# Patient Record
Sex: Female | Born: 1965 | Race: White | Hispanic: No | Marital: Single | Smoking: Never smoker
Health system: Southern US, Community
[De-identification: ages and names within clinical notes are randomized; demographics above are authoritative.]

## PROBLEM LIST (undated history)

## (undated) DIAGNOSIS — D72819 Decreased white blood cell count, unspecified: Secondary | ICD-10-CM

## (undated) DIAGNOSIS — D696 Thrombocytopenia, unspecified: Secondary | ICD-10-CM

## (undated) DIAGNOSIS — F988 Other specified behavioral and emotional disorders with onset usually occurring in childhood and adolescence: Secondary | ICD-10-CM

## (undated) DIAGNOSIS — F79 Unspecified intellectual disabilities: Secondary | ICD-10-CM

## (undated) DIAGNOSIS — E871 Hypo-osmolality and hyponatremia: Secondary | ICD-10-CM

## (undated) DIAGNOSIS — R569 Unspecified convulsions: Secondary | ICD-10-CM

## (undated) DIAGNOSIS — E669 Obesity, unspecified: Secondary | ICD-10-CM

## (undated) DIAGNOSIS — G40909 Epilepsy, unspecified, not intractable, without status epilepticus: Secondary | ICD-10-CM

## (undated) DIAGNOSIS — B351 Tinea unguium: Secondary | ICD-10-CM

## (undated) HISTORY — DX: Unspecified intellectual disabilities: F79

## (undated) HISTORY — DX: Thrombocytopenia, unspecified: D69.6

## (undated) HISTORY — DX: Tinea unguium: B35.1

## (undated) HISTORY — DX: Obesity, unspecified: E66.9

## (undated) HISTORY — DX: Decreased white blood cell count, unspecified: D72.819

## (undated) HISTORY — PX: VAGINAL HYSTERECTOMY: SHX2639

## (undated) HISTORY — PX: ABDOMINAL HYSTERECTOMY: SHX81

## (undated) HISTORY — DX: Epilepsy, unspecified, not intractable, without status epilepticus: G40.909

## (undated) HISTORY — DX: Hypo-osmolality and hyponatremia: E87.1

## (undated) HISTORY — DX: Other specified behavioral and emotional disorders with onset usually occurring in childhood and adolescence: F98.8

---

## 2003-04-07 ENCOUNTER — Inpatient Hospital Stay (HOSPITAL_COMMUNITY): Admission: EM | Admit: 2003-04-07 | Discharge: 2003-04-11 | Payer: Self-pay | Admitting: Internal Medicine

## 2006-06-13 ENCOUNTER — Ambulatory Visit: Payer: Self-pay | Admitting: *Deleted

## 2007-05-09 ENCOUNTER — Ambulatory Visit: Payer: Self-pay | Admitting: *Deleted

## 2008-05-30 ENCOUNTER — Ambulatory Visit: Payer: Self-pay | Admitting: *Deleted

## 2009-05-01 ENCOUNTER — Ambulatory Visit: Payer: Self-pay | Admitting: Dentistry

## 2009-05-08 ENCOUNTER — Ambulatory Visit: Payer: Self-pay | Admitting: Dentistry

## 2009-06-01 ENCOUNTER — Ambulatory Visit: Payer: Self-pay | Admitting: Family Medicine

## 2010-06-29 ENCOUNTER — Ambulatory Visit: Payer: Self-pay | Admitting: Family Medicine

## 2010-10-21 ENCOUNTER — Ambulatory Visit: Payer: Self-pay | Admitting: Family Medicine

## 2011-03-24 ENCOUNTER — Ambulatory Visit: Payer: Self-pay | Admitting: Family Medicine

## 2011-07-05 ENCOUNTER — Ambulatory Visit: Payer: Self-pay | Admitting: Family Medicine

## 2011-11-13 ENCOUNTER — Emergency Department: Payer: Self-pay | Admitting: Emergency Medicine

## 2011-11-13 LAB — CBC
HGB: 14.1 g/dL (ref 12.0–16.0)
MCH: 31.9 pg (ref 26.0–34.0)
MCHC: 34 g/dL (ref 32.0–36.0)
MCV: 94 fL (ref 80–100)
Platelet: 86 10*3/uL — ABNORMAL LOW (ref 150–440)
RBC: 4.42 10*6/uL (ref 3.80–5.20)

## 2011-11-13 LAB — COMPREHENSIVE METABOLIC PANEL
Albumin: 3.5 g/dL (ref 3.4–5.0)
Alkaline Phosphatase: 91 U/L (ref 50–136)
BUN: 17 mg/dL (ref 7–18)
Bilirubin,Total: 0.7 mg/dL (ref 0.2–1.0)
Chloride: 105 mmol/L (ref 98–107)
Co2: 26 mmol/L (ref 21–32)
EGFR (African American): >60
EGFR (Non-African Amer.): >60
Glucose: 96 mg/dL (ref 65–99)
Osmolality: 286 (ref 275–301)
SGOT(AST): 18 U/L (ref 15–37)
SGPT (ALT): 26 U/L
Total Protein: 6.4 g/dL (ref 6.4–8.2)

## 2011-11-14 LAB — URINALYSIS, COMPLETE
Bacteria: NONE SEEN
Bilirubin,UR: NEGATIVE
Ketone: NEGATIVE
Leukocyte Esterase: NEGATIVE
Ph: 5 (ref 4.5–8.0)
Protein: NEGATIVE
RBC,UR: NONE SEEN /HPF (ref 0–5)
WBC UR: 4 /HPF (ref 0–5)

## 2012-01-12 ENCOUNTER — Ambulatory Visit: Payer: Self-pay | Admitting: Dentistry

## 2012-07-06 ENCOUNTER — Ambulatory Visit: Payer: Self-pay | Admitting: Family Medicine

## 2013-07-08 ENCOUNTER — Ambulatory Visit: Payer: Self-pay | Admitting: Family Medicine

## 2013-07-15 ENCOUNTER — Encounter: Payer: Self-pay | Admitting: Nurse Practitioner

## 2013-07-22 ENCOUNTER — Ambulatory Visit (INDEPENDENT_AMBULATORY_CARE_PROVIDER_SITE_OTHER): Payer: Medicare Other | Admitting: Nurse Practitioner

## 2013-07-22 ENCOUNTER — Encounter: Payer: Self-pay | Admitting: Nurse Practitioner

## 2013-07-22 VITALS — BP 94/67 | HR 84 | Ht 60.0 in | Wt 140.0 lb

## 2013-07-22 DIAGNOSIS — G40319 Generalized idiopathic epilepsy and epileptic syndromes, intractable, without status epilepticus: Secondary | ICD-10-CM | POA: Insufficient documentation

## 2013-07-22 DIAGNOSIS — Z5181 Encounter for therapeutic drug level monitoring: Secondary | ICD-10-CM | POA: Insufficient documentation

## 2013-07-22 DIAGNOSIS — F79 Unspecified intellectual disabilities: Secondary | ICD-10-CM | POA: Insufficient documentation

## 2013-07-22 DIAGNOSIS — Z79899 Other long term (current) drug therapy: Secondary | ICD-10-CM

## 2013-07-22 NOTE — Progress Notes (Signed)
[facility]  PATIENT: Roberta Hampton DOB: 1966-05-06   REASON FOR VISIT: Followup for seizure disorder   HISTORY OF PRESENT ILLNESS: Roberta Hampton, 47 year old  right-handed white female with a history of mental retardation and seizures returns for followup. The patient has done quite well with the seizures, without any recurrence since last seen. The patient is on low-dose Depakene, and she is tolerating this quite well. She has not had recent labs according to the caregiver that is with her from the group home.  REVIEW OF SYSTEMS: Full 14 system review of systems performed and notable only for:  Constitutional: N/A  Cardiovascular: N/A  Ear/Nose/Throat: N/A  Skin: N/A  Eyes: N/A  Respiratory: N/A  Gastroitestinal: N/A  Hematology/Lymphatic: N/A  Endocrine: N/A Musculoskeletal:N/A  Allergy/Immunology: N/A  Neurological: N/A Psychiatric: N/A   ALLERGIES: No Known Allergies  HOME MEDICATIONS: Outpatient Prescriptions Prior to Visit  Medication Sig Dispense Refill  . ARIPiprazole (ABILIFY) 30 MG tablet Take 30 mg by mouth daily.      . Calcium Carb-Cholecalciferol (CALCIUM 600 + D PO) Take 600 mg by mouth 2 (two) times daily.      Marland Kitchen docusate (COLACE) 50 MG/5ML liquid Take 50 mg by mouth 2 (two) times daily. One teaspoon      . fluvoxaMINE (LUVOX) 50 MG tablet Take 50 mg by mouth at bedtime. 2 tablets      . ibuprofen (ADVIL,MOTRIN) 100 MG/5ML suspension Take 200 mg by mouth every 6 (six) hours as needed. 4 teaspoons as needed for joint pain      . loratadine (CLARITIN) 5 MG/5ML syrup Take by mouth daily. 2 teaspoons      . Multiple Vitamin (MULTIVITAMIN) tablet Take 1 tablet by mouth daily.      . mupirocin ointment (BACTROBAN) 2 % Apply 1 application topically. Apply 3 times for 10 days for infection      . polyethylene glycol (MIRALAX / GLYCOLAX) packet Take 17 g by mouth daily.      . QUEtiapine (SEROQUEL) 300 MG tablet Take 300 mg by mouth at bedtime. 2  tablets      . valproate (DEPAKENE) 250 MG/5ML syrup Take 250 mg by mouth 2 (two) times daily. 1 teaspoon       No facility-administered medications prior to visit.    PAST MEDICAL HISTORY: Past Medical History  Diagnosis Date  . Seizure disorder   . Hyponatremia     associated with polydipsia  . Obesity   . Mental retardation   . Attention deficit disorder     PAST SURGICAL HISTORY: Past Surgical History  Procedure Laterality Date  . Vaginal hysterectomy      FAMILY HISTORY: Family History  Problem Relation Age of Onset  . Asthma Brother   . Seizures      paternal cousin    SOCIAL HISTORY: History   Social History  . Marital Status: Single    Spouse Name: N/A    Number of Children: 0  . Years of Education: N/A   Occupational History  . Not on file.   Social History Main Topics  . Smoking status: Never Smoker   . Smokeless tobacco: Never Used  . Alcohol Use: No  . Drug Use: No  . Sexual Activity: Not on file   Other Topics Concern  . Not on file   Social History Narrative   Patient is single and lives in a group home.   Patient does not have any children.  PHYSICAL EXAM  Filed Vitals:   07/22/13 1039  BP: 94/67  Pulse: 84  Height: 5' (1.524 m)  Weight: 140 lb (63.504 kg)   Body mass index is 27.34 kg/(m^2).  Generalized: Well developed, in no acute distress   Neurological examination   Mentation: Alert  MR.  Speech is slightly dysarthric  Cranial nerve II-XII: Visual fields were full on confrontational test. Extraocular movements are full Facial sensation and strength were normal. hearing was intact to finger rubbing bilaterally. Uvula tongue midline. head turning and shoulder shrug and were normal and symmetric. Motor: normal bulk and tone, full strength in the BUE, BLE, Coordination: finger-nose-finger, heel-to-shin bilaterally, the patient does have some apraxia with the lower extremities  Reflexes: Brachioradialis 2/2, biceps 2/2,  triceps 2/2, patellar 2/2, Achilles 2/2, plantar responses were flexor bilaterally. Gait and Station: Rising up from seated position without assistance, wide based stance,  moderate stride, good arm swing, smooth turning, un able to perform tiptoe, and heel walking without difficulty. Tandem gait ataxic  DIAGNOSTIC DATA (LABS, IMAGING, TESTING) None to review    ASSESSMENT AND PLAN  47 y.o. year old female  has a past medical history of Seizure disorder;   Mental retardation; and Attention deficit disorder. here to followup for seizure disorder. She is currently on Depakene with no seizures in several years  Paperwork for group home completed CBC, CMP today Depakene refills for one year per there form Followup yearly  and when necessary Roberta Hampton, [facility], [facility], APRN  [facility] [address], [address] [place], [place] [address] [phone]

## 2013-07-22 NOTE — Progress Notes (Signed)
I have read the note, and I agree with the clinical assessment and plan.  WILLIS,CHARLES KEITH   

## 2013-07-22 NOTE — Patient Instructions (Signed)
Per group home sheet 

## 2013-07-23 LAB — CBC WITH DIFFERENTIAL/PLATELET
Basophils Absolute: 0 10*3/uL (ref 0.0–0.2)
Basos: 1 %
Eosinophils Absolute: 0.1 10*3/uL (ref 0.0–0.4)
HCT: 44.1 % (ref 34.0–46.6)
Hemoglobin: 15.4 g/dL (ref 11.1–15.9)
Lymphocytes Absolute: 2.2 10*3/uL (ref 0.7–3.1)
Lymphs: 37 %
MCH: 31.6 pg (ref 26.6–33.0)
MCHC: 34.9 g/dL (ref 31.5–35.7)
MCV: 90 fL (ref 79–97)
Monocytes Absolute: 0.5 10*3/uL (ref 0.1–0.9)
Monocytes: 9 %
RDW: 12.9 % (ref 12.3–15.4)
WBC: 5.9 10*3/uL (ref 3.4–10.8)

## 2013-07-23 LAB — COMPREHENSIVE METABOLIC PANEL
ALT: 8 IU/L (ref 0–32)
AST: 11 IU/L (ref 0–40)
BUN/Creatinine Ratio: 28 — ABNORMAL HIGH (ref 9–23)
CO2: 35 mmol/L — ABNORMAL HIGH (ref 18–29)
Calcium: 10.3 mg/dL — ABNORMAL HIGH (ref 8.7–10.2)
Chloride: 100 mmol/L (ref 96–108)
Creatinine, Ser: 0.69 mg/dL (ref 0.57–1.00)
Glucose: 71 mg/dL (ref 65–99)
Potassium: 4.2 mmol/L (ref 3.5–5.2)
Sodium: 142 mmol/L (ref 134–144)
Total Bilirubin: 0.6 mg/dL (ref 0.0–1.2)
Total Protein: 6.1 g/dL (ref 6.0–8.5)

## 2013-07-23 NOTE — Progress Notes (Signed)
Quick Note:  Spoke to [place] at group home and gave normal labs, per [place]. ______

## 2014-01-15 ENCOUNTER — Emergency Department: Payer: Self-pay | Admitting: Emergency Medicine

## 2014-03-05 ENCOUNTER — Emergency Department: Payer: Self-pay | Admitting: Emergency Medicine

## 2014-07-09 ENCOUNTER — Ambulatory Visit: Payer: Self-pay

## 2014-07-21 ENCOUNTER — Ambulatory Visit (INDEPENDENT_AMBULATORY_CARE_PROVIDER_SITE_OTHER): Payer: Medicare Other | Admitting: Neurology

## 2014-07-21 ENCOUNTER — Encounter: Payer: Self-pay | Admitting: Neurology

## 2014-07-21 VITALS — BP 110/80 | HR 90 | Wt 152.0 lb

## 2014-07-21 DIAGNOSIS — G40311 Generalized idiopathic epilepsy and epileptic syndromes, intractable, with status epilepticus: Secondary | ICD-10-CM

## 2014-07-21 DIAGNOSIS — F79 Unspecified intellectual disabilities: Secondary | ICD-10-CM

## 2014-07-21 DIAGNOSIS — G40319 Generalized idiopathic epilepsy and epileptic syndromes, intractable, without status epilepticus: Secondary | ICD-10-CM

## 2014-07-21 DIAGNOSIS — Z5181 Encounter for therapeutic drug level monitoring: Secondary | ICD-10-CM

## 2014-07-21 MED ORDER — VALPROATE SODIUM 250 MG/5ML PO SYRP: 250.0000 mg | ORAL_SOLUTION | Freq: Two times a day (BID) | ORAL | Status: DC

## 2014-07-21 NOTE — Progress Notes (Signed)
Reason for visit: seizures  Roberta Hampton is an 48 y.o. female  History of present illness:  Roberta Hampton is a 48 year old right-handed white female with history of mental retardation and epilepsy. The patient is on Depakene, and she is tolerating medication well. The patient has not had any noted seizures since last seen one year ago. The patient does have some mild gait instability, and she may fall on occasion. The patient otherwise is eating and sleeping fairly well. She has not had any other significant medical issues that have come up since last seen. She returns for routine reevaluation.  Past Medical History  Diagnosis Date  . Seizure disorder   . Hyponatremia     associated with polydipsia  . Obesity   . Mental retardation   . Attention deficit disorder     Past Surgical History  Procedure Laterality Date  . Vaginal hysterectomy      Family History  Problem Relation Age of Onset  . Asthma Brother   . Seizures      paternal cousin    Social history:  reports that she has never smoked. She has never used smokeless tobacco. She reports that she does not drink alcohol or use illicit drugs.   No Known Allergies  Medications:  Current Outpatient Prescriptions on File Prior to Visit  Medication Sig Dispense Refill  . acetaminophen (TYLENOL) 160 MG/5ML elixir Take 15 mg/kg by mouth every 4 (four) hours as needed for fever (4 teaspoons every 6 hours as needed).    . Calcium Carb-Cholecalciferol (CALCIUM 600 + D PO) Take 600 mg by mouth 2 (two) times daily.    Marland Kitchen docusate (COLACE) 50 MG/5ML liquid Take 50 mg by mouth 2 (two) times daily. One teaspoon    . ibuprofen (ADVIL,MOTRIN) 100 MG/5ML suspension Take 200 mg by mouth every 6 (six) hours as needed. 4 teaspoons as needed for joint pain    . lisdexamfetamine (VYVANSE) 60 MG capsule Take 60 mg by mouth every morning.    . loratadine (CLARITIN) 5 MG/5ML syrup Take by mouth daily. 2 teaspoons    . Multiple Vitamin  (MULTIVITAMIN) tablet Take 1 tablet by mouth daily.    . polyethylene glycol (MIRALAX / GLYCOLAX) packet Take 17 g by mouth daily.    . QUEtiapine (SEROQUEL) 300 MG tablet Take 300 mg by mouth at bedtime. 2 tablets     No current facility-administered medications on file prior to visit.    ROS:  Out of a complete 14 system review of symptoms, the patient complains only of the following symptoms, and all other reviewed systems are negative.  Behavior problems, agitation History of seizures Runny nose, difficulty swallowing  Blood pressure 110/80, pulse 90, weight 152 lb (68.947 kg).  Physical Exam  General: The patient is alert and cooperative at the time of the examination.  Skin: No significant peripheral edema is noted.   Neurologic Exam  Mental status: The patient is oriented x 3.  Cranial nerves: Facial symmetry is present. Speech is normal, no aphasia or dysarthria is noted. Extraocular movements are full. Visual fields are full to threat.  Motor: The patient has good strength in all 4 extremities.  Sensory examination: Sensory testing is difficult, the patient does not respond appropriately.  Coordination: The patient has no obvious dysmetria with the extremities, she will not follow commands well for finger-nose-finger and heel-to-shin testing.  Gait and station: The patient has a slightly wide-based gait.  Reflexes: Deep tendon reflexes are  symmetric.   Assessment/Plan:  1. History seizures  2. Mental retardation  The patient is doing fairly well with her seizures. She will continue the Depakene. We will check blood work today, and she will follow-up in one year. A prescription was sent in for the Depakene.  Jill Alexanders MD 07/21/2014 10:09 AM  [facility] [address] [place], [place] [address]  Phone [phone] Fax [phone]

## 2014-07-21 NOTE — Addendum Note (Signed)
Addended by: Lolita Cram T on: 07/21/2014 10:47 AM   Modules accepted: Medications

## 2014-07-22 ENCOUNTER — Telehealth: Payer: Self-pay | Admitting: Neurology

## 2014-07-22 LAB — COMPREHENSIVE METABOLIC PANEL
ALBUMIN: 4.4 g/dL (ref 3.5–5.5)
ALK PHOS: 79 IU/L (ref 39–117)
ALT: 8 IU/L (ref 0–32)
AST: 11 IU/L (ref 0–40)
Albumin/Globulin Ratio: 2.8 — ABNORMAL HIGH (ref 1.1–2.5)
BILIRUBIN TOTAL: 0.7 mg/dL (ref 0.0–1.2)
BUN / CREAT RATIO: 27 — AB (ref 9–23)
BUN: 21 mg/dL (ref 6–24)
CHLORIDE: 98 mmol/L (ref 97–108)
CO2: 28 mmol/L (ref 18–29)
Calcium: 10.1 mg/dL (ref 8.7–10.2)
Creatinine, Ser: 0.77 mg/dL (ref 0.57–1.00)
GFR calc Af Amer: 106 mL/min/{1.73_m2} (ref >59)
GFR calc non Af Amer: 92 mL/min/{1.73_m2} (ref >59)
Globulin, Total: 1.6 g/dL (ref 1.5–4.5)
Glucose: 64 mg/dL — ABNORMAL LOW (ref 65–99)
Potassium: 4.2 mmol/L (ref 3.5–5.2)
SODIUM: 142 mmol/L (ref 134–144)
Total Protein: 6 g/dL (ref 6.0–8.5)

## 2014-07-22 LAB — CBC WITH DIFFERENTIAL
BASOS ABS: 0 10*3/uL (ref 0.0–0.2)
Basos: 0 %
Eos: 1 %
Eosinophils Absolute: 0.1 10*3/uL (ref 0.0–0.4)
HCT: 48.3 % — ABNORMAL HIGH (ref 34.0–46.6)
Hemoglobin: 16.3 g/dL — ABNORMAL HIGH (ref 11.1–15.9)
IMMATURE GRANULOCYTES: 0 %
Immature Grans (Abs): 0 10*3/uL (ref 0.0–0.1)
Lymphocytes Absolute: 1.7 10*3/uL (ref 0.7–3.1)
Lymphs: 29 %
MCH: 31.7 pg (ref 26.6–33.0)
MCHC: 33.7 g/dL (ref 31.5–35.7)
MCV: 94 fL (ref 79–97)
MONOCYTES: 7 %
MONOS ABS: 0.4 10*3/uL (ref 0.1–0.9)
Neutrophils Absolute: 3.5 10*3/uL (ref 1.4–7.0)
Neutrophils Relative %: 63 %
PLATELETS: 122 10*3/uL — AB (ref 150–379)
RBC: 5.14 x10E6/uL (ref 3.77–5.28)
RDW: 13.6 % (ref 12.3–15.4)
WBC: 5.8 10*3/uL (ref 3.4–10.8)

## 2014-07-22 LAB — VALPROIC ACID LEVEL: VALPROIC ACID LVL: 109 ug/mL — AB (ref 50–100)

## 2014-07-22 NOTE — Telephone Encounter (Signed)
I called concerning the blood work. The blood sugar was minimally low, platelets were slightly low, hemoglobin is slightly high. The patient has a good Depakote level, overall, the blood work shows no severe abnormalities.

## 2014-10-24 ENCOUNTER — Emergency Department: Payer: Self-pay | Admitting: Emergency Medicine

## 2014-10-27 ENCOUNTER — Telehealth: Payer: Self-pay | Admitting: *Deleted

## 2014-10-27 DIAGNOSIS — Z5181 Encounter for therapeutic drug level monitoring: Secondary | ICD-10-CM

## 2014-10-27 NOTE — Telephone Encounter (Signed)
Events noted, I will see the patient tomorrow.

## 2014-10-27 NOTE — Telephone Encounter (Signed)
Alcorya with the patients group home is calling stating that the patient been having seizures. The patient has not had any seizures in a long time but it just started back. The would like to know should the patient come in for an appt of adjust meds. Please call [phone]..... Willis out today

## 2014-10-27 NOTE — Telephone Encounter (Signed)
Ms. Roberta Hampton is calling back stating that we need to fax a authoraztion form to [facility] to get labs results.  Their fax number  Is [phone].

## 2014-10-27 NOTE — Telephone Encounter (Signed)
Faxed form to [facility]. Waiting on results. Informed Ms. Jeneen Rinks per NP-Carolyn patient will see Dr. Jannifer Franklin on 10/28/14. Ms. Jeneen Rinks agreed.

## 2014-10-27 NOTE — Telephone Encounter (Signed)
Please call group home and ask that we check level of Depakene first thing in the morning before morning dose. I am assuming they have already ruled out UTI or other type of infection with PCP. Orders in the system.

## 2014-10-27 NOTE — Telephone Encounter (Signed)
Roberta Hampton said she took patient to ED @ [facility] on 10/24/14 her Depakene levels were good. They advised patient to f/u with PCP or Neurologist. PCP advised f/u with neurologist. Julius Bowels with have [place] to fax lab results. Please advise.

## 2014-10-28 ENCOUNTER — Ambulatory Visit (INDEPENDENT_AMBULATORY_CARE_PROVIDER_SITE_OTHER): Payer: Medicare Other | Admitting: Neurology

## 2014-10-28 ENCOUNTER — Telehealth: Payer: Self-pay | Admitting: Neurology

## 2014-10-28 ENCOUNTER — Encounter: Payer: Self-pay | Admitting: Neurology

## 2014-10-28 VITALS — BP 93/71 | HR 92 | Ht 60.0 in | Wt 159.4 lb

## 2014-10-28 DIAGNOSIS — F79 Unspecified intellectual disabilities: Secondary | ICD-10-CM | POA: Diagnosis not present

## 2014-10-28 DIAGNOSIS — G40311 Generalized idiopathic epilepsy and epileptic syndromes, intractable, with status epilepticus: Secondary | ICD-10-CM | POA: Diagnosis not present

## 2014-10-28 DIAGNOSIS — G40319 Generalized idiopathic epilepsy and epileptic syndromes, intractable, without status epilepticus: Secondary | ICD-10-CM

## 2014-10-28 MED ORDER — VALPROATE SODIUM 250 MG/5ML PO SYRP: 375.0000 mg | ORAL_SOLUTION | Freq: Two times a day (BID) | ORAL | Status: DC

## 2014-10-28 NOTE — Telephone Encounter (Signed)
Roberta Hampton with [employer] @ [phone], questioning if dosage has been increased for Rx valproate (DEPAKENE) 250 MG/5ML syrup.  If so need DC with previous order.  Please call and advise.

## 2014-10-28 NOTE — Patient Instructions (Signed)

## 2014-10-28 NOTE — Progress Notes (Signed)
Reason for visit: Seizures  DAI APEL is an 49 y.o. female  History of present illness:  Ms. Verlene Mayer is a 49 year old right-handed white female with a history of mental retardation and seizures. She has done well for a number of years without recurring seizures. The patient unfortunately has had several seizures that occurred on February 19 and on February 20. The patient has staring episodes associated with her seizures associated with some slumping. She had a recent Depakote level that was done at 11:30 AM following her morning dose of medication with a level of 65. Normally she runs a level of around 110. The patient is back to her baseline, she comes to the office today for an evaluation.  Past Medical History  Diagnosis Date  . Seizure disorder   . Hyponatremia     associated with polydipsia  . Obesity   . Mental retardation   . Attention deficit disorder     Past Surgical History  Procedure Laterality Date  . Vaginal hysterectomy      Family History  Problem Relation Age of Onset  . Asthma Brother   . Seizures      paternal cousin    Social history:  reports that she has never smoked. She has never used smokeless tobacco. She reports that she does not drink alcohol or use illicit drugs.   No Known Allergies  Medications:  Current Outpatient Prescriptions on File Prior to Visit  Medication Sig Dispense Refill  . acetaminophen (TYLENOL) 160 MG/5ML elixir Take 15 mg/kg by mouth every 4 (four) hours as needed for fever (4 teaspoons every 6 hours as needed).    Marland Kitchen antipyrine-benzocaine (AURALGAN) otic solution Place 3-4 drops into the left ear every 2 (two) hours as needed for ear pain.    . Calcium Carb-Cholecalciferol (CALCIUM 600 + D PO) Take 600 mg by mouth 2 (two) times daily.    Marland Kitchen docusate (COLACE) 50 MG/5ML liquid Take 50 mg by mouth 2 (two) times daily. One teaspoon    . fluvoxaMINE (LUVOX) 25 MG tablet Take 25 mg by mouth at bedtime.    Marland Kitchen lisdexamfetamine  (VYVANSE) 60 MG capsule Take 60 mg by mouth every morning.    . loratadine (CLARITIN) 5 MG/5ML syrup Take by mouth daily. 2 teaspoons    . Multiple Vitamin (MULTIVITAMIN) tablet Take 1 tablet by mouth daily.    . polyethylene glycol (MIRALAX / GLYCOLAX) packet Take 17 g by mouth daily.    . QUEtiapine (SEROQUEL) 400 MG tablet Take 400 mg by mouth 2 (two) times daily.    . risperiDONE (RISPERDAL) 2 MG tablet Take 2 mg by mouth 2 (two) times daily.    . vitamin E 200 UNIT capsule Take 200 Units by mouth daily.     No current facility-administered medications on file prior to visit.    ROS:  Out of a complete 14 system review of symptoms, the patient complains only of the following symptoms, and all other reviewed systems are negative.  Seizures Agitation, behavior problems, hyperactivity  Blood pressure 93/71, pulse 92, height 5' (1.524 m), weight 159 lb 6.4 oz (72.303 kg).  Physical Exam  General: The patient is alert and cooperative at the time of the examination. The patient is minimally obese.  Skin: No significant peripheral edema is noted.   Neurologic Exam  Mental status: The patient is alert and cooperative.  Cranial nerves: Facial symmetry is present. Speech is normal, no aphasia or dysarthria is noted. Extraocular  movements are full. Visual fields are full.  Motor: The patient has good strength in all 4 extremities.  Sensory examination:  Soft touch sensation is symmetric on the face, arms, and legs.  Coordination: The patient has good finger-nose-finger and heel-to-shin bilaterally.  Gait and station: The patient has a normal gait.   Reflexes: Deep tendon reflexes are symmetric.   Assessment/Plan:  1. History of seizures, recent recurrence  2. Mental retardation  The patient is on Depakene taking 250 mg twice daily. This dose was reduced at some point in the last year or so. The patient normally runs Depakote levels of around 110, her level recently was 52.  The patient will go up to a 375 milligrams twice daily dose. She will follow-up in about 4 months, we will recheck levels then, she will need a level sooner if the seizures continue.  Jill Alexanders MD 10/28/2014 8:45 PM  [facility] [address] [place], [place] [address]  Phone [phone] Fax [phone]

## 2014-10-28 NOTE — Telephone Encounter (Signed)
OV note says: The patient is on Depakene taking 250 mg twice daily. This dose was reduced at some point in the last year or so. The patient normally runs Depakote levels of around 110, her level recently was 84. The patient will go up to a 375 milligrams twice daily dose I called back.  Was transferred to [facility].  I verified the dose has been changed, and they will d/c Rx for previous dose.

## 2014-12-28 NOTE — Op Note (Signed)
PATIENT NAME:  Roberta Hampton, Roberta Hampton MR#:  [id number] DATE OF BIRTH:  [birth year]  DATE OF PROCEDURE:  01/12/2012  PREOPERATIVE DIAGNOSIS:  Severe chronic gingivitis and periodontitis.  POSTOPERATIVE DIAGNOSIS: Severe chronic gingivitis and periodontitis.    PROCEDURE:  Removal of all remaining teeth, alveoloplasty.  SURGEON:  Haydee Monica. Tessie Ordaz, DDS  DESCRIPTION OF PROCEDURE:  Teeth #4, 5, 6, 7, and 8 were extracted using a buccal and lingual flap. Also, performed an alveoloplasty #4-8 and placed four gut sutures. All bleeding stopped. Teeth #9, 10, 11, 12, 14, and 15 extracted. Raised the buccal and lingual flap. Performed an alveoloplasty teeth numbers 9 through 15. Three gut sutures placed. The patient has severe chronic gingivitis and periodontitis. No sutures were placed in the #14-15 area. No soft tissue was remaining after debridement and the area was left open. All bleeding stopped. Blood loss approximately 250 mL. Teeth numbers 18 and 19 were extracted. Buccal and lingual flap raised. Alveoloplasty performed. Two sutures placed. All bleeding stopped. Teeth numbers 29, 30 and 31 were extracted. Buccal and lingual flap were raised. Alveoloplasty performed. Three gut sutures placed. All bleeding had stopped. We used 1.8 mL of Vivicaine 0.5% 1:200,000 epinephrine on the lower left side and the lower right side for postoperative anesthesia. The patient tolerated the procedure well. We placed the patient on a liquid diet x2 days and then soft foods as tolerated after that. Advised the patient and family to use Advil liquids 600 mg q.6 hours p.r.n. pain x5 days. Also administered three grams of ampicillin via IV drip postoperative. Again, the patient tolerated the procedure well. Next visit was to be arranged in approximately two weeks.  ____________________________ Haydee Monica. Sheppard Coil, DDS afa:ap D: 01/13/2012 09:34:00 ET T: 01/13/2012 11:42:41 ET JOB#: [id number]  cc: Haydee Monica. Sheppard Coil, DDS,  <Dictator> Molly Maduro DDS ELECTRONICALLY SIGNED 01/19/2012 10:50

## 2015-02-26 ENCOUNTER — Ambulatory Visit (INDEPENDENT_AMBULATORY_CARE_PROVIDER_SITE_OTHER): Payer: Medicare Other | Admitting: Nurse Practitioner

## 2015-02-26 ENCOUNTER — Encounter: Payer: Self-pay | Admitting: Nurse Practitioner

## 2015-02-26 VITALS — BP 105/72 | HR 85 | Wt 156.2 lb

## 2015-02-26 DIAGNOSIS — F79 Unspecified intellectual disabilities: Secondary | ICD-10-CM

## 2015-02-26 DIAGNOSIS — G40311 Generalized idiopathic epilepsy and epileptic syndromes, intractable, with status epilepticus: Secondary | ICD-10-CM

## 2015-02-26 DIAGNOSIS — Z5181 Encounter for therapeutic drug level monitoring: Secondary | ICD-10-CM | POA: Diagnosis not present

## 2015-02-26 DIAGNOSIS — G40319 Generalized idiopathic epilepsy and epileptic syndromes, intractable, without status epilepticus: Secondary | ICD-10-CM

## 2015-02-26 NOTE — Progress Notes (Signed)
[facility]  PATIENT: Roberta Hampton DOB: 1965/10/08   REASON FOR VISIT: Follow-up for seizure disorder, MR HISTORY FROM: Caregiver from group home    HISTORY OF PRESENT ILLNESS:Roberta Hampton is a 49 year old right-handed white female with a history of mental retardation and seizures. She was last seen in the office 10/28/2014 by Dr. Jannifer [place] She has done well for a number of years without recurring seizures. The patient unfortunately has had several seizures that occurred on February 19 and on October 25, 2014. The patient has staring episodes associated with her seizures associated with some slumping. She had a recent Depakote level that was done at 11:30 AM following her morning dose of medication with a level of 65. Normally she runs a level of around 110. The patient is back to her baseline, her Depakene dose was increased to 375 twice a day at her last visit. She returns today for labs. She has not had further seizure activity.    REVIEW OF SYSTEMS: Full 14 system review of systems performed and notable only for those listed, all others are neg:  Constitutional: neg  Cardiovascular: neg Ear/Nose/Throat: neg  Skin: neg Eyes: neg Respiratory: neg Gastroitestinal: neg  Hematology/Lymphatic: neg  Endocrine: Excessive eating, excessive thirst Musculoskeletal:neg Allergy/Immunology: neg Neurological: neg Psychiatric: Behavior problems Sleep : neg   ALLERGIES: No Known Allergies  HOME MEDICATIONS: Outpatient Prescriptions Prior to Visit  Medication Sig Dispense Refill  . acetaminophen (TYLENOL) 160 MG/5ML elixir Take 15 mg/kg by mouth every 4 (four) hours as needed for fever (4 teaspoons every 6 hours as needed).    Marland Kitchen antipyrine-benzocaine (AURALGAN) otic solution Place 3-4 drops into the left ear every 2 (two) hours as needed for ear pain.    . Calcium Carb-Cholecalciferol (CALCIUM 600 + D PO) Take 600 mg by mouth 2 (two) times daily.    Marland Kitchen docusate  (COLACE) 50 MG/5ML liquid Take 50 mg by mouth 2 (two) times daily. One teaspoon    . fluvoxaMINE (LUVOX) 25 MG tablet Take 25 mg by mouth at bedtime.    Marland Kitchen lisdexamfetamine (VYVANSE) 60 MG capsule Take 60 mg by mouth every morning.    . loratadine (CLARITIN) 5 MG/5ML syrup Take by mouth daily. 2 teaspoons    . Multiple Vitamin (MULTIVITAMIN) tablet Take 1 tablet by mouth daily.    . polyethylene glycol (MIRALAX / GLYCOLAX) packet Take 17 g by mouth daily.    . QUEtiapine (SEROQUEL) 400 MG tablet Take 400 mg by mouth 2 (two) times daily.    . risperiDONE (RISPERDAL) 2 MG tablet Take 2 mg by mouth 2 (two) times daily.    Marland Kitchen valproate (DEPAKENE) 250 MG/5ML syrup Take 7.5 mLs (375 mg total) by mouth 2 (two) times daily. 1 teaspoon 450 mL 5  . vitamin E 200 UNIT capsule Take 200 Units by mouth daily.     No facility-administered medications prior to visit.    PAST MEDICAL HISTORY: Past Medical History  Diagnosis Date  . Seizure disorder   . Hyponatremia     associated with polydipsia  . Obesity   . Mental retardation   . Attention deficit disorder     PAST SURGICAL HISTORY: Past Surgical History  Procedure Laterality Date  . Vaginal hysterectomy      FAMILY HISTORY: Family History  Problem Relation Age of Onset  . Asthma Brother   . Seizures      paternal cousin    SOCIAL HISTORY: History   Social History  .  Marital Status: Single    Spouse Name: N/A  . Number of Children: 0  . Years of Education: N/A   Occupational History  . Not on file.   Social History Main Topics  . Smoking status: Never Smoker   . Smokeless tobacco: Never Used  . Alcohol Use: No  . Drug Use: No  . Sexual Activity: Not on file   Other Topics Concern  . Not on file   Social History Narrative   Patient is single and lives in a group home.   Patient does not have any children.   Patient is right handed.   Patient drinks caffeine daily.     PHYSICAL EXAM  Filed Vitals:   02/26/15 1030   BP: 105/72  Pulse: 85  Weight: 156 lb 3.2 oz (70.852 kg)   Body mass index is 30.51 kg/(m^2). Generalized: Well developed, minimally obese female in no acute distress   Neurological examination   Mentation: Alert MR. Speech is slightly dysarthric  Cranial nerve II-XII: Visual fields were full on confrontational test. Extraocular movements are full Facial sensation and strength were normal. hearing was intact to finger rubbing bilaterally. Uvula tongue midline. head turning and shoulder shrug and were normal and symmetric. Motor: normal bulk and tone, full strength in the BUE, BLE, Sensory withdraws to pain Coordination: finger-nose-finger, heel-to-shin bilaterally, the patient does have some apraxia with the lower extremities  Reflexes: Brachioradialis 2/2, biceps 2/2, triceps 2/2, patellar 2/2, Achilles 2/2, plantar responses were flexor bilaterally. Gait and Station: Rising up from seated position with assistance, wide based stance, moderate stride, DIAGNOSTIC DATA (LABS, IMAGING, TESTING) - I reviewed patient records, labs, notes, testing and imaging myself where available.  Lab Results  Component Value Date   WBC 5.8 07/21/2014   HGB 16.3* 07/21/2014   HCT 48.3* 07/21/2014   MCV 94 07/21/2014   PLT 122* 07/21/2014      Component Value Date/Time   NA 142 07/21/2014 1011   NA 143 11/13/2011 2004   K 4.2 07/21/2014 1011   K 3.8 11/13/2011 2004   CL 98 07/21/2014 1011   CL 105 11/13/2011 2004   CO2 28 07/21/2014 1011   CO2 26 11/13/2011 2004   GLUCOSE 64* 07/21/2014 1011   GLUCOSE 96 11/13/2011 2004   BUN 21 07/21/2014 1011   BUN 17 11/13/2011 2004   CREATININE 0.77 07/21/2014 1011   CREATININE 0.58* 11/13/2011 2004   CALCIUM 10.1 07/21/2014 1011   CALCIUM 9.0 11/13/2011 2004   PROT 6.0 07/21/2014 1011   PROT 6.4 11/13/2011 2004   ALBUMIN 3.5 11/13/2011 2004   AST 11 07/21/2014 1011   AST 18 11/13/2011 2004   ALT 8 07/21/2014 1011   ALT 26 11/13/2011 2004    ALKPHOS 79 07/21/2014 1011   ALKPHOS 91 11/13/2011 2004   BILITOT 0.7 07/21/2014 1011   GFRNONAA 92 07/21/2014 1011   GFRAA 106 07/21/2014 1011    ASSESSMENT AND PLAN  49 y.o. year old female  has a past medical history of Seizure disorder; Hyponatremia; Obesity; Mental retardation; and Attention deficit disorder. here to follow-up for her seizure disorder. Her Depakene was increased at her last visit and she needs labs today  Continue Depakene 375 mg twice daily for now Obtain VPA level today Follow-up 6 months to 1 year Call for any seizure activity Dennie Bible, [facility], [facility], APRN  [facility] [address] [place], [place] [address] [phone]

## 2015-02-26 NOTE — Patient Instructions (Signed)
Per group home sheet 

## 2015-02-27 ENCOUNTER — Telehealth: Payer: Self-pay | Admitting: *Deleted

## 2015-02-27 ENCOUNTER — Other Ambulatory Visit: Payer: Self-pay | Admitting: Nurse Practitioner

## 2015-02-27 DIAGNOSIS — G40319 Generalized idiopathic epilepsy and epileptic syndromes, intractable, without status epilepticus: Secondary | ICD-10-CM

## 2015-02-27 LAB — VALPROIC ACID LEVEL: Valproic Acid Lvl: 72 ug/mL (ref 50–100)

## 2015-02-27 MED ORDER — VALPROATE SODIUM 250 MG/5ML PO SYRP: 500.0000 mg | ORAL_SOLUTION | Freq: Two times a day (BID) | ORAL | Status: DC

## 2015-02-27 NOTE — Telephone Encounter (Signed)
I faxed to [facility] carr's attention at [facility] that valproic acid level 72, per carolyn martin. NP to increase depakene to 500mg  po bid.  Rx has been renewed and to repeat VPA level in one month.

## 2015-02-27 NOTE — Progress Notes (Signed)
I have read the note, and I agree with the clinical assessment and plan.  WILLIS,CHARLES KEITH   

## 2015-04-01 ENCOUNTER — Other Ambulatory Visit (INDEPENDENT_AMBULATORY_CARE_PROVIDER_SITE_OTHER): Payer: Self-pay

## 2015-04-01 DIAGNOSIS — Z5181 Encounter for therapeutic drug level monitoring: Secondary | ICD-10-CM

## 2015-04-01 DIAGNOSIS — Z0289 Encounter for other administrative examinations: Secondary | ICD-10-CM

## 2015-04-02 ENCOUNTER — Telehealth: Payer: Self-pay | Admitting: *Deleted

## 2015-04-02 LAB — VALPROIC ACID LEVEL: VALPROIC ACID LVL: 146 ug/mL — AB (ref 50–100)

## 2015-04-02 NOTE — Telephone Encounter (Signed)
No seizure activity. Will continue same dose. Fax order to group home

## 2015-04-02 NOTE — Telephone Encounter (Signed)
I called and spoke to operator and got fax # [phone].  Pts coordinator is [place].  I LMVM for her to call back relating to any seizure activity and depending on this will then make decision on her medication dosing.  I told her in VM that are phones go off 1700 today and we are not available tomorrow.

## 2015-04-02 NOTE — Telephone Encounter (Signed)
LMVM for [place], to call back.

## 2015-04-02 NOTE — Telephone Encounter (Signed)
Done

## 2015-04-02 NOTE — Telephone Encounter (Signed)
I called and LMVM for [place] to call me back about pt (if she has had any seizure activity).  Confirm fax # .

## 2015-04-03 ENCOUNTER — Telehealth: Payer: Self-pay | Admitting: Nurse Practitioner

## 2015-04-03 NOTE — Telephone Encounter (Signed)
Roberta Hampton  services, called back about pts  lab work. Please call and advise [phone]

## 2015-04-06 NOTE — Telephone Encounter (Signed)
I called back and asked for [place] re: lab results.

## 2015-04-07 NOTE — Telephone Encounter (Signed)
I faxed order previously.   I have not heard back from my previous message.

## 2015-05-08 ENCOUNTER — Inpatient Hospital Stay: Payer: Medicare Other

## 2015-05-08 ENCOUNTER — Emergency Department: Payer: Medicare Other

## 2015-05-08 ENCOUNTER — Encounter
Admission: EM | Disposition: A | Discharge status: Skilled Nursing Facility | Payer: Self-pay | Source: Home / Self Care | Attending: Surgery

## 2015-05-08 ENCOUNTER — Inpatient Hospital Stay: Payer: Medicare Other | Admitting: Registered Nurse

## 2015-05-08 ENCOUNTER — Inpatient Hospital Stay
Admission: EM | Admit: 2015-05-08 | Discharge: 2015-05-11 | DRG: 494 | Disposition: A | Discharge status: Skilled Nursing Facility | Payer: Medicare Other | Attending: Surgery | Admitting: Surgery

## 2015-05-08 ENCOUNTER — Encounter: Payer: Self-pay | Admitting: Emergency Medicine

## 2015-05-08 DIAGNOSIS — F319 Bipolar disorder, unspecified: Secondary | ICD-10-CM | POA: Diagnosis present

## 2015-05-08 DIAGNOSIS — Y92199 Unspecified place in other specified residential institution as the place of occurrence of the external cause: Secondary | ICD-10-CM

## 2015-05-08 DIAGNOSIS — X58XXXA Exposure to other specified factors, initial encounter: Secondary | ICD-10-CM | POA: Diagnosis present

## 2015-05-08 DIAGNOSIS — G40409 Other generalized epilepsy and epileptic syndromes, not intractable, without status epilepticus: Secondary | ICD-10-CM | POA: Diagnosis present

## 2015-05-08 DIAGNOSIS — Z6832 Body mass index (BMI) 32.0-32.9, adult: Secondary | ICD-10-CM | POA: Diagnosis not present

## 2015-05-08 DIAGNOSIS — S82842A Displaced bimalleolar fracture of left lower leg, initial encounter for closed fracture: Secondary | ICD-10-CM | POA: Diagnosis present

## 2015-05-08 DIAGNOSIS — F79 Unspecified intellectual disabilities: Secondary | ICD-10-CM | POA: Diagnosis present

## 2015-05-08 DIAGNOSIS — E669 Obesity, unspecified: Secondary | ICD-10-CM | POA: Diagnosis present

## 2015-05-08 DIAGNOSIS — S82899A Other fracture of unspecified lower leg, initial encounter for closed fracture: Secondary | ICD-10-CM | POA: Diagnosis present

## 2015-05-08 DIAGNOSIS — F988 Other specified behavioral and emotional disorders with onset usually occurring in childhood and adolescence: Secondary | ICD-10-CM | POA: Diagnosis present

## 2015-05-08 DIAGNOSIS — T148XXA Other injury of unspecified body region, initial encounter: Secondary | ICD-10-CM

## 2015-05-08 HISTORY — PX: ORIF ANKLE FRACTURE: SHX5408

## 2015-05-08 LAB — BASIC METABOLIC PANEL
Anion gap: 7 (ref 5–15)
BUN: 9 mg/dL (ref 6–20)
CHLORIDE: 106 mmol/L (ref 101–111)
CO2: 30 mmol/L (ref 22–32)
CREATININE: 0.76 mg/dL (ref 0.44–1.00)
Calcium: 9.6 mg/dL (ref 8.9–10.3)
Glucose, Bld: 100 mg/dL — ABNORMAL HIGH (ref 65–99)
Potassium: 4.6 mmol/L (ref 3.5–5.1)
SODIUM: 143 mmol/L (ref 135–145)

## 2015-05-08 LAB — PROTIME-INR
INR: 1.04
Prothrombin Time: 13.8 seconds (ref 11.4–15.0)

## 2015-05-08 LAB — APTT: APTT: 29 s (ref 24–36)

## 2015-05-08 LAB — CBC
HEMATOCRIT: 43.6 % (ref 35.0–47.0)
Hemoglobin: 14.8 g/dL (ref 12.0–16.0)
MCH: 32.5 pg (ref 26.0–34.0)
MCHC: 33.9 g/dL (ref 32.0–36.0)
MCV: 96.1 fL (ref 80.0–100.0)
PLATELETS: 88 10*3/uL — AB (ref 150–440)
RBC: 4.54 MIL/uL (ref 3.80–5.20)
RDW: 13.2 % (ref 11.5–14.5)
WBC: 3.4 10*3/uL — AB (ref 3.6–11.0)

## 2015-05-08 LAB — CK: CK TOTAL: 61 U/L (ref 38–234)

## 2015-05-08 SURGERY — OPEN REDUCTION INTERNAL FIXATION (ORIF) ANKLE FRACTURE
Anesthesia: General | Laterality: Left

## 2015-05-08 MED ORDER — ONDANSETRON HCL 4 MG/2ML IJ SOLN: 4.0000 mg | Freq: Four times a day (QID) | INTRAMUSCULAR | Status: DC | PRN

## 2015-05-08 MED ORDER — METOCLOPRAMIDE HCL 5 MG/ML IJ SOLN
5.0000 mg | Freq: Three times a day (TID) | INTRAMUSCULAR | Status: DC | PRN
Start: 2015-05-08 — End: 2015-05-11

## 2015-05-08 MED ORDER — ACETAMINOPHEN 325 MG PO TABS: 650.0000 mg | ORAL_TABLET | Freq: Four times a day (QID) | ORAL | Status: DC | PRN

## 2015-05-08 MED ORDER — ONDANSETRON HCL 4 MG/2ML IJ SOLN
INTRAMUSCULAR | Status: DC | PRN
  Administered 2015-05-08: 4 mg via INTRAVENOUS

## 2015-05-08 MED ORDER — KCL IN DEXTROSE-NACL 20-5-0.9 MEQ/L-%-% IV SOLN
INTRAVENOUS | Status: DC
  Administered 2015-05-08 – 2015-05-10 (×3): via INTRAVENOUS
  Filled 2015-05-08 (×7): qty 1000

## 2015-05-08 MED ORDER — FLEET ENEMA 7-19 GM/118ML RE ENEM: 1.0000 | ENEMA | Freq: Once | RECTAL | Status: DC | PRN

## 2015-05-08 MED ORDER — PHENYLEPHRINE HCL 10 MG/ML IJ SOLN
INTRAMUSCULAR | Status: DC | PRN
  Administered 2015-05-08 (×2): 100 ug via INTRAVENOUS

## 2015-05-08 MED ORDER — CEFAZOLIN SODIUM-DEXTROSE 2-3 GM-% IV SOLR
2.0000 g | Freq: Once | INTRAVENOUS | Status: DC
  Filled 2015-05-08: qty 50

## 2015-05-08 MED ORDER — ENOXAPARIN SODIUM 40 MG/0.4ML ~~LOC~~ SOLN
40.0000 mg | SUBCUTANEOUS | Status: DC
  Administered 2015-05-09 – 2015-05-11 (×3): 40 mg via SUBCUTANEOUS
  Filled 2015-05-08 (×3): qty 0.4

## 2015-05-08 MED ORDER — VITAMIN E 45 MG (100 UNIT) PO CAPS
200.0000 [IU] | ORAL_CAPSULE | Freq: Every day | ORAL | Status: DC
  Filled 2015-05-08: qty 2

## 2015-05-08 MED ORDER — DOCUSATE SODIUM 100 MG PO CAPS
100.0000 mg | ORAL_CAPSULE | Freq: Two times a day (BID) | ORAL | Status: DC
  Administered 2015-05-08 – 2015-05-10 (×4): 100 mg via ORAL
  Filled 2015-05-08 (×5): qty 1

## 2015-05-08 MED ORDER — MORPHINE SULFATE (PF) 2 MG/ML IV SOLN
2.0000 mg | Freq: Once | INTRAVENOUS | Status: AC
  Administered 2015-05-08: 2 mg via INTRAVENOUS
  Filled 2015-05-08: qty 1

## 2015-05-08 MED ORDER — KETOCONAZOLE 2 % EX CREA
1.0000 "application " | TOPICAL_CREAM | Freq: Two times a day (BID) | CUTANEOUS | Status: DC
  Administered 2015-05-09 – 2015-05-11 (×4): 1 via TOPICAL
  Filled 2015-05-08 (×4): qty 15

## 2015-05-08 MED ORDER — HYDROMORPHONE HCL 1 MG/ML IJ SOLN: 0.5000 mg | INTRAMUSCULAR | Status: DC | PRN

## 2015-05-08 MED ORDER — DEXAMETHASONE SODIUM PHOSPHATE 4 MG/ML IJ SOLN
INTRAMUSCULAR | Status: DC | PRN
  Administered 2015-05-08: 10 mg via INTRAVENOUS

## 2015-05-08 MED ORDER — CALCIUM CARBONATE-VITAMIN D 500-200 MG-UNIT PO TABS
1.0000 | ORAL_TABLET | Freq: Two times a day (BID) | ORAL | Status: DC
  Administered 2015-05-08 – 2015-05-11 (×5): 1 via ORAL
  Filled 2015-05-08 (×9): qty 1

## 2015-05-08 MED ORDER — DOCUSATE SODIUM 50 MG/5ML PO LIQD: 50.0000 mg | Freq: Two times a day (BID) | ORAL | Status: DC

## 2015-05-08 MED ORDER — LACTATED RINGERS IV SOLN
INTRAVENOUS | Status: DC | PRN
  Administered 2015-05-08: 16:00:00 via INTRAVENOUS

## 2015-05-08 MED ORDER — DIPHENHYDRAMINE HCL 12.5 MG/5ML PO ELIX
12.5000 mg | ORAL_SOLUTION | ORAL | Status: DC | PRN
Start: 2015-05-08 — End: 2015-05-11

## 2015-05-08 MED ORDER — ONDANSETRON HCL 4 MG/2ML IJ SOLN
4.0000 mg | Freq: Once | INTRAMUSCULAR | Status: AC
  Administered 2015-05-08: 4 mg via INTRAVENOUS
  Filled 2015-05-08: qty 2

## 2015-05-08 MED ORDER — POLYETHYLENE GLYCOL 3350 17 G PO PACK
17.0000 g | PACK | Freq: Every day | ORAL | Status: DC
  Administered 2015-05-09 – 2015-05-10 (×2): 17 g via ORAL
  Filled 2015-05-08 (×2): qty 1

## 2015-05-08 MED ORDER — RISPERIDONE 1 MG PO TABS
2.0000 mg | ORAL_TABLET | Freq: Two times a day (BID) | ORAL | Status: DC
  Administered 2015-05-08 – 2015-05-11 (×6): 2 mg via ORAL
  Filled 2015-05-08 (×6): qty 2

## 2015-05-08 MED ORDER — PROPOFOL 10 MG/ML IV BOLUS
INTRAVENOUS | Status: DC | PRN
  Administered 2015-05-08: 60 mg via INTRAVENOUS

## 2015-05-08 MED ORDER — ACETAMINOPHEN 650 MG RE SUPP: 650.0000 mg | Freq: Four times a day (QID) | RECTAL | Status: DC | PRN

## 2015-05-08 MED ORDER — MIDAZOLAM HCL 2 MG/2ML IJ SOLN
INTRAMUSCULAR | Status: DC | PRN
  Administered 2015-05-08: 2 mg via INTRAVENOUS

## 2015-05-08 MED ORDER — OXYCODONE HCL 5 MG PO TABS
5.0000 mg | ORAL_TABLET | ORAL | Status: DC | PRN
  Administered 2015-05-09 – 2015-05-10 (×2): 5 mg via ORAL
  Filled 2015-05-08 (×3): qty 1

## 2015-05-08 MED ORDER — ACETAMINOPHEN 325 MG PO TABS
650.0000 mg | ORAL_TABLET | Freq: Four times a day (QID) | ORAL | Status: DC | PRN
Start: 2015-05-08 — End: 2015-05-11

## 2015-05-08 MED ORDER — FLUVOXAMINE MALEATE 50 MG PO TABS
25.0000 mg | ORAL_TABLET | Freq: Every day | ORAL | Status: DC
  Administered 2015-05-08 – 2015-05-10 (×3): 25 mg via ORAL
  Filled 2015-05-08 (×4): qty 1

## 2015-05-08 MED ORDER — BISACODYL 10 MG RE SUPP
10.0000 mg | Freq: Every day | RECTAL | Status: DC | PRN
  Administered 2015-05-11: 10 mg via RECTAL
  Filled 2015-05-08: qty 1

## 2015-05-08 MED ORDER — LIDOCAINE HCL (CARDIAC) 20 MG/ML IV SOLN
INTRAVENOUS | Status: DC | PRN
  Administered 2015-05-08: 100 mg via INTRAVENOUS

## 2015-05-08 MED ORDER — SODIUM CHLORIDE 0.9 % IV BOLUS (SEPSIS)
500.0000 mL | Freq: Once | INTRAVENOUS | Status: AC
  Administered 2015-05-08: 500 mL via INTRAVENOUS

## 2015-05-08 MED ORDER — NEOMYCIN-POLYMYXIN-PRAMOXINE 1 % EX CREA
TOPICAL_CREAM | Freq: Two times a day (BID) | CUTANEOUS | Status: DC
  Filled 2015-05-08: qty 28

## 2015-05-08 MED ORDER — ADULT MULTIVITAMIN W/MINERALS CH
1.0000 | ORAL_TABLET | Freq: Every day | ORAL | Status: DC
  Administered 2015-05-09 – 2015-05-11 (×2): 1 via ORAL
  Filled 2015-05-08 (×3): qty 1

## 2015-05-08 MED ORDER — KETAMINE HCL 50 MG/ML IJ SOLN
INTRAMUSCULAR | Status: DC | PRN
  Administered 2015-05-08: 75 mg via INTRAMUSCULAR

## 2015-05-08 MED ORDER — KETOROLAC TROMETHAMINE 30 MG/ML IJ SOLN
INTRAMUSCULAR | Status: DC | PRN
  Administered 2015-05-08: 30 mg via INTRAVENOUS

## 2015-05-08 MED ORDER — FENTANYL CITRATE (PF) 100 MCG/2ML IJ SOLN: 25.0000 ug | INTRAMUSCULAR | Status: DC | PRN

## 2015-05-08 MED ORDER — CEFAZOLIN SODIUM-DEXTROSE 2-3 GM-% IV SOLR
2.0000 g | Freq: Four times a day (QID) | INTRAVENOUS | Status: AC
  Administered 2015-05-08 – 2015-05-09 (×3): 2 g via INTRAVENOUS
  Filled 2015-05-08 (×3): qty 50

## 2015-05-08 MED ORDER — ACETAMINOPHEN 160 MG/5ML PO ELIX: 60.0000 mg | ORAL_SOLUTION | ORAL | Status: DC | PRN

## 2015-05-08 MED ORDER — METOCLOPRAMIDE HCL 5 MG PO TABS: 5.0000 mg | ORAL_TABLET | Freq: Three times a day (TID) | ORAL | Status: DC | PRN

## 2015-05-08 MED ORDER — VITAMIN E 180 MG (400 UNIT) PO CAPS
400.0000 [IU] | ORAL_CAPSULE | Freq: Every day | ORAL | Status: DC
  Administered 2015-05-09 – 2015-05-11 (×2): 400 [IU] via ORAL
  Filled 2015-05-08 (×2): qty 1

## 2015-05-08 MED ORDER — ONDANSETRON HCL 4 MG/2ML IJ SOLN: 4.0000 mg | Freq: Once | INTRAMUSCULAR | Status: DC | PRN

## 2015-05-08 MED ORDER — ONDANSETRON HCL 4 MG PO TABS
4.0000 mg | ORAL_TABLET | Freq: Four times a day (QID) | ORAL | Status: DC | PRN
Start: 2015-05-08 — End: 2015-05-11

## 2015-05-08 MED ORDER — FENTANYL CITRATE (PF) 100 MCG/2ML IJ SOLN
INTRAMUSCULAR | Status: DC | PRN
  Administered 2015-05-08: 25 ug via INTRAVENOUS
  Administered 2015-05-08: 50 ug via INTRAVENOUS
  Administered 2015-05-08: 25 ug via INTRAVENOUS
  Administered 2015-05-08: 100 ug via INTRAVENOUS

## 2015-05-08 MED ORDER — MAGNESIUM HYDROXIDE 400 MG/5ML PO SUSP
30.0000 mL | Freq: Every day | ORAL | Status: DC | PRN
Start: 2015-05-08 — End: 2015-05-11
  Administered 2015-05-10: 30 mL via ORAL
  Filled 2015-05-08: qty 30

## 2015-05-08 MED ORDER — LORATADINE 5 MG/5ML PO SYRP
5.0000 mg | ORAL_SOLUTION | Freq: Every day | ORAL | Status: DC
  Administered 2015-05-09 – 2015-05-11 (×3): 5 mg via ORAL
  Filled 2015-05-08 (×3): qty 5

## 2015-05-08 MED ORDER — PANTOPRAZOLE SODIUM 40 MG PO TBEC
40.0000 mg | DELAYED_RELEASE_TABLET | Freq: Two times a day (BID) | ORAL | Status: DC
  Administered 2015-05-08 – 2015-05-11 (×6): 40 mg via ORAL
  Filled 2015-05-08 (×6): qty 1

## 2015-05-08 MED ORDER — BACITRACIN-NEOMYCIN-POLYMYXIN 400-5-5000 EX OINT
TOPICAL_OINTMENT | Freq: Two times a day (BID) | CUTANEOUS | Status: DC
  Administered 2015-05-08 – 2015-05-11 (×5): 1 via TOPICAL
  Filled 2015-05-08 (×8): qty 1

## 2015-05-08 MED ORDER — QUETIAPINE FUMARATE 300 MG PO TABS
800.0000 mg | ORAL_TABLET | Freq: Every day | ORAL | Status: DC
  Administered 2015-05-08 – 2015-05-10 (×3): 800 mg via ORAL
  Filled 2015-05-08 (×3): qty 2

## 2015-05-08 MED ORDER — VALPROATE SODIUM 250 MG/5ML PO SYRP
500.0000 mg | ORAL_SOLUTION | Freq: Two times a day (BID) | ORAL | Status: DC
  Administered 2015-05-08 – 2015-05-11 (×6): 500 mg via ORAL
  Filled 2015-05-08 (×7): qty 10

## 2015-05-08 MED ORDER — LISDEXAMFETAMINE DIMESYLATE 30 MG PO CAPS
60.0000 mg | ORAL_CAPSULE | ORAL | Status: DC
  Administered 2015-05-09 – 2015-05-11 (×3): 60 mg via ORAL
  Filled 2015-05-08 (×4): qty 2

## 2015-05-08 MED ORDER — KCL IN DEXTROSE-NACL 20-5-0.9 MEQ/L-%-% IV SOLN
INTRAVENOUS | Status: DC
  Filled 2015-05-08 (×2): qty 1000

## 2015-05-08 SURGICAL SUPPLY — 61 items
BANDAGE ELASTIC 4 CLIP ST LF (GAUZE/BANDAGES/DRESSINGS) ×6 IMPLANT
BANDAGE ELASTIC 6 CLIP ST LF (GAUZE/BANDAGES/DRESSINGS) ×3 IMPLANT
BIT DRILL 2.5X2.75 QC CALB (BIT) ×6 IMPLANT
BIT DRILL 2.9 CANN QC NONSTRL (BIT) ×3 IMPLANT
BIT DRILL 3.5X5.5 QC CALB (BIT) ×3 IMPLANT
BIT DRILL CALIBRATED 2.7 (BIT) ×2 IMPLANT
BIT DRILL CALIBRATED 2.7MM (BIT) ×1
BLADE SURG SZ10 CARB STEEL (BLADE) ×6 IMPLANT
BNDG COHESIVE 4X5 TAN STRL (GAUZE/BANDAGES/DRESSINGS) ×3 IMPLANT
BNDG ESMARK 6X12 TAN STRL LF (GAUZE/BANDAGES/DRESSINGS) ×3 IMPLANT
BNDG PLASTER FAST 4X5 WHT LF (CAST SUPPLIES) ×12 IMPLANT
BNDG PLSTR 5X4 FST ST WHT LF (CAST SUPPLIES) ×4
CANISTER SUCT 1200ML W/VALVE (MISCELLANEOUS) ×3 IMPLANT
CHLORAPREP W/TINT 26ML (MISCELLANEOUS) ×6 IMPLANT
DRAPE C-ARM XRAY 36X54 (DRAPES) ×3 IMPLANT
DRAPE C-ARMOR (DRAPES) ×3 IMPLANT
DRAPE INCISE IOBAN 66X45 STRL (DRAPES) ×3 IMPLANT
DRAPE U-SHAPE 47X51 STRL (DRAPES) ×3 IMPLANT
ELECT CAUTERY BLADE 6.4 (BLADE) ×3 IMPLANT
GAUZE PETRO XEROFOAM 1X8 (MISCELLANEOUS) ×3 IMPLANT
GAUZE SPONGE 4X4 12PLY STRL (GAUZE/BANDAGES/DRESSINGS) ×3 IMPLANT
GLOVE BIO SURGEON STRL SZ8 (GLOVE) ×6 IMPLANT
GLOVE INDICATOR 8.0 STRL GRN (GLOVE) ×3 IMPLANT
GOWN STRL REUS W/ TWL LRG LVL3 (GOWN DISPOSABLE) ×1 IMPLANT
GOWN STRL REUS W/ TWL XL LVL3 (GOWN DISPOSABLE) ×1 IMPLANT
GOWN STRL REUS W/TWL LRG LVL3 (GOWN DISPOSABLE) ×3
GOWN STRL REUS W/TWL XL LVL3 (GOWN DISPOSABLE) ×3
HEMOVAC 400ML (MISCELLANEOUS) ×3
K-WIRE ACE 1.6X6 (WIRE) ×9
KIT DRAIN HEMOVAC JP 7FR 400ML (MISCELLANEOUS) ×1 IMPLANT
KWIRE ACE 1.6X6 (WIRE) ×3 IMPLANT
LABEL OR SOLS (LABEL) ×3 IMPLANT
NS IRRIG 1000ML POUR BTL (IV SOLUTION) ×3 IMPLANT
PACK EXTREMITY ARMC (MISCELLANEOUS) ×3 IMPLANT
PAD ABD DERMACEA PRESS 5X9 (GAUZE/BANDAGES/DRESSINGS) ×6 IMPLANT
PAD CAST CTTN 4X4 STRL (SOFTGOODS) ×2 IMPLANT
PAD GROUND ADULT SPLIT (MISCELLANEOUS) ×3 IMPLANT
PAD PREP 24X41 OB/GYN DISP (PERSONAL CARE ITEMS) ×3 IMPLANT
PADDING CAST COTTON 4X4 STRL (SOFTGOODS) ×6
PLATE LOCK 6H 139 LT DIST FIB (Plate) ×3 IMPLANT
SCREW ACE CAN 4.0 40M (Screw) ×6 IMPLANT
SCREW CORTICAL 3.5MM 18MM (Screw) ×3 IMPLANT
SCREW CORTICAL LOW PROF 3.5X20 (Screw) ×3 IMPLANT
SCREW LOCK CORT STAR 3.5X18 (Screw) ×3 IMPLANT
SCREW LOCK CORT STAR 3.5X20 (Screw) ×3 IMPLANT
SCREW LOCK CORT STAR 3.5X22 (Screw) ×3 IMPLANT
SCREW LOW PROFILE 12MMX3.5MM (Screw) ×6 IMPLANT
SCREW LP 3.5 (Screw) ×3 IMPLANT
SCREW LP 3.5X44 (Screw) ×3 IMPLANT
SCREW NON LOCKING LP 3.5 14MM (Screw) ×6 IMPLANT
SCREW NON LOCKING LP 3.5 16MM (Screw) ×3 IMPLANT
SPONGE LAP 18X18 5 PK (GAUZE/BANDAGES/DRESSINGS) ×3 IMPLANT
STAPLER SKIN PROX 35W (STAPLE) ×3 IMPLANT
STOCKINETTE M/LG 89821 (MISCELLANEOUS) ×3 IMPLANT
STOCKINETTE STRL 6IN 960660 (GAUZE/BANDAGES/DRESSINGS) ×3 IMPLANT
STRAP SAFETY BODY (MISCELLANEOUS) ×3 IMPLANT
SUT PROLENE 4 0 PS 2 18 (SUTURE) ×3 IMPLANT
SUT VIC AB 0 CT1 36 (SUTURE) ×3 IMPLANT
SUT VIC AB 2-0 SH 27 (SUTURE) ×6
SUT VIC AB 2-0 SH 27XBRD (SUTURE) ×2 IMPLANT
SYRINGE 10CC LL (SYRINGE) ×3 IMPLANT

## 2015-05-08 NOTE — Anesthesia Postprocedure Evaluation (Signed)
  Anesthesia Post-op Note  Patient: Roberta Hampton  Procedure(s) Performed: Procedure(s): OPEN REDUCTION INTERNAL FIXATION (ORIF) ANKLE FRACTURE (Left)  Anesthesia type:General  Patient location: PACU  Post pain: Pain level controlled  Post assessment: Post-op Vital signs reviewed, Patient's Cardiovascular Status Stable, Respiratory Function Stable, Patent Airway and No signs of Nausea or vomiting  Post vital signs: Reviewed and stable  Last Vitals:  Filed Vitals:   05/08/15 1915  BP: 122/93  Pulse:   Temp: 36.2 C  Resp: 15    Level of consciousness: awake, alert  and patient cooperative  Complications: No apparent anesthesia complications

## 2015-05-08 NOTE — ED Notes (Signed)
Family and caregiver at bedside. 

## 2015-05-08 NOTE — Anesthesia Procedure Notes (Signed)
Procedure Name: LMA Insertion Date/Time: 05/08/2015 4:30 PM Performed by: Nelda Marseille Pre-anesthesia Checklist: Patient identified, Patient being monitored, Timeout performed, Emergency Drugs available and Suction available Patient Re-evaluated:Patient Re-evaluated prior to inductionOxygen Delivery Method: Circle system utilized Preoxygenation: Pre-oxygenation with 100% oxygen Intubation Type: IV induction Ventilation: Mask ventilation without difficulty LMA: LMA inserted LMA Size: 3.5 Tube type: Oral Number of attempts: 1 Placement Confirmation: positive ETCO2 and breath sounds checked- equal and bilateral Tube secured with: Tape Dental Injury: Teeth and Oropharynx as per pre-operative assessment

## 2015-05-08 NOTE — Anesthesia Preprocedure Evaluation (Signed)
Anesthesia Evaluation  Patient identified by MRN, date of birth, ID band Patient awake    Reviewed: Allergy & Precautions, NPO status , Patient's Chart, lab work & pertinent test results, reviewed documented beta blocker date and time   Airway Mallampati: III  TM Distance: >3 FB     Dental  (+) Chipped   Pulmonary          Cardiovascular     Neuro/Psych Seizures -,  PSYCHIATRIC DISORDERS    GI/Hepatic   Endo/Other    Renal/GU      Musculoskeletal   Abdominal   Peds  Hematology   Anesthesia Other Findings Decreased mentation. Obesity.  Reproductive/Obstetrics                             Anesthesia Physical Anesthesia Plan  ASA: III  Anesthesia Plan: General   Post-op Pain Management:    Induction: Intravenous  Airway Management Planned: Oral ETT and LMA  Additional Equipment:   Intra-op Plan:   Post-operative Plan:   Informed Consent: I have reviewed the patients History and Physical, chart, labs and discussed the procedure including the risks, benefits and alternatives for the proposed anesthesia with the patient or authorized representative who has indicated his/her understanding and acceptance.     Plan Discussed with: CRNA  Anesthesia Plan Comments:         Anesthesia Quick Evaluation

## 2015-05-08 NOTE — ED Provider Notes (Signed)
[name] Emergency Department Provider Note  ____________________________________________  Time seen: Approximately 7:48 AM  I have reviewed the triage vital signs and the nursing notes.   HISTORY  Chief Complaint Fall  Caveat-history of present illness and review of systems limited secondary to the patient's mental retardation, all information obtained from EMS on arrival as well as the patient's caregiver who is at bedside.  HPI Roberta Hampton is a 49 y.o. female with history of mental retardation, bipolar disorder, seizure disorder, intermittent explosive disorder, ADHD who presents for evaluation of left ankle pain. The patient's caregiver reports that she was checked on last night at 2 AM after going to the bathroom. This morning at approximately 7 AM, her caregiver went into check on her and found her lying on the floor asleep. She was awakened and then complained of left ankle pain. She is presumed to have fallen out of bed during the weehours of the morning. She has been at her baseline terms of mental status. She has not been ill recently. No fevers, chills, vomiting, diarrhea, cough or other URI symptoms.   Past Medical History  Diagnosis Date  . Seizure disorder   . Hyponatremia     associated with polydipsia  . Obesity   . Mental retardation   . Attention deficit disorder     Patient Active Problem List   Diagnosis Date Noted  . 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 Encounter for therapeutic drug monitoring 07/22/2013  . Mental retardation 07/22/2013    Past Surgical History  Procedure Laterality Date  . Vaginal hysterectomy      Current Outpatient Rx  Name  Route  Sig  Dispense  Refill  . Calcium Carb-Cholecalciferol (CALCIUM 600 + D PO)   Oral   Take 600 mg by mouth 2 (two) times daily.         Marland Kitchen docusate (COLACE) 50 MG/5ML liquid   Oral   Take 50 mg by mouth 2 (two) times daily. One  teaspoon         . fluvoxaMINE (LUVOX) 25 MG tablet   Oral   Take 25 mg by mouth at bedtime.         Marland Kitchen ketoconazole (NIZORAL) 2 % cream   Topical   Apply 1 application topically 2 (two) times daily.         Marland Kitchen lisdexamfetamine (VYVANSE) 60 MG capsule   Oral   Take 60 mg by mouth every morning.         . loratadine (CLARITIN) 5 MG/5ML syrup   Oral   Take by mouth daily. 2 teaspoons         . meloxicam (MOBIC) 15 MG tablet   Oral   Take 15 mg by mouth daily.         . Multiple Vitamin (MULTIVITAMIN) tablet   Oral   Take 1 tablet by mouth daily.         . QUEtiapine (SEROQUEL) 400 MG tablet   Oral   Take 800 mg by mouth at bedtime.          . risperiDONE (RISPERDAL) 2 MG tablet   Oral   Take 2 mg by mouth 2 (two) times daily.         Marland Kitchen valproate (DEPAKENE) 250 MG/5ML syrup   Oral   Take 10 mLs (500 mg total) by mouth 2 (two) times daily. 1 teaspoon   600 mL   5   . vitamin  E 200 UNIT capsule   Oral   Take 200 Units by mouth daily.         Marland Kitchen acetaminophen (TYLENOL) 160 MG/5ML elixir   Oral   Take 15 mg/kg by mouth every 4 (four) hours as needed for fever (4 teaspoons every 6 hours as needed).         . polyethylene glycol (MIRALAX / GLYCOLAX) packet   Oral   Take 17 g by mouth daily.           Allergies Review of patient's allergies indicates no known allergies.  Family History  Problem Relation Age of Onset  . Asthma Brother   . Seizures      paternal cousin    Social History Social History  Substance Use Topics  . Smoking status: Never Smoker   . Smokeless tobacco: Never Used  . Alcohol Use: No    Review of Systems  Constitutional: No fever/chills Respiratory: Denies shortness of breath. Gastrointestinal: No vomiting.  No diarrhea.  Caveat-history of present illness and review of systems limited secondary to the patient's mental retardation, all information obtained from EMS on arrival as well as the patient's  caregiver who is at bedside. ____________________________________________   PHYSICAL EXAM:  VITAL SIGNS: ED Triage Vitals  Enc Vitals Group     BP 05/08/15 0746 117/69 mmHg     Pulse Rate 05/08/15 0746 92     Resp --      Temp 05/08/15 0746 97.6 F (36.4 C)     Temp Source 05/08/15 0746 Oral     SpO2 05/08/15 0746 97 %     Weight 05/08/15 0746 154 lb 5.2 oz (70.001 kg)     Height 05/08/15 0746 4\' 10"  (1.473 m)     Head Cir --      Peak Flow --      Pain Score --      Pain Loc --      Pain Edu? --      Excl. in [place]? --     Constitutional: Alert, developmentally delayed, answers simple questions appropriately. Appears to be in pain with movement of the left ankle. Eyes: Conjunctivae are normal. PERRL. EOMI. Head: Atraumatic. Nose: No congestion/rhinnorhea. Mouth/Throat: Mucous membranes are moist.  Oropharynx non-erythematous. Neck: No stridor. No midline c-spine tenderness. Cardiovascular: Normal rate, regular rhythm. Grossly normal heart sounds.  Good peripheral circulation. Respiratory: Normal respiratory effort.  No retractions. Lungs CTAB. Gastrointestinal: Soft and nontender. No distention. No abdominal bruits. No CVA tenderness. Genitourinary: deferred Musculoskeletal: swelling and tenderness with ecchymosis of the left ankle, dopplered left DP pulse and wiggles the toes. Mild tenderness in the midline of the lumbar spine. Neurologic:  Moves all extremity spontaneously and equally but does not cooperate with formal neurological testing Skin:  Skin is warm, dry and intact. No rash noted.   ____________________________________________   LABS (all labs ordered are listed, but only abnormal results are displayed)  Labs Reviewed  BASIC METABOLIC PANEL - Abnormal; Notable for the following:    Glucose, Bld 100 (*)    All other components within normal limits  CBC - Abnormal; Notable for the following:    WBC 3.4 (*)    Platelets 88 (*)    All other components within  normal limits  CK  PROTIME-INR  APTT   ____________________________________________  EKG  ED ECG REPORT I, [name], the attending physician, personally viewed and interpreted this ECG.   Date: 05/08/2015  EKG Time: 07:55  Rate: 89  Rhythm: normal sinus rhythm  Axis: Normal axis  Intervals:none  ST&T Change: No acute ST segment elevation. Nonspecific T-wave abnormality.  ____________________________________________  RADIOLOGY  Lumbar xray FINDINGS: Frontal, lateral, and spot lumbosacral lateral images obtained. There are 5 non-rib-bearing lumbar type vertebral bodies. There is no fracture or spondylolisthesis. Disc spaces appear intact. No erosive change.  IMPRESSION: No fracture or spondylolisthesis. No appreciable arthropathy.   Pelvis xray IMPRESSION: No acute fracture or dislocation identified about the pelvis. If there is lateralizing hip pain, recommend dedicated hip series.  Left knee xray  FINDINGS: Frontal and lateral views obtained. There is no demonstrable fracture or dislocation. No appreciable joint effusion. Joint spaces appear intact. No erosive change.  IMPRESSION: No demonstrable fracture or joint effusion. No appreciable arthropathic change.  Left ankle xray  FINDINGS: The talus is laterally and anteriorly subluxed. There is a medial malleolar fracture with 1 cm lateral displacement. The patient has a mildly comminuted, segmental fracture of the distal fibula. The fracture fragment measures approximately 6 cm craniocaudal. There is foreshortening of approximately 1.5 cm and approximately 1 shaft with medial displacement of the distal fragment. Soft tissue swelling is present about the ankle.  IMPRESSION: Distal fibular and medial malleolar fractures as described.  Posterior and lateral subluxation of the tibiotalar joint. ____________________________________________   PROCEDURES  Procedure(s) performed: None  Critical  Care performed: No  ____________________________________________   INITIAL IMPRESSION / ASSESSMENT AND PLAN / ED COURSE  Pertinent labs & imaging results that were available during my care of the patient were reviewed by me and considered in my medical decision making (see chart for details).  Roberta Hampton is a 49 y.o. female with history of mental retardation, bipolar disorder, seizure disorder, intermittent explosive disorder, ADHD who presents for evaluation of left ankle pain after  Presumed mechanical fall from bed. On exam, she is intellectual disabled but at her baseline in terms of mental status according to her caregivers and her mother at bedside. Vital signs stable, she is afebrile. X-rays of the left ankle confirm a bimalleolar fracture. She remains neurovascularly intact. Discussed the case with Dr. Roland Rack of ortho at 10:15 AM and he will admit. Will apply splint. ____________________________________________   FINAL CLINICAL IMPRESSION(S) / ED DIAGNOSES  Final diagnoses:  Bimalleolar ankle fracture, left, closed, initial encounter      [name], MD 05/08/15 1018

## 2015-05-08 NOTE — ED Notes (Signed)
Pt fell from bed this morning and is suffering knee and ankle pain. Left ankle swollen with discoloration and some deformity.

## 2015-05-08 NOTE — H&P (Signed)
Subjective:  Chief complaint:  Left ankle pain.  The patient is a 49 y.o. female resident of a group home who sustained an injury to the left ankle sometime early this morning. There were no witnesses to the injury. Apparently, she went to the bathroom around 220 in the morning and got back into bed. There were no apparent problems during this trip to the bathroom, according to the group home staff accompanying the patient in the emergency room. She complained of significant left ankle pain this morning and was brought to the emergency room where x-rays demonstrated a displaced bimalleolar fracture dislocation of her left ankle. The patient denies any associated injury or loss of consciousness associated with the injury, and denies any light-headedness, loss of consciousness, chest pain, or shortness of breath which might have contributed to the injury. However, she is a poor historian with underlying mental retardation and bipolar disorder. According to the patient's mother, there is no history of prior injury to this left ankle.  Patient Active Problem List   Diagnosis Date Noted  . 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 Encounter for therapeutic drug monitoring 07/22/2013  . Mental retardation 07/22/2013   Past Medical History  Diagnosis Date  . Seizure disorder   . Hyponatremia     associated with polydipsia  . Obesity   . Mental retardation   . Attention deficit disorder     Past Surgical History  Procedure Laterality Date  . Vaginal hysterectomy       (Not in a hospital admission) No Known Allergies  Social History  Substance Use Topics  . Smoking status: Never Smoker   . Smokeless tobacco: Never Used  . Alcohol Use: No    Family History  Problem Relation Age of Onset  . Asthma Brother   . Seizures      paternal cousin     Review of Systems: As noted above. The patient denies any chest pain, shortness of breath, nausea, vomiting,  diarrhea, constipation, belly pain, blood in his/her stool, or burning with urination.  Objective: Temp:  [97.6 F (36.4 C)] 97.6 F (36.4 C) (09/02 0746) Pulse Rate:  [84-92] 84 (09/02 1117) Resp:  [16] 16 (09/02 1117) BP: (106-117)/(69-79) 106/79 mmHg (09/02 1117) SpO2:  [97 %] 97 % (09/02 1117) Weight:  [70.001 kg (154 lb 5.2 oz)] 70.001 kg (154 lb 5.2 oz) (09/02 0746)  Physical Exam: General:  Alert, no acute distress Psychiatric:  Patient is not competent for consent, but her mother is at the bedside  Cardiovascular:  RRR  Respiratory:  Clear to auscultation. No wheezing. Non-labored breathing GI:  Abdomen is soft and non-tender Skin:  No lesions in the area of chief complaint Neurologic:  Sensation intact distally Lymphatic:  No axillary or cervical lymphadenopathy  Orthopedic Exam:  Orthopedic examination is limited to the left ankle and lower leg. Skin inspection demonstrates moderate swelling around the ankle without skin violations such as lacerations, abrasions, or rashes. There is significant pain with any attempted active or passive motion of the ankle. She is able to dorsiflex and plantarflex her toes minimally, although with pain. She has good capillary refill to all digits of her left foot.  Imaging Review: Recent x-rays of the left ankle are available for review. These films demonstrate a closed displaced bimalleolar fracture dislocation of the left ankle with a comminuted fracture of the distal fibula and a short transverse fracture of the medial malleolus. The talus is subluxed laterally.  Assessment: Closed  displaced bimalleolar fracture dislocation left ankle.  Plan: The treatment options, including medical management and open reduction and internal fixation of the left ankle fracture, have been discussed in detail with the patient and her mother. The risks (including bleeding, infection, nerve and/or blood vessel injury, persistent or recurrent pain, stiffness,  malunion and/or nonunion, loosening or failure of the components, development of arthritis, need for further surgery, blood clots, strokes, heart attacks or arrhythmias, pneumonia, etc.) and benefits of the procedure were discussed. The patient's mother, who is the patient's healthcare proxy, states her understanding and agrees to proceed. A consent will be obtained by the nursing staff.

## 2015-05-08 NOTE — Op Note (Signed)
05/08/2015  7:06 PM  Patient:   Roberta Hampton  Pre-Op Diagnosis:   Bimalleolar fracture dislocation left ankle.  Post-Op Diagnosis:   Same  Procedure:   Open reduction and internal fixation of bimalleolar left ankle fracture dislocation.  Surgeon:   Pascal Lux, MD  Assistant:   None  Anesthesia:   Spinal  Findings:   As above.  Complications:   None  EBL:   20 cc  Fluids:   600 cc crystalloid  UOP:   None  TT:   124 min at 300 mmHg  Drains:   None  Closure:   Staples  Implants:   Biomet ALPS 8-hole distal fibular locking plate and screws  Brief Clinical Note:   The patient is a 49 year old mentally handicapped group home resident who apparently fell out of bed and injured her left ankle early this morning. She was brought to the emergency room where x-rays demonstrated the above-noted injury. She presents at this time for definitive management of her injury.  Procedure:   The patient was brought into the operating room. After adequate spinal anesthesia was obtained, she was lain in the supine position. The appropriate surgical site was verified with a timeout before the left foot and lower extremity were prepped with DuraPrep solution, then draped sterilely. Preoperative antibiotics were administered. The limb was exsanguinated with an Esmarch and the thigh tourniquet inflated to 300 mmHg. Laterally, a 9-10 cm incision was made over the lateral aspect of the distal fibula. The incision was carried down through the subcutaneous tissues to expose the fracture site. The fracture hematoma was debrided before the fracture fragments were reduced. Distally, the fracture was temporarily secured using a bone clamp. A lag screw was placed in an anterior to posterior direction perpendicular to the fracture. Proximally, a K wire was used to stabilize the more proximal fracture fragment. The smaller butterfly fragments posteriorly were left alone as they reduced nicely with  ligamentotaxis. An 8-hole precontoured distal fibular locking plate was applied over the lateral aspect of the distal fibula. After verifying its position fluoroscopically, it was secured using a 3.5 nonlocking cortical screw proximal to the fracture. Again the plates position was adjusted slightly based on AP and lateral projections before it was secured using additional bicortical screws proximally and multiple locking screws distally. The adequacy of fracture reduction and hardware position was verified fluoroscopically in AP and lateral projections and found to be excellent. Two syndesmotic screws were placed through several of the more distal holes of the plate and fixed through three cortices each. Again the adequacy of syndesmotic screw position was verified fluoroscopically in AP and lateral projections and found to be excellent.  Attention was directed to the medial side. An approximately 3-4 cm curvilinear incision was made over the anterior and distal portions of the medial malleolus. This incision also was carried down through the subcutaneous tissues to expose the fracture site. Care was taken to identify and protect the saphenous nerve and vein. The fracture hematoma again was removed before the fracture was reduced and temporarily secured using a bone clamp. Two guidewires were placed obliquely across the fracture from distal to proximal into the distal tibial metaphysis. After verifying their positions fluoroscopically, each guidewire was overdrilled with a cannulated drill before two 40 mm partially threaded cannulated cancellus screws were advanced and tightened securely to compress the fracture. Again the adequacy of fracture reduction, hardware position, and mortise restoration was verified in AP, lateral, and oblique projections and found  to be excellent.  Each wound was copiously irrigated with sterile saline solution. Laterally, the subcutaneous tissues were closed in two layers using 2-0  Vicryl interrupted sutures before the skin was closed using staples. Medially, the subcutaneous tissues were closed using 2-0 Vicryl interrupted sutures before the skin was closed using staples. A total of 30 cc of half percent plain Sensorcaine was injected in and around all of the wounds to help with postoperative analgesia. Sterile bulky dressings were applied to the wounds before the patient was placed into a posterior splint with a sugar tong supplement, maintaining the ankle in as near to neutral dorsiflexion as possible. Preoperatively, she appeared to have a slight flexion contracture of her ankle when examined under anesthesia with the ankle appropriately reduced. The patient was then awakened and returned to the recovery room in satisfactory condition after tolerating the procedure well.

## 2015-05-08 NOTE — Transfer of Care (Signed)
Immediate Anesthesia Transfer of Care Note  Patient: Roberta Hampton  Procedure(s) Performed: Procedure(s): OPEN REDUCTION INTERNAL FIXATION (ORIF) ANKLE FRACTURE (Left)  Patient Location: PACU  Anesthesia Type:General  Level of Consciousness: sedated  Airway & Oxygen Therapy: Patient Spontanous Breathing and Patient connected to face mask oxygen  Post-op Assessment: Report given to RN and Post -op Vital signs reviewed and stable  Post vital signs: Reviewed and stable  Last Vitals:  Filed Vitals:   05/08/15 1915  BP: 122/93  Pulse:   Temp: 36.2 C  Resp: 15    Complications: No apparent anesthesia complications

## 2015-05-08 NOTE — ED Notes (Signed)
MD at bedside. 

## 2015-05-08 NOTE — ED Notes (Signed)
Patient transported to X-ray 

## 2015-05-09 LAB — CBC WITH DIFFERENTIAL/PLATELET
BASOS ABS: 0 10*3/uL (ref 0–0.1)
Eosinophils Absolute: 0 10*3/uL (ref 0–0.7)
Eosinophils Relative: 0 %
HEMATOCRIT: 36.9 % (ref 35.0–47.0)
HEMOGLOBIN: 12.8 g/dL (ref 12.0–16.0)
Lymphs Abs: 0.6 10*3/uL — ABNORMAL LOW (ref 1.0–3.6)
MCH: 33.4 pg (ref 26.0–34.0)
MCHC: 34.6 g/dL (ref 32.0–36.0)
MCV: 96.7 fL (ref 80.0–100.0)
Monocytes Absolute: 0.8 10*3/uL (ref 0.2–0.9)
NEUTROS ABS: 1.9 10*3/uL (ref 1.4–6.5)
Platelets: 90 10*3/uL — ABNORMAL LOW (ref 150–440)
RBC: 3.82 MIL/uL (ref 3.80–5.20)
RDW: 12.7 % (ref 11.5–14.5)
WBC: 3.4 10*3/uL — ABNORMAL LOW (ref 3.6–11.0)

## 2015-05-09 LAB — BASIC METABOLIC PANEL
ANION GAP: 4 — AB (ref 5–15)
BUN: 11 mg/dL (ref 6–20)
CALCIUM: 8.4 mg/dL — AB (ref 8.9–10.3)
CHLORIDE: 109 mmol/L (ref 101–111)
CO2: 28 mmol/L (ref 22–32)
Creatinine, Ser: 0.75 mg/dL (ref 0.44–1.00)
GFR calc non Af Amer: >60 mL/min (ref >60)
Glucose, Bld: 156 mg/dL — ABNORMAL HIGH (ref 65–99)
POTASSIUM: 4.1 mmol/L (ref 3.5–5.1)
Sodium: 141 mmol/L (ref 135–145)

## 2015-05-09 MED ORDER — SODIUM CHLORIDE 0.9 % IV BOLUS (SEPSIS)
500.0000 mL | Freq: Once | INTRAVENOUS | Status: AC
  Administered 2015-05-09: 500 mL via INTRAVENOUS

## 2015-05-09 NOTE — Clinical Social Work Note (Signed)
Clinical Social Work Assessment  Patient Details  Name: Roberta Hampton MRN: [id number] Date of Birth: 06/28/1966  Date of referral:  05/09/15               Reason for consult:  Facility Placement                Permission sought to share information with:  Chartered certified accountant granted to share information::  Yes, Verbal Permission Granted  Name::      [place]::   [place]   Relationship::     Contact Information:     Housing/Transportation Living arrangements for the past 2 months:  [place] of Information:  Parent, Other (Comment Required) ([place] ) Patient Interpreter Needed:  None Criminal Activity/Legal Involvement Pertinent to Current Situation/Hospitalization:  No - Comment as needed Significant Relationships:  Parents Lives with:  Facility Resident Do you feel safe going back to the place where you live?  Yes Need for family participation in patient care:  Yes (Comment)  Care giving concerns: Patient is a resident at [facility] group home in [place], [place].    Social Worker assessment / plan: Holiday representative (CSW) received SNF consult. Patient has a MR diagnosis and is from [facility] in [place], [place]. Patient had a ankle fracture, which Dr. Roland Rack repaired. PT is recommending SNF. CSW met with patient and her mother Roberta Hampton (home- [phone]/ [phone]) and [place] [phone] were at bedside. Patient was asleep in bed and did not participate in assessment. Mother reported that she is patient's guardian. CSW explained that PT is recommending SNF and that patient's Medicare will pay for days 1-20 at 100% if she meets a 3 night qualifying stay. Mother prefers [facility]. CSW also explained that with patient's MR diagonisis patient's PASARR will take a couple of days to come back. SNF list was provided to mother.     FL2 complete and no chart. CSW asked MD to sign 30 day note on chart. MD agreed to sign FL2 and 30 day note on chart Sunday Morning 05/10/15.    Employment status:  Disabled (Comment on whether or not currently receiving Disability) Insurance information:  Medicare PT Recommendations:  [place] / Referral to community resources:  [place]  Patient/Family's Response to care: Patient's mother is agreeable for patient to go to rehab.   Patient/Family's Understanding of and Emotional Response to Diagnosis, Current Treatment, and Prognosis: Mother was pleasant and thanked CSW for visit.   Emotional Assessment Appearance:  Appears stated age Attitude/Demeanor/Rapport:  Unable to Assess Affect (typically observed):  Unable to Assess Orientation:  Fluctuating Orientation (Suspected and/or reported Sundowners) Alcohol / Substance use:  Not Applicable Psych involvement (Current and /or in the community):  No (Comment)  Discharge Needs  Concerns to be addressed:  Discharge Planning Concerns Readmission within the last 30 days:  No Current discharge risk:  Dependent with Mobility Barriers to Discharge:  Continued Medical Work up   Loralyn Freshwater, LCSW 05/09/2015, 3:04 PM

## 2015-05-09 NOTE — Progress Notes (Signed)
  Subjective: 1 Day Post-Op Procedure(s) (LRB): OPEN REDUCTION INTERNAL FIXATION (ORIF) ANKLE FRACTURE (Left) Patient reports pain as moderate.  Her mother states that she slept well, as the patient is not communicating and is presently sleeping. Patient seen in rounds with Dr. Roland Rack. Patient is well, and has had no acute complaints or problems Plan is to go Back to her group home after hospital stay. Negative for chest pain and shortness of breath Fever: no Gastrointestinal: Negative for nausea and vomiting  Objective: Vital signs in last 24 hours: Temp:  [97.1 F (36.2 C)-98.3 F (36.8 C)] 98 F (36.7 C) (09/03 0516) Pulse Rate:  [69-109] 84 (09/03 0516) Resp:  [15-20] 18 (09/03 0516) BP: (99-124)/(62-93) 106/62 mmHg (09/03 0516) SpO2:  [96 %-97 %] 97 % (09/03 0516) FiO2 (%):  [28 %] 28 % (09/02 2017) Weight:  [70.001 kg (154 lb 5.2 oz)] 70.001 kg (154 lb 5.2 oz) (09/02 0746)  Intake/Output from previous day:  Intake/Output Summary (Last 24 hours) at 05/09/15 0702 Last data filed at 05/09/15 0041  Gross per 24 hour  Intake    650 ml  Output     21 ml  Net    629 ml    Intake/Output this shift:    Labs:  Recent Labs  05/08/15 0814 05/09/15 0333  HGB 14.8 12.8    Recent Labs  05/08/15 0814 05/09/15 0333  WBC 3.4* 3.4*  RBC 4.54 3.82  HCT 43.6 36.9  PLT 88* 90*    Recent Labs  05/08/15 0814 05/09/15 0333  NA 143 141  K 4.6 4.1  CL 106 109  CO2 30 28  BUN 9 11  CREATININE 0.76 0.75  GLUCOSE 100* 156*  CALCIUM 9.6 8.4*    Recent Labs  05/08/15 1031 05/08/15 1118  INR SPECIMEN CLOTTED 1.04     EXAM General - Patient is Resting presently. Extremity - Neurovascular intact Dressing/Incision - clean, dry, with the splint and dressing intact Motor Function - no foot motion tested.  Past Medical History  Diagnosis Date  . Seizure disorder   . Hyponatremia     associated with polydipsia  . Obesity   . Mental retardation   . Attention  deficit disorder     Assessment/Plan: 1 Day Post-Op Procedure(s) (LRB): OPEN REDUCTION INTERNAL FIXATION (ORIF) ANKLE FRACTURE (Left) Active Problems:   Ankle fracture  Estimated body mass index is 32.26 kg/(m^2) as calculated from the following:   Height as of this encounter: 4\' 10"  (1.473 m).   Weight as of this encounter: 70.001 kg (154 lb 5.2 oz). Advance diet Up with therapy  DVT Prophylaxis - Lovenox Weight-Bearing as tolerated to left leg  Reche Dixon, PA-C Orthopaedic Surgery 05/09/2015, 7:02 AM

## 2015-05-09 NOTE — Progress Notes (Signed)
Physical Therapy Treatment Patient Details Name: Roberta Hampton MRN: [id number] DOB: [birth year] Today's Date: 05/09/2015    History of Present Illness Pt is here from group home with ankle fx, ORIF.      PT Comments    It is likely that pt will have a hard time with progression of functional mobility.  She struggles to follow basic instructions for in bed exercises and is unable to answer most questions appropriately.  She is able to show occasional effort with LE exercises, but mostly is P/AAROM and needs a lot of constant cuing.   Follow Up Recommendations  SNF     Equipment Recommendations  Rolling walker with 5" wheels    Recommendations for Other Services       Precautions / Restrictions Precautions Precautions: Fall Restrictions LLE Weight Bearing: Non weight bearing    Mobility  Bed Mobility               General bed mobility comments: again deferred, pt indicates that she wants to try standing tomorrow  Transfers                    Ambulation/Gait                 Stairs            Wheelchair Mobility    Modified Rankin (Stroke Patients Only)       Balance                                    Cognition Arousal/Alertness:  (more awake than this AM, but still sleepy)   Overall Cognitive Status: History of cognitive impairments - at baseline                      Exercises General Exercises - Lower Extremity Ankle Circles/Pumps:  (toe wiggles, limited b/l) Quad Sets: AROM;10 reps Heel Slides: AROM;10 reps (no resisted leg extension on L ) Hip ABduction/ADduction: AAROM;AROM;10 reps Straight Leg Raises: AAROM;PROM;5 reps    General Comments        Pertinent Vitals/Pain Pain Assessment:  (unable to rate, reports random pain in b/l LEs)    Home Living                      Prior Function            PT Goals (current goals can now be found in the care plan section)       Frequency  BID    PT Plan Current plan remains appropriate    Co-evaluation             End of Session   Activity Tolerance: Patient limited by lethargy Patient left: with family/visitor present;with bed alarm set     Time: 9233-0076 PT Time Calculation (min) (ACUTE ONLY): 13 min  Charges:  $Therapeutic Exercise: 8-22 mins                    G Codes:     Wayne Both, PT, DPT [phone]  Kreg Shropshire 05/09/2015, 5:59 PM

## 2015-05-09 NOTE — Progress Notes (Signed)
BP low this am.  Poggi contacted and bolus given per order.  Rechecked BP and still low.  Family/caregiver in room states patient's BP runs low all the time and that is her normal pressure at home.  Pt NOT symptomatic.

## 2015-05-09 NOTE — Evaluation (Signed)
Physical Therapy Evaluation Patient Details Name: Roberta Hampton MRN: [id number] DOB: [birth year] Today's Date: 05/09/2015   History of Present Illness  Pt is here from group home with ankle fx, ORIF.    Clinical Impression  Pt is very lethargic t/o session and does fall asleep multiple times.  Pt was unable to do too much secondary to mental status (pt with MR and has been at a group home for ~15 years).  She is [place] and has never used an AD so this could be difficult.     Follow Up Recommendations SNF    Equipment Recommendations  Rolling walker with 5" wheels    Recommendations for Other Services       Precautions / Restrictions Precautions Precautions: Fall Restrictions Weight Bearing Restrictions: Yes LLE Weight Bearing: Non weight bearing      Mobility  Bed Mobility               General bed mobility comments: deferred secondary to pt being very sleepy and unable to follow basic cuing while awake, will try this afternoon if she's more appropaitre  Transfers                    Ambulation/Gait                Stairs            Wheelchair Mobility    Modified Rankin (Stroke Patients Only)       Balance                                             Pertinent Vitals/Pain Pain Assessment:  (unable to rate, c/o more of knee than ankle pain)    Home Living Family/patient expects to be discharged to:: Skilled nursing facility                 Additional Comments: Pt more comforatble at group home she has been at for ~15 year but they likely could not care for her well enough given her current functional status    Prior Function Level of Independence: Independent (needs general assist seconary to MR)               Hand Dominance        Extremity/Trunk Assessment   Upper Extremity Assessment: Difficult to assess due to impaired cognition (appears to have some below shoulder height AROM)            Lower Extremity Assessment: Difficult to assess due to impaired cognition (Pt with minimal understanding of LE movements requested)         Communication   Communication:  (limited verbal communication, mother present and helpful)  Cognition Arousal/Alertness: Lethargic (pt falling alseep consistently t/o session) Behavior During Therapy: Flat affect Overall Cognitive Status: History of cognitive impairments - at baseline                      General Comments      Exercises        Assessment/Plan    PT Assessment Patient needs continued PT services  PT Diagnosis Difficulty walking;Generalized weakness   PT Problem List Decreased strength;Decreased range of motion;Decreased activity tolerance;Decreased balance;Decreased coordination;Decreased mobility  PT Treatment Interventions Gait training;DME instruction;Functional mobility training;Therapeutic activities;Therapeutic exercise;Balance training;Neuromuscular re-education   PT Goals (Current goals can be found in  the Care Plan section) Acute Rehab PT Goals Patient Stated Goal: mom wants her to get back to independent walking PT Goal Formulation: With family Time For Goal Achievement: 05/23/15 Potential to Achieve Goals: Fair    Frequency BID (per ability to participate)   Barriers to discharge        Co-evaluation               End of Session   Activity Tolerance: Patient limited by lethargy Patient left: with family/visitor present;with bed alarm set           Time: 0822-0842 PT Time Calculation (min) (ACUTE ONLY): 20 min   Charges:   PT Evaluation $Initial PT Evaluation Tier I: 1 Procedure     PT G Codes:       Wayne Both, PT, DPT [phone]  Kreg Shropshire 05/09/2015, 11:14 AM

## 2015-05-09 NOTE — Clinical Social Work Placement (Signed)
   CLINICAL SOCIAL WORK PLACEMENT  NOTE  Date:  05/09/2015  Patient Details  Name: Roberta Hampton MRN: [id number] Date of Birth: November 12, 1965  Clinical Social Work is seeking post-discharge placement for this patient at the [place] level of care (*CSW will initial, date and re-position this form in  chart as items are completed):  Yes   Patient/family provided with [facility] Work Department's list of facilities offering this level of care within the geographic area requested by the patient (or if unable, by the patient's family).  Yes   Patient/family informed of their freedom to choose among providers that offer the needed level of care, that participate in Medicare, Medicaid or managed care program needed by the patient, have an available bed and are willing to accept the patient.  Yes   Patient/family informed of [facility]'s ownership interest in [facility] and [facility], as well as of the fact that they are under no obligation to receive care at these facilities.  PASRR submitted to EDS on 05/09/15     PASRR number received on       Existing PASRR number confirmed on       FL2 transmitted to all facilities in geographic area requested by pt/family on 05/09/15     FL2 transmitted to all facilities within larger geographic area on       Patient informed that his/her managed care company has contracts with or will negotiate with certain facilities, including the following:            Patient/family informed of bed offers received.  Patient chooses bed at       Physician recommends and patient chooses bed at      Patient to be transferred to   on  .  Patient to be transferred to facility by       Patient family notified on   of transfer.  Name of family member notified:        PHYSICIAN Please sign FL2     Additional Comment:    _______________________________________________ Loralyn Freshwater, LCSW 05/09/2015, 3:03  PM

## 2015-05-10 LAB — BASIC METABOLIC PANEL
ANION GAP: 5 (ref 5–15)
BUN: 5 mg/dL — ABNORMAL LOW (ref 6–20)
CO2: 29 mmol/L (ref 22–32)
Calcium: 8.5 mg/dL — ABNORMAL LOW (ref 8.9–10.3)
Chloride: 109 mmol/L (ref 101–111)
Creatinine, Ser: 0.58 mg/dL (ref 0.44–1.00)
GFR calc Af Amer: >60 mL/min (ref >60)
GLUCOSE: 97 mg/dL (ref 65–99)
POTASSIUM: 4 mmol/L (ref 3.5–5.1)
Sodium: 143 mmol/L (ref 135–145)

## 2015-05-10 MED ORDER — ACETAMINOPHEN 325 MG PO TABS: 650.0000 mg | ORAL_TABLET | Freq: Four times a day (QID) | ORAL | Status: DC | PRN

## 2015-05-10 MED ORDER — ONDANSETRON HCL 4 MG PO TABS: 4.0000 mg | ORAL_TABLET | Freq: Four times a day (QID) | ORAL | Status: DC | PRN

## 2015-05-10 MED ORDER — VITAMIN E 180 MG (400 UNIT) PO CAPS: 400.0000 [IU] | ORAL_CAPSULE | Freq: Every day | ORAL | Status: DC

## 2015-05-10 MED ORDER — BISACODYL 10 MG RE SUPP: 10.0000 mg | Freq: Every day | RECTAL | Status: DC | PRN

## 2015-05-10 MED ORDER — ENOXAPARIN SODIUM 40 MG/0.4ML ~~LOC~~ SOLN: 40.0000 mg | SUBCUTANEOUS | Status: DC

## 2015-05-10 MED ORDER — DOCUSATE SODIUM 100 MG PO CAPS: 100.0000 mg | ORAL_CAPSULE | Freq: Two times a day (BID) | ORAL | Status: DC

## 2015-05-10 MED ORDER — OXYCODONE HCL 5 MG PO TABS: 5.0000 mg | ORAL_TABLET | ORAL | Status: DC | PRN

## 2015-05-10 NOTE — Progress Notes (Signed)
  Subjective: 2 Days Post-Op Procedure(s) (LRB): OPEN REDUCTION INTERNAL FIXATION (ORIF) ANKLE FRACTURE (Left) Patient reports pain as moderate.   Patient seen in rounds with Dr. Roland Rack. Patient is well, and has had no acute complaints or problems Plan is to go Rehab after hospital stay. According to the social worker, the plan is to have her go to rehabilitation on Tuesday. Negative for chest pain and shortness of breath Fever: no Gastrointestinal: Negative for nausea and vomiting. He has not had a bowel movement for several days.  Objective: Vital signs in last 24 hours: Temp:  [97.9 F (36.6 C)-100.2 F (37.9 C)] 100.1 F (37.8 C) (09/04 0547) Pulse Rate:  [70-109] 109 (09/04 0547) Resp:  [16-20] 18 (09/04 0547) BP: (88-119)/(53-70) 115/70 mmHg (09/04 0547) SpO2:  [92 %-99 %] 92 % (09/04 0547)  Intake/Output from previous day:  Intake/Output Summary (Last 24 hours) at 05/10/15 0630 Last data filed at 05/10/15 0615  Gross per 24 hour  Intake 3147.5 ml  Output      7 ml  Net 3140.5 ml    Intake/Output this shift: Total I/O In: 2187.5 [I.V.:2187.5] Out: 5 [Urine:5]  Labs:  Recent Labs  05/08/15 0814 05/09/15 0333  HGB 14.8 12.8    Recent Labs  05/08/15 0814 05/09/15 0333  WBC 3.4* 3.4*  RBC 4.54 3.82  HCT 43.6 36.9  PLT 88* 90*    Recent Labs  05/09/15 0333 05/10/15 0345  NA 141 143  K 4.1 4.0  CL 109 109  CO2 28 29  BUN 11 5*  CREATININE 0.75 0.58  GLUCOSE 156* 97  CALCIUM 8.4* 8.5*    Recent Labs  05/08/15 1031 05/08/15 1118  INR SPECIMEN CLOTTED 1.04     EXAM General - Patient is Alert and Very minimal conversation was noted. Extremity - Dorsiflexion/Plantar flexion intact Dressing/Incision - clean, dry, no drainage Motor Function - intact, moving foot and toes well on exam. The patient did physical therapy in the bed with no ambulation.  Past Medical History  Diagnosis Date  . Seizure disorder   . Hyponatremia     associated  with polydipsia  . Obesity   . Mental retardation   . Attention deficit disorder     Assessment/Plan: 2 Days Post-Op Procedure(s) (LRB): OPEN REDUCTION INTERNAL FIXATION (ORIF) ANKLE FRACTURE (Left) Active Problems:   Ankle fracture  Estimated body mass index is 32.26 kg/(m^2) as calculated from the following:   Height as of this encounter: 4\' 10"  (1.473 m).   Weight as of this encounter: 70.001 kg (154 lb 5.2 oz). Up with therapy  The plan is for the patient to discharge to rehabilitation on Tuesday.  DVT Prophylaxis - Lovenox Nonweightbearing on the left leg.  Reche Dixon, PA-C Orthopaedic Surgery 05/10/2015, 6:30 AM

## 2015-05-10 NOTE — Progress Notes (Signed)
Physical Therapy Treatment Patient Details Name: Roberta Hampton MRN: [id number] DOB: [birth year] Today's Date: 05/10/2015    History of Present Illness Pt is here from group home with ankle fx, ORIF.      PT Comments    Pt is able to attempt some mobility acts today but is very limited with what she is able to do and needs heavy +2 assist for just 3 standing attempts but minimal functional ability and inability to do any hopping/walking.  She is more awake this session but given her baseline mental status this will be a difficult rehab process.  Follow Up Recommendations  SNF     Equipment Recommendations  Rolling walker with 5" wheels    Recommendations for Other Services       Precautions / Restrictions Precautions Precautions: Fall Restrictions Weight Bearing Restrictions: Yes LLE Weight Bearing: Non weight bearing    Mobility  Bed Mobility Overal bed mobility: Needs Assistance Bed Mobility: Supine to Sit;Sit to Supine     Supine to sit: Max assist (pt intially has heavy leaning back, improved with cuing) Sit to supine: Max assist   General bed mobility comments: Pt hesitant and confused with getting to EOB  Transfers Overall transfer level: Needs assistance Equipment used: Rolling walker (2 wheeled) Transfers: Sit to/from Stand Sit to Stand: +2 physical assistance;Max assist         General transfer comment: 3 attempts at standing, pt very hesitant and needs much encouragement, set up and assist to attempt.  She has trouble getting hips forward and though she shows some effort she is not very effective using walker appropriately or following instructions.   Ambulation/Gait             General Gait Details: Tried to get pt to do 1 small hop with +2 assist on her best standing attempt but she simply was unable.   Stairs            Wheelchair Mobility    Modified Rankin (Stroke Patients Only)       Balance                                    Cognition Arousal/Alertness: Lethargic (less so initially and able to do some participation)   Overall Cognitive Status: History of cognitive impairments - at baseline                      Exercises General Exercises - Lower Extremity Ankle Circles/Pumps: 10 reps;AROM;Hampton (just tow "wiggles" on L) Quad Sets: 10 reps;Strengthening Short Arc Quad: Right;10 reps Heel Slides: AROM;10 reps (no resistance with L leg press) Hip ABduction/ADduction: AAROM;AROM;10 reps    General Comments        Pertinent Vitals/Pain Pain Assessment:  (unable to rate, calls out with nearly all activity)    Home Living                      Prior Function            PT Goals (current goals can now be found in the care plan section) Progress towards PT goals: Progressing toward goals    Frequency  BID    PT Plan Current plan remains appropriate    Co-evaluation             End of Session   Activity Tolerance: Patient limited by lethargy Patient  left: with family/visitor present;with bed alarm set     Time: 0865-7846 PT Time Calculation (min) (ACUTE ONLY): 30 min  Charges:  $Therapeutic Exercise: 8-22 mins $Therapeutic Activity: 8-22 mins                    G Codes:     Roberta Hampton, PT, DPT [phone]  Roberta Hampton 05/10/2015, 11:38 AM

## 2015-05-10 NOTE — Discharge Instructions (Signed)
INSTRUCTIONS AFTER Surgery  o Remove items at home which could result in a fall. This includes throw rugs or furniture in walking pathways o ICE to the affected joint every three hours while awake for 30 minutes at a time, for at least the first 3-5 days, and then as needed for pain and swelling.  Continue to use ice for pain and swelling. You may notice swelling that will progress down to the foot and ankle.  This is normal after surgery.  Elevate your leg when you are not up walking on it.   o Continue to use the breathing machine you got in the hospital (incentive spirometer) which will help keep your temperature down.  It is common for your temperature to cycle up and down following surgery, especially at night when you are not up moving around and exerting yourself.  The breathing machine keeps your lungs expanded and your temperature down.   DIET:  As you were doing prior to hospitalization, we recommend a well-balanced diet.  DRESSING / WOUND CARE / SHOWERING  The patient needs to keep her splint and dressing on at all times until changed and orthopedics. No showering until staples and sutures are removed.  ACTIVITY  o Increase activity slowly as tolerated, but follow the weight bearing instructions below.   o No driving for 6 weeks or until further direction given by your physician.  You cannot drive while taking narcotics.  o No lifting or carrying greater than 10 lbs. until further directed by your surgeon. o Avoid periods of inactivity such as sitting longer than an hour when not asleep. This helps prevent blood clots.  o You may return to work once you are authorized by your doctor.     WEIGHT BEARING  Nonweight bear on the left lower extremity until released by Dr. Roland Rack.   EXERCISES Ambulation with nonweightbearing on the left with physical therapy.  CONSTIPATION  Constipation is defined medically as fewer than three stools per week and severe constipation as less than  one stool per week.  Even if you have a regular bowel pattern at home, your normal regimen is likely to be disrupted due to multiple reasons following surgery.  Combination of anesthesia, postoperative narcotics, change in appetite and fluid intake all can affect your bowels.   YOU MUST use at least one of the following options; they are listed in order of increasing strength to get the job done.  They are all available over the counter, and you may need to use some, POSSIBLY even all of these options:    Drink plenty of fluids (prune juice may be helpful) and high fiber foods Colace 100 mg by mouth twice a day  Senokot for constipation as directed and as needed Dulcolax (bisacodyl), take with full glass of water  Miralax (polyethylene glycol) once or twice a day as needed.  If you have tried all these things and are unable to have a bowel movement in the first 3-4 days after surgery call either your surgeon or your primary doctor.    If you experience loose stools or diarrhea, hold the medications until you stool forms back up.  If your symptoms do not get better within 1 week or if they get worse, check with your doctor.  If you experience "the worst abdominal pain ever" or develop nausea or vomiting, please contact the office immediately for further recommendations for treatment.   ITCHING:  If you experience itching with your medications, try taking only  a single pain pill, or even half a pain pill at a time.  You can also use Benadryl over the counter for itching or also to help with sleep.   TED HOSE STOCKINGS:  Use stockings on both legs until for at least 2 weeks or as directed by physician office. They may be removed at night for sleeping.  MEDICATIONS:  See your medication summary on the After Visit Summary that nursing will review with you.  You may have some home medications which will be placed on hold until you complete the course of blood thinner medication.  It is important for  you to complete the blood thinner medication as prescribed.  PRECAUTIONS:  If you experience chest pain or shortness of breath - call 911 immediately for transfer to the hospital emergency department.   If you develop a fever greater that 101 F, purulent drainage from wound, increased redness or drainage from wound, foul odor from the wound/dressing, or calf pain - CONTACT YOUR SURGEON.                                                   FOLLOW-UP APPOINTMENTS:  If you do not already have a post-op appointment, please call the office for an appointment to be seen by your surgeon.  Guidelines for how soon to be seen are listed in your After Visit Summary, but are typically between 1-4 weeks after surgery.  OTHER INSTRUCTIONS:   The patient may need to use a wheelchair if unable to abide by the weightbearing restrictions.  MAKE SURE YOU:   Understand these instructions.   Get help right away if you are not doing well or get worse.    Thank you for letting us be a part of your medical care team.  It is a privilege we respect greatly.  We hope these instructions will help you stay on track for a fast and full recovery!

## 2015-05-11 DIAGNOSIS — S82843A Displaced bimalleolar fracture of unspecified lower leg, initial encounter for closed fracture: Secondary | ICD-10-CM | POA: Insufficient documentation

## 2015-05-11 LAB — BASIC METABOLIC PANEL
Anion gap: 5 (ref 5–15)
BUN: 10 mg/dL (ref 6–20)
CHLORIDE: 103 mmol/L (ref 101–111)
CO2: 30 mmol/L (ref 22–32)
CREATININE: 0.64 mg/dL (ref 0.44–1.00)
Calcium: 8.5 mg/dL — ABNORMAL LOW (ref 8.9–10.3)
GFR calc Af Amer: >60 mL/min (ref >60)
GLUCOSE: 99 mg/dL (ref 65–99)
Potassium: 3.8 mmol/L (ref 3.5–5.1)
SODIUM: 138 mmol/L (ref 135–145)

## 2015-05-11 NOTE — Care Management Important Message (Signed)
Important Message  Patient Details  Name: Roberta Hampton MRN: [id number] Date of Birth: Jan 04, 1966   Medicare Important Message Given:  Yes-second notification given    Marshell Garfinkel, RN 05/11/2015, 11:19 AM

## 2015-05-11 NOTE — Progress Notes (Signed)
  Subjective: 3 Days Post-Op Procedure(s) (LRB): OPEN REDUCTION INTERNAL FIXATION (ORIF) ANKLE FRACTURE (Left) Patient reports pain as mild.   Patient seen in rounds with Dr. Roland Rack. Patient is well, and has had no acute complaints or problems Plan is to go Skilled nursing facility after hospital stay. Negative for chest pain and shortness of breath Fever: no Gastrointestinal: Negative for nausea and vomiting  Objective: Vital signs in last 24 hours: Temp:  [98.2 F (36.8 C)-98.7 F (37.1 C)] 98.4 F (36.9 C) (09/05 0451) Pulse Rate:  [106-114] 106 (09/05 0451) Resp:  [16-24] 24 (09/05 0451) BP: (99-112)/(60-80) 100/68 mmHg (09/05 0451) SpO2:  [93 %-96 %] 93 % (09/05 0451)  Intake/Output from previous day:  Intake/Output Summary (Last 24 hours) at 05/11/15 0604 Last data filed at 05/11/15 0400  Gross per 24 hour  Intake 1831.25 ml  Output      1 ml  Net 1830.25 ml    Intake/Output this shift: Total I/O In: 1231.3 [I.V.:1231.3] Out: 0   Labs:  Recent Labs  05/08/15 0814 05/09/15 0333  HGB 14.8 12.8    Recent Labs  05/08/15 0814 05/09/15 0333  WBC 3.4* 3.4*  RBC 4.54 3.82  HCT 43.6 36.9  PLT 88* 90*    Recent Labs  05/09/15 0333 05/10/15 0345  NA 141 143  K 4.1 4.0  CL 109 109  CO2 28 29  BUN 11 5*  CREATININE 0.75 0.58  GLUCOSE 156* 97  CALCIUM 8.4* 8.5*    Recent Labs  05/08/15 1031 05/08/15 1118  INR SPECIMEN CLOTTED 1.04     EXAM General - Patient is Confused and Resting, but arousable. Limited conversation. Extremity - Sensation intact distally Compartment soft Dressing/Incision - clean, dry, no drainage Motor Function - intact, moving foot and toes well on exam. Limited ambulation with physical therapy. Difficulty with balance.  Past Medical History  Diagnosis Date  . Seizure disorder   . Hyponatremia     associated with polydipsia  . Obesity   . Mental retardation   . Attention deficit disorder     Assessment/Plan: 3  Days Post-Op Procedure(s) (LRB): OPEN REDUCTION INTERNAL FIXATION (ORIF) ANKLE FRACTURE (Left) Active Problems:   Ankle fracture  Estimated body mass index is 32.26 kg/(m^2) as calculated from the following:   Height as of this encounter: 4\' 10"  (1.473 m).   Weight as of this encounter: 70.001 kg (154 lb 5.2 oz). Up with therapy Plan for discharge tomorrow if the patient receives an offer for skilled nursing.  DVT Prophylaxis - Lovenox Non weight-Bearing as tolerated to left leg  Reche Dixon, PA-C Orthopaedic Surgery 05/11/2015, 6:04 AM

## 2015-05-11 NOTE — Progress Notes (Signed)
Physical Therapy Treatment Patient Details Name: Roberta Hampton MRN: [id number] DOB: [birth year] Today's Date: 05/11/2015    History of Present Illness Pt is here from group home with ankle fx, ORIF.      PT Comments    Pt again struggles with PT secondary lethargy and more generally with inability to understand some of what is requested of her. She shows occasional ability to participate with a few reps of exercises here and there with excessive cuing but falls asleep and/or isn't fully able to follow instructions.  She is unable to show real effort with standing today and needs total assist to get to partial standing.  Pt will likely have a hard time until she is able to bear weight and is not so lethargic.  Follow Up Recommendations  SNF     Equipment Recommendations  Rolling walker with 5" wheels    Recommendations for Other Services       Precautions / Restrictions Precautions Precautions: Fall Restrictions Weight Bearing Restrictions: Yes LLE Weight Bearing: Non weight bearing    Mobility  Bed Mobility Overal bed mobility: Needs Assistance Bed Mobility: Supine to Sit;Sit to Supine     Supine to sit: Max assist Sit to supine: Max assist   General bed mobility comments: Pt hesitant and confused with getting to EOB, pt unable to really assist  Transfers Overall transfer level: Needs assistance Equipment used: Rolling walker (2 wheeled) Transfers: Sit to/from Stand Sit to Stand: Max assist;Total assist         General transfer comment: 2 attempts at stadning, both with marginal success.  She needs very heavy assist and is unable to effectively get upright (she does appear to keep R LE off the ground) She nearly misses bed sitting back down being so short and needs very heavy assist to stay safe  Ambulation/Gait             General Gait Details:  (unable )   Stairs            Wheelchair Mobility    Modified Rankin (Stroke Patients Only)        Balance                                    Cognition Arousal/Alertness: Lethargic (pt less awake today than yesterday's session)   Overall Cognitive Status: History of cognitive impairments - at baseline                      Exercises General Exercises - Lower Extremity Ankle Circles/Pumps: 10 reps;Both;AAROM;AROM (L toe wiggles only) Quad Sets: 10 reps;Strengthening Short Arc Quad: Right;10 reps Heel Slides: AROM;10 reps Hip ABduction/ADduction: AAROM;AROM;10 reps    General Comments        Pertinent Vitals/Pain Pain Assessment:  (unable to rate, randomly reports pain with any activity)    Home Living                      Prior Function            PT Goals (current goals can now be found in the care plan section) Progress towards PT goals: Not progressing toward goals - comment (pt falling alseep again during some supine exercises )    Frequency  BID    PT Plan Current plan remains appropriate    Co-evaluation  End of Session Equipment Utilized During Treatment: Gait belt Activity Tolerance: Patient limited by lethargy;Treatment limited secondary to agitation Patient left: with family/visitor present;with bed alarm set     Time: 8638-1771 PT Time Calculation (min) (ACUTE ONLY): 23 min  Charges:  $Therapeutic Exercise: 8-22 mins $Therapeutic Activity: 8-22 mins                    G Codes:     Wayne Both, PT, DPT [phone]  Kreg Shropshire 05/11/2015, 10:26 AM

## 2015-05-11 NOTE — Progress Notes (Signed)
Patient discharged to [facility]. Report called to [facility] at facility. EMS called. Waiting on transportation.

## 2015-05-11 NOTE — Discharge Summary (Signed)
Physician Discharge Summary  Subjective: 3 Days Post-Op Procedure(s) (LRB): OPEN REDUCTION INTERNAL FIXATION (ORIF) ANKLE FRACTURE (Left) Patient reports pain as mild.   Patient seen in rounds with Dr. Roland Rack. Patient is well, and has had no acute complaints or problems Patient is ready to go to a skilled nursing facility, after she receives a bed offer.  Physician Discharge Summary  Patient ID: Roberta Hampton MRN: [id number] DOB/AGE: [birth year] 49 y.o.  Admit date: 05/08/2015 Discharge date: 05/11/2015  Admission Diagnoses:  Discharge Diagnoses:  Active Problems:   Ankle fracture   Discharged Condition: fair  Hospital Course: The patient sustained a left bimalleolar ankle fracture. The patient had surgery on 05/08/2015. The patient is mentally disabled, and does not communicate well. The patient worked with physical therapy with difficulty. The patient was unable to ambulate significantly around the room. She used a walker with nonweightbearing on the left. The patient was sent to rehabilitation on postop day 3.  Treatments: surgery:  Procedure: Open reduction and internal fixation of bimalleolar left ankle fracture dislocation.   Discharge Exam: Blood pressure 100/68, pulse 106, temperature 98.4 F (36.9 C), temperature source Oral, resp. rate 24, height 4\' 10"  (1.473 m), weight 70.001 kg (154 lb 5.2 oz), SpO2 93 %.   Disposition:      Medication List    STOP taking these medications        meloxicam 15 MG tablet  Commonly known as:  MOBIC      TAKE these medications        acetaminophen 325 MG tablet  Commonly known as:  TYLENOL  Take 2 tablets (650 mg total) by mouth every 6 (six) hours as needed for mild pain (or Fever >/= 101).     acetaminophen 160 MG/5ML elixir  Commonly known as:  TYLENOL  Take 15 mg/kg by mouth every 4 (four) hours as needed for fever (4 teaspoons every 6 hours as needed).     bisacodyl 10 MG suppository  Commonly known as:   DULCOLAX  Place 1 suppository (10 mg total) rectally daily as needed for moderate constipation.     CALCIUM 600 + D PO  Take 600 mg by mouth 2 (two) times daily.     docusate sodium 100 MG capsule  Commonly known as:  COLACE  Take 1 capsule (100 mg total) by mouth 2 (two) times daily.     docusate 50 MG/5ML liquid  Commonly known as:  COLACE  Take 50 mg by mouth 2 (two) times daily. One teaspoon     enoxaparin 40 MG/0.4ML injection  Commonly known as:  LOVENOX  Inject 0.4 mLs (40 mg total) into the skin daily.     fluvoxaMINE 25 MG tablet  Commonly known as:  LUVOX  Take 25 mg by mouth at bedtime.     ketoconazole 2 % cream  Commonly known as:  NIZORAL  Apply 1 application topically 2 (two) times daily.     lisdexamfetamine 60 MG capsule  Commonly known as:  VYVANSE  Take 60 mg by mouth every morning.     loratadine 5 MG/5ML syrup  Commonly known as:  CLARITIN  Take by mouth daily. 2 teaspoons     multivitamin tablet  Take 1 tablet by mouth daily.     ondansetron 4 MG tablet  Commonly known as:  ZOFRAN  Take 1 tablet (4 mg total) by mouth every 6 (six) hours as needed for nausea.     oxyCODONE 5 MG immediate release tablet  Commonly known as:  Oxy IR/ROXICODONE  Take 1-2 tablets (5-10 mg total) by mouth every 4 (four) hours as needed for breakthrough pain.     polyethylene glycol packet  Commonly known as:  MIRALAX / GLYCOLAX  Take 17 g by mouth daily.     QUEtiapine 400 MG tablet  Commonly known as:  SEROQUEL  Take 800 mg by mouth at bedtime.     risperiDONE 2 MG tablet  Commonly known as:  RISPERDAL  Take 2 mg by mouth 2 (two) times daily.     valproate 250 MG/5ML syrup  Commonly known as:  DEPAKENE  Take 10 mLs (500 mg total) by mouth 2 (two) times daily. 1 teaspoon     vitamin E 200 UNIT capsule  Take 200 Units by mouth daily.     vitamin E 400 UNIT capsule  Take 1 capsule (400 Units total) by mouth daily.           Follow-up Information     Follow up with Corky Mull, MD. Schedule an appointment as soon as possible for a visit in 1 week.   Specialty:  Surgery   Why:  For wound re-check   Contact information:   [place] [address] [phone]       Signed: Prescott Parma, Roberta Hampton 05/11/2015, 6:07 AM   Objective: Vital signs in last 24 hours: Temp:  [98.2 F (36.8 C)-98.7 F (37.1 C)] 98.4 F (36.9 C) (09/05 0451) Pulse Rate:  [106-114] 106 (09/05 0451) Resp:  [16-24] 24 (09/05 0451) BP: (99-112)/(60-80) 100/68 mmHg (09/05 0451) SpO2:  [93 %-96 %] 93 % (09/05 0451)  Intake/Output from previous day:  Intake/Output Summary (Last 24 hours) at 05/11/15 0607 Last data filed at 05/11/15 0400  Gross per 24 hour  Intake 1831.25 ml  Output      1 ml  Net 1830.25 ml    Intake/Output this shift: Total I/O In: 1231.3 [I.V.:1231.3] Out: 0   Labs:  Recent Labs  05/08/15 0814 05/09/15 0333  HGB 14.8 12.8    Recent Labs  05/08/15 0814 05/09/15 0333  WBC 3.4* 3.4*  RBC 4.54 3.82  HCT 43.6 36.9  PLT 88* 90*    Recent Labs  05/09/15 0333 05/10/15 0345  NA 141 143  K 4.1 4.0  CL 109 109  CO2 28 29  BUN 11 5*  CREATININE 0.75 0.58  GLUCOSE 156* 97  CALCIUM 8.4* 8.5*    Recent Labs  05/08/15 1031 05/08/15 1118  INR SPECIMEN CLOTTED 1.04    EXAM: General - Patient is Confused and Limited communication. Extremity - Sensation intact distally Incision - clean, dry, no drainage. Her splint is in place with no loosening. Motor Function -  the patient ambulates with a walker with nonweightbearing to the chair.  Assessment/Plan: 3 Days Post-Op Procedure(s) (LRB): OPEN REDUCTION INTERNAL FIXATION (ORIF) ANKLE FRACTURE (Left) Procedure(s) (LRB): OPEN REDUCTION INTERNAL FIXATION (ORIF) ANKLE FRACTURE (Left) Past Medical History  Diagnosis Date  . Seizure disorder   . Hyponatremia     associated with polydipsia  . Obesity   . Mental retardation   . Attention deficit disorder     Active Problems:   Ankle fracture  Estimated body mass index is 32.26 kg/(m^2) as calculated from the following:   Height as of this encounter: 4\' 10"  (1.473 m).   Weight as of this encounter: 70.001 kg (154 lb 5.2 oz). Discharge to SNF when she receives a bed offer. Diet - Regular  diet Follow up - in 1 weeks Activity - [facility] Disposition - Skilled nursing facility Condition Upon Discharge - Stable DVT Prophylaxis - Lovenox  Reche Dixon, PA-C Orthopaedic Surgery 05/11/2015, 6:07 AM

## 2015-05-12 ENCOUNTER — Encounter: Payer: Self-pay | Admitting: Surgery

## 2015-05-12 MED FILL — Valproate Sodium Syrup 250 MG/5ML (Base Equiv): ORAL | Qty: 5 | Status: AC

## 2015-06-11 ENCOUNTER — Other Ambulatory Visit: Payer: Self-pay | Admitting: Preventative Medicine

## 2015-06-11 DIAGNOSIS — Z1231 Encounter for screening mammogram for malignant neoplasm of breast: Secondary | ICD-10-CM

## 2015-06-26 DIAGNOSIS — G40909 Epilepsy, unspecified, not intractable, without status epilepticus: Secondary | ICD-10-CM | POA: Diagnosis present

## 2015-06-26 DIAGNOSIS — Z825 Family history of asthma and other chronic lower respiratory diseases: Secondary | ICD-10-CM

## 2015-06-26 DIAGNOSIS — Z79899 Other long term (current) drug therapy: Secondary | ICD-10-CM

## 2015-06-26 DIAGNOSIS — A419 Sepsis, unspecified organism: Principal | ICD-10-CM | POA: Diagnosis present

## 2015-06-26 DIAGNOSIS — L03115 Cellulitis of right lower limb: Secondary | ICD-10-CM | POA: Diagnosis not present

## 2015-06-26 DIAGNOSIS — Z9889 Other specified postprocedural states: Secondary | ICD-10-CM

## 2015-06-26 DIAGNOSIS — E872 Acidosis: Secondary | ICD-10-CM | POA: Diagnosis present

## 2015-06-26 DIAGNOSIS — F79 Unspecified intellectual disabilities: Secondary | ICD-10-CM | POA: Diagnosis present

## 2015-06-26 DIAGNOSIS — L89520 Pressure ulcer of left ankle, unstageable: Secondary | ICD-10-CM | POA: Diagnosis present

## 2015-06-26 DIAGNOSIS — Z90710 Acquired absence of both cervix and uterus: Secondary | ICD-10-CM

## 2015-06-26 DIAGNOSIS — E669 Obesity, unspecified: Secondary | ICD-10-CM | POA: Diagnosis present

## 2015-06-26 NOTE — ED Notes (Addendum)
Pt has swelling and rash on right foot and leg.  Area is tender to touch.  Pt has +2 edema in the right foot.  Foot/leg is warm to touch.  Foot/leg is reddened circumferentially.  Pt has a boot on left leg.  Pt's caregiver reports her left ankle was broken approximately 1 month ago.  Small abrasion on medial aspect of right shin.

## 2015-06-27 ENCOUNTER — Inpatient Hospital Stay: Payer: Medicare Other

## 2015-06-27 ENCOUNTER — Encounter: Payer: Self-pay | Admitting: Internal Medicine

## 2015-06-27 ENCOUNTER — Inpatient Hospital Stay
Admission: EM | Admit: 2015-06-27 | Discharge: 2015-06-30 | DRG: 872 | Disposition: A | Discharge status: Home / Self Care | Payer: Medicare Other | Attending: Internal Medicine | Admitting: Internal Medicine

## 2015-06-27 ENCOUNTER — Emergency Department: Payer: Medicare Other

## 2015-06-27 DIAGNOSIS — L8990 Pressure ulcer of unspecified site, unspecified stage: Secondary | ICD-10-CM | POA: Insufficient documentation

## 2015-06-27 DIAGNOSIS — Z79899 Other long term (current) drug therapy: Secondary | ICD-10-CM | POA: Diagnosis not present

## 2015-06-27 DIAGNOSIS — L03115 Cellulitis of right lower limb: Secondary | ICD-10-CM

## 2015-06-27 DIAGNOSIS — A419 Sepsis, unspecified organism: Secondary | ICD-10-CM | POA: Diagnosis present

## 2015-06-27 DIAGNOSIS — E872 Acidosis: Secondary | ICD-10-CM | POA: Diagnosis present

## 2015-06-27 DIAGNOSIS — E669 Obesity, unspecified: Secondary | ICD-10-CM | POA: Diagnosis present

## 2015-06-27 DIAGNOSIS — Z90710 Acquired absence of both cervix and uterus: Secondary | ICD-10-CM | POA: Diagnosis not present

## 2015-06-27 DIAGNOSIS — F79 Unspecified intellectual disabilities: Secondary | ICD-10-CM | POA: Diagnosis present

## 2015-06-27 DIAGNOSIS — G40909 Epilepsy, unspecified, not intractable, without status epilepticus: Secondary | ICD-10-CM | POA: Diagnosis present

## 2015-06-27 DIAGNOSIS — Z825 Family history of asthma and other chronic lower respiratory diseases: Secondary | ICD-10-CM | POA: Diagnosis not present

## 2015-06-27 DIAGNOSIS — R7989 Other specified abnormal findings of blood chemistry: Secondary | ICD-10-CM

## 2015-06-27 DIAGNOSIS — L89520 Pressure ulcer of left ankle, unstageable: Secondary | ICD-10-CM | POA: Diagnosis present

## 2015-06-27 DIAGNOSIS — Z9889 Other specified postprocedural states: Secondary | ICD-10-CM | POA: Diagnosis not present

## 2015-06-27 HISTORY — DX: Unspecified convulsions: R56.9

## 2015-06-27 LAB — CBC WITH DIFFERENTIAL/PLATELET
BASOS ABS: 0.1 10*3/uL (ref 0–0.1)
Eosinophils Absolute: 0 10*3/uL (ref 0–0.7)
HCT: 45.3 % (ref 35.0–47.0)
Hemoglobin: 15.5 g/dL (ref 12.0–16.0)
Lymphocytes Relative: 6 %
Lymphs Abs: 0.5 10*3/uL — ABNORMAL LOW (ref 1.0–3.6)
MCH: 32.6 pg (ref 26.0–34.0)
MCHC: 34.3 g/dL (ref 32.0–36.0)
MCV: 95.1 fL (ref 80.0–100.0)
MONO ABS: 1.9 10*3/uL — AB (ref 0.2–0.9)
Monocytes Relative: 21 %
Neutro Abs: 6.7 10*3/uL — ABNORMAL HIGH (ref 1.4–6.5)
Neutrophils Relative %: 72 %
Platelets: 96 10*3/uL — ABNORMAL LOW (ref 150–440)
RBC: 4.76 MIL/uL (ref 3.80–5.20)
RDW: 13.4 % (ref 11.5–14.5)
WBC: 9.3 10*3/uL (ref 3.6–11.0)

## 2015-06-27 LAB — LACTIC ACID, PLASMA
LACTIC ACID, VENOUS: 2.5 mmol/L — AB (ref 0.5–2.0)
LACTIC ACID, VENOUS: 2.8 mmol/L — AB (ref 0.5–2.0)
Lactic Acid, Venous: 1 mmol/L (ref 0.5–2.0)

## 2015-06-27 LAB — TROPONIN I: Troponin I: <0.03 ng/mL (ref <0.031)

## 2015-06-27 LAB — MRSA PCR SCREENING: MRSA BY PCR: NEGATIVE

## 2015-06-27 LAB — COMPREHENSIVE METABOLIC PANEL
ALK PHOS: 67 U/L (ref 38–126)
ALT: 11 U/L — AB (ref 14–54)
AST: 22 U/L (ref 15–41)
Albumin: 3.8 g/dL (ref 3.5–5.0)
Anion gap: 10 (ref 5–15)
BILIRUBIN TOTAL: 0.6 mg/dL (ref 0.3–1.2)
BUN: 19 mg/dL (ref 6–20)
CALCIUM: 9.6 mg/dL (ref 8.9–10.3)
CO2: 27 mmol/L (ref 22–32)
CREATININE: 0.62 mg/dL (ref 0.44–1.00)
Chloride: 99 mmol/L — ABNORMAL LOW (ref 101–111)
Glucose, Bld: 106 mg/dL — ABNORMAL HIGH (ref 65–99)
Potassium: 4 mmol/L (ref 3.5–5.1)
Sodium: 136 mmol/L (ref 135–145)
Total Protein: 6.5 g/dL (ref 6.5–8.1)

## 2015-06-27 LAB — APTT: aPTT: 123 seconds — ABNORMAL HIGH (ref 24–36)

## 2015-06-27 LAB — PROTIME-INR
INR: 1.12
Prothrombin Time: 14.6 seconds (ref 11.4–15.0)

## 2015-06-27 MED ORDER — VITAMIN E 45 MG (100 UNIT) PO CAPS
1000.0000 [IU] | ORAL_CAPSULE | Freq: Every day | ORAL | Status: DC
  Administered 2015-06-27 – 2015-06-30 (×4): 1000 [IU] via ORAL
  Filled 2015-06-27 (×4): qty 2

## 2015-06-27 MED ORDER — RISPERIDONE 0.5 MG PO TABS
2.0000 mg | ORAL_TABLET | Freq: Two times a day (BID) | ORAL | Status: DC
  Administered 2015-06-27 – 2015-06-30 (×7): 2 mg via ORAL
  Filled 2015-06-27 (×7): qty 4

## 2015-06-27 MED ORDER — SODIUM CHLORIDE 0.9 % IV BOLUS (SEPSIS)
500.0000 mL | INTRAVENOUS | Status: AC
  Administered 2015-06-27: 500 mL via INTRAVENOUS

## 2015-06-27 MED ORDER — ACETAMINOPHEN 650 MG RE SUPP: 650.0000 mg | Freq: Four times a day (QID) | RECTAL | Status: DC | PRN

## 2015-06-27 MED ORDER — QUETIAPINE FUMARATE 200 MG PO TABS
800.0000 mg | ORAL_TABLET | Freq: Every day | ORAL | Status: DC
  Administered 2015-06-27 – 2015-06-29 (×3): 800 mg via ORAL
  Filled 2015-06-27 (×4): qty 2

## 2015-06-27 MED ORDER — OXYCODONE HCL 5 MG PO TABS
5.0000 mg | ORAL_TABLET | ORAL | Status: DC | PRN
  Administered 2015-06-30: 13:00:00 5 mg via ORAL
  Filled 2015-06-27: qty 1

## 2015-06-27 MED ORDER — ADULT MULTIVITAMIN W/MINERALS CH
1.0000 | ORAL_TABLET | Freq: Every day | ORAL | Status: DC
  Administered 2015-06-27 – 2015-06-30 (×4): 1 via ORAL
  Filled 2015-06-27 (×4): qty 1

## 2015-06-27 MED ORDER — HEPARIN SODIUM (PORCINE) 5000 UNIT/ML IJ SOLN
5000.0000 [IU] | Freq: Three times a day (TID) | INTRAMUSCULAR | Status: DC
  Administered 2015-06-27 – 2015-06-29 (×7): 5000 [IU] via SUBCUTANEOUS
  Filled 2015-06-27 (×7): qty 1

## 2015-06-27 MED ORDER — FLUVOXAMINE MALEATE 50 MG PO TABS
25.0000 mg | ORAL_TABLET | Freq: Every day | ORAL | Status: DC
  Administered 2015-06-27 – 2015-06-29 (×3): 25 mg via ORAL
  Filled 2015-06-27 (×4): qty 1

## 2015-06-27 MED ORDER — SODIUM CHLORIDE 0.9 % IJ SOLN
3.0000 mL | INTRAMUSCULAR | Status: DC | PRN
  Administered 2015-06-28: 17:00:00 3 mL via INTRAVENOUS
  Filled 2015-06-27: qty 10

## 2015-06-27 MED ORDER — VANCOMYCIN HCL IN DEXTROSE 1-5 GM/200ML-% IV SOLN
1000.0000 mg | Freq: Once | INTRAVENOUS | Status: AC
  Administered 2015-06-27: 1000 mg via INTRAVENOUS
  Filled 2015-06-27 (×2): qty 200

## 2015-06-27 MED ORDER — ONDANSETRON HCL 4 MG/2ML IJ SOLN: 4.0000 mg | Freq: Four times a day (QID) | INTRAMUSCULAR | Status: DC | PRN

## 2015-06-27 MED ORDER — DOCUSATE SODIUM 50 MG/5ML PO LIQD: 50.0000 mg | Freq: Two times a day (BID) | ORAL | Status: DC

## 2015-06-27 MED ORDER — ONDANSETRON HCL 4 MG PO TABS: 4.0000 mg | ORAL_TABLET | Freq: Four times a day (QID) | ORAL | Status: DC | PRN

## 2015-06-27 MED ORDER — MORPHINE SULFATE (PF) 2 MG/ML IV SOLN
2.0000 mg | INTRAVENOUS | Status: DC | PRN
  Administered 2015-06-27 – 2015-06-29 (×2): 2 mg via INTRAVENOUS
  Filled 2015-06-27 (×2): qty 1

## 2015-06-27 MED ORDER — SODIUM CHLORIDE 0.9 % IV BOLUS (SEPSIS)
1000.0000 mL | Freq: Once | INTRAVENOUS | Status: AC
  Administered 2015-06-27: 1000 mL via INTRAVENOUS

## 2015-06-27 MED ORDER — FLUVOXAMINE MALEATE 50 MG PO TABS: 25.0000 mg | ORAL_TABLET | Freq: Every day | ORAL | Status: DC

## 2015-06-27 MED ORDER — POLYETHYLENE GLYCOL 3350 17 G PO PACK
17.0000 g | PACK | Freq: Every day | ORAL | Status: DC
  Administered 2015-06-27 – 2015-06-30 (×4): 17 g via ORAL
  Filled 2015-06-27 (×4): qty 1

## 2015-06-27 MED ORDER — LORATADINE 10 MG PO TABS
10.0000 mg | ORAL_TABLET | Freq: Every day | ORAL | Status: DC
Start: 2015-06-27 — End: 2015-06-30
  Administered 2015-06-27 – 2015-06-30 (×4): 10 mg via ORAL
  Filled 2015-06-27 (×4): qty 1

## 2015-06-27 MED ORDER — VITAMIN E 45 MG (100 UNIT) PO CAPS
1000.0000 [IU] | ORAL_CAPSULE | Freq: Every day | ORAL | Status: DC
Start: 2015-06-27 — End: 2015-06-27

## 2015-06-27 MED ORDER — DOCUSATE SODIUM 100 MG PO CAPS
100.0000 mg | ORAL_CAPSULE | Freq: Two times a day (BID) | ORAL | Status: DC
  Administered 2015-06-27 – 2015-06-30 (×7): 100 mg via ORAL
  Filled 2015-06-27 (×7): qty 1

## 2015-06-27 MED ORDER — SODIUM CHLORIDE 0.9 % IJ SOLN
3.0000 mL | Freq: Two times a day (BID) | INTRAMUSCULAR | Status: DC
  Administered 2015-06-27 – 2015-06-30 (×7): 3 mL via INTRAVENOUS

## 2015-06-27 MED ORDER — DOCUSATE SODIUM 50 MG PO CAPS
50.0000 mg | ORAL_CAPSULE | Freq: Two times a day (BID) | ORAL | Status: DC
  Filled 2015-06-27 (×2): qty 1

## 2015-06-27 MED ORDER — ACETAMINOPHEN 325 MG PO TABS
650.0000 mg | ORAL_TABLET | Freq: Four times a day (QID) | ORAL | Status: DC | PRN
  Administered 2015-06-28: 12:00:00 650 mg via ORAL
  Filled 2015-06-27: qty 2

## 2015-06-27 MED ORDER — ADULT MULTIVITAMIN W/MINERALS CH: 1.0000 | ORAL_TABLET | Freq: Every day | ORAL | Status: DC

## 2015-06-27 MED ORDER — VALPROATE SODIUM 250 MG/5ML PO SYRP
500.0000 mg | ORAL_SOLUTION | Freq: Two times a day (BID) | ORAL | Status: DC
Start: 2015-06-27 — End: 2015-06-30
  Administered 2015-06-27 – 2015-06-30 (×7): 500 mg via ORAL
  Filled 2015-06-27 (×8): qty 10

## 2015-06-27 MED ORDER — VANCOMYCIN HCL IN DEXTROSE 750-5 MG/150ML-% IV SOLN
750.0000 mg | Freq: Two times a day (BID) | INTRAVENOUS | Status: DC
  Administered 2015-06-27 – 2015-06-29 (×4): 750 mg via INTRAVENOUS
  Filled 2015-06-27 (×5): qty 150

## 2015-06-27 MED ORDER — PIPERACILLIN-TAZOBACTAM 3.375 G IVPB 30 MIN
3.3750 g | Freq: Once | INTRAVENOUS | Status: AC
  Administered 2015-06-27: 3.375 g via INTRAVENOUS
  Filled 2015-06-27: qty 50

## 2015-06-27 MED ORDER — SODIUM CHLORIDE 0.9 % IV SOLN
INTRAVENOUS | Status: DC
  Administered 2015-06-27 – 2015-06-28 (×4): via INTRAVENOUS

## 2015-06-27 MED ORDER — SODIUM CHLORIDE 0.9 % IV BOLUS (SEPSIS)
1000.0000 mL | INTRAVENOUS | Status: AC
  Administered 2015-06-27: 1000 mL via INTRAVENOUS

## 2015-06-27 NOTE — Plan of Care (Signed)
Problem: Discharge Progression Outcomes Goal: Other Discharge Outcomes/Goals Outcome: Progressing 1. Plan to return to [facility]- mother very supportive/interacting with pt. [facility] home staff member visits and very supportive. 2. Calls out when affected areas touches; otherwise lying quietly with eyes closed; slept at long intervals. Arouses slowly/limited verbal; infrequent wakeful intervals. Muscles relaxed with comfortable appearance. No prn's indicated at this time. 3.VSS. Labs reviewed. 4. Wound on left medial ankle cleansed with foam dressing applied. Right lower leg brace off currently due to cleansing related to urinary incontinence- Right lower leg has small like superficial abrasion encircled with reddness, increase warmth to touch, 2+ edema; noted increase warmth, tender area on inner right thigh. Vancomycin IV continued and IVF's. Meds given crushed in applesauce with much effort, coaching- pt holds food/meds in mouth/slow to swallow; picks out gelcaps and tries to throw them. Rewarded with Pepsi.  5. Pt difficult to arouse with brief periods of alertness; not interested in eating; drank small amounts pepsi/few bites of applesauce. Mother tried to interest pt in eating without success.

## 2015-06-27 NOTE — ED Notes (Signed)
Pt has redness, edema, and tenderness on right foot/leg.  Pt currently sleeping. Pt here with caregiver.

## 2015-06-27 NOTE — Progress Notes (Signed)
[place] [facility] [place] NAME: Tran Randle    MR#:  [id number]  DATE OF BIRTH:  Oct 27, 1965  SUBJECTIVE:  CHIEF COMPLAINT:   Chief Complaint  Patient presents with  . Extremity Pain    right foot and leg.  redness, warmth and edema.   pt has no complains, sleepy, she has some mental retardation.  REVIEW OF SYSTEMS:  Due to mental retardation and sleepy- not able to give ROS.   ROS  DRUG ALLERGIES:  No Known Allergies  VITALS:  Blood pressure 100/63, pulse 100, temperature 97.6 F (36.4 C), temperature source Oral, resp. rate 20, height 4\' 10"  (1.473 m), weight 73.256 kg (161 lb 8 oz), SpO2 96 %.  PHYSICAL EXAMINATION:  GENERAL:  49 y.o.-year-old patient lying in the bed with no acute distress. Sleepy. EYES: Pupils equal, round, reactive to light. No scleral icterus. Extraocular muscles intact.  HEENT: Head atraumatic, normocephalic. Oropharynx and nasopharynx clear.  NECK:  Supple, no jugular venous distention. No thyroid enlargement, no tenderness.  LUNGS: Normal breath sounds bilaterally, no wheezing, rales,rhonchi or crepitation. No use of accessory muscles of respiration.  CARDIOVASCULAR: S1, S2 normal. No murmurs, rubs, or gallops.  ABDOMEN: Soft, nontender, nondistended. Bowel sounds present. No organomegaly or mass.  EXTREMITIES: No pedal edema, cyanosis, or clubbing.  NEUROLOGIC: she was in ER last night, so could not sleep - currently sleeping, moans and opens eye on stimuli- goes back to sleep. So not able to check. PSYCHIATRIC: The patient is sleepy SKIN: redness on right leg and foot,  On left ankle- an ulcer present due to her boot for fracture of leg few months ago.  Physical Exam LABORATORY PANEL:   CBC  Recent Labs Lab 06/27/15 0147  WBC 9.3  HGB 15.5  HCT 45.3  PLT 96*   ------------------------------------------------------------------------------------------------------------------  Chemistries    Recent Labs Lab 06/27/15 0147  NA 136  K 4.0  CL 99*  CO2 27  GLUCOSE 106*  BUN 19  CREATININE 0.62  CALCIUM 9.6  AST 22  ALT 11*  ALKPHOS 67  BILITOT 0.6   ------------------------------------------------------------------------------------------------------------------  Cardiac Enzymes  Recent Labs Lab 06/26/15 0330  TROPONINI <0.03   ------------------------------------------------------------------------------------------------------------------  RADIOLOGY:  Dg Ankle 2 Views Right  06/27/2015  CLINICAL DATA:  Acute onset of erythema, edema and tenderness at the right lower leg. Initial encounter. EXAM: RIGHT ANKLE - 2 VIEW COMPARISON:  None. FINDINGS: There is no evidence of fracture or dislocation. No osseous erosions are seen. The ankle mortise is intact; the interosseous space is within normal limits. No talar tilt or subluxation is seen. The joint spaces are preserved. Diffuse soft tissue swelling is noted, most prominent laterally. IMPRESSION: No evidence of fracture or dislocation. No osseous erosions seen. Diffuse soft tissue swelling noted, most prominent laterally. Electronically Signed   By: Garald Balding M.D.   On: 06/27/2015 01:59   US Venous Img Lower Unilateral Right  06/27/2015  CLINICAL DATA:  Patient nonverbal this morning. Swelling and redness with tenderness over the right lower extremity of unknown duration. EXAM: Right LOWER EXTREMITY VENOUS DOPPLER ULTRASOUND TECHNIQUE: Gray-scale sonography with graded compression, as well as color Doppler and duplex ultrasound were performed to evaluate the lower extremity deep venous systems from the level of the common femoral vein and including the common femoral, femoral, profunda femoral, popliteal and calf veins including the posterior tibial, peroneal and gastrocnemius veins when visible. The superficial great saphenous vein was also interrogated. Spectral  Doppler was utilized to evaluate flow at rest and with  distal augmentation maneuvers in the common femoral, femoral and popliteal veins. COMPARISON:  None. FINDINGS: Exam somewhat difficult due to subcutaneous edema. Contralateral Common Femoral Vein: Respiratory phasicity is normal and symmetric with the symptomatic side. No evidence of thrombus. Normal compressibility. Common Femoral Vein: No evidence of thrombus. Normal compressibility, respiratory phasicity and response to augmentation. Saphenofemoral Junction: No evidence of thrombus. Normal compressibility and flow on color Doppler imaging. Profunda Femoral Vein: No evidence of thrombus. Normal compressibility and flow on color Doppler imaging. Femoral Vein: No evidence of thrombus. Normal compressibility, respiratory phasicity and response to augmentation. Popliteal Vein: No evidence of thrombus. Normal compressibility, respiratory phasicity and response to augmentation. Calf Veins: Posterior tibial veins within normal. Perineal veins visualized with color Doppler although not with compression due to moderate edema. Superficial Great Saphenous Vein: No evidence of thrombus. Normal compressibility and flow on color Doppler imaging. Venous Reflux:  None. Other Findings:  None. IMPRESSION: No evidence of deep venous thrombosis. Electronically Signed   By: Marin Olp M.D.   On: 06/27/2015 09:24    ASSESSMENT AND PLAN:   Principal Problem:   Sepsis affecting skin   1.Sepsis, meeting septic criteria by tachycardia, respiratory rate present on arrival. Source cellulitis right leg. Panculture. Broad-spectrum antibiotics including vancomycin and taper antibiotics when culture data returns. Continue IV fluid hydration to keep mean arterial pressure greater than 65. He may require pressor therapy if blood pressure worsens.   2. Seizure disorder: Continue valproate 3. Venous thromboembolism prophylactic: Heparin subcutaneous 4. Lactic acidosis- due to sepsis, on IV fluids, monitor.   All the records are  reviewed and case discussed with Care Management/Social Workerr. Management plans discussed with the patient, family and they are in agreement.  CODE STATUS: full  TOTAL TIME TAKING CARE OF THIS PATIENT: 35 minutes.   Discussed with her mother and caretaker in room.  POSSIBLE D/C IN 2 DAYS, DEPENDING ON CLINICAL CONDITION.   Vaughan Basta M.D on 06/27/2015   Between 7am to 6pm - Pager - [phone]  After 6pm go to [email or web address] - password [email or web address] [facility]  Office  [phone]  CC: Primary care physician; [facility]  Note: This dictation was prepared with Dragon dictation along with smaller phrase technology. Any transcriptional errors that result from this process are unintentional.

## 2015-06-27 NOTE — H&P (Signed)
[place] at [place] NAME: Roberta Hampton    MR#:  [id number]  DATE OF BIRTH:  20-Jul-1966   DATE OF ADMISSION:  06/27/2015  PRIMARY CARE PHYSICIAN: [facility]   REQUESTING/REFERRING PHYSICIAN: Karma Greaser  CHIEF COMPLAINT:   Chief Complaint  Patient presents with  . Extremity Pain    right foot and leg.  redness, warmth and edema.    HISTORY OF PRESENT ILLNESS:  Roberta Hampton  is a 49 y.o. female with a known history of mental retardation, seizure disorder who is presenting with a right leg pain Patient unable to provide history given mental status/medical condition which is at baseline history obtained from caregiver at bedside. She states approximately 1 week duration of worsening redness with associated warmth, pain. She is unable to further quantify/qualify pain however the patient has been yelling whenever her leg is touched.   PAST MEDICAL HISTORY:   Past Medical History  Diagnosis Date  . Seizure disorder ([place])   . Hyponatremia     associated with polydipsia  . Obesity   . Mental retardation   . Attention deficit disorder   . Seizures ([place])     PAST SURGICAL HISTORY:   Past Surgical History  Procedure Laterality Date  . Vaginal hysterectomy    . Orif ankle fracture Left 05/08/2015    Procedure: OPEN REDUCTION INTERNAL FIXATION (ORIF) ANKLE FRACTURE;  Surgeon: Corky Mull, MD;  Location: [facility] ORS;  Service: Orthopedics;  Laterality: Left;    SOCIAL HISTORY:   Social History  Substance Use Topics  . Smoking status: Never Smoker   . Smokeless tobacco: Never Used  . Alcohol Use: No    FAMILY HISTORY:   Family History  Problem Relation Age of Onset  . Asthma Brother   . Seizures      paternal cousin    DRUG ALLERGIES:  No Known Allergies  REVIEW OF SYSTEMS:  Unable to obtain given patient's mental status/medical condition   MEDICATIONS AT HOME:   Prior to Admission medications    Medication Sig Start Date End Date Taking? Authorizing Provider  acetaminophen (TYLENOL) 325 MG tablet Take 2 tablets (650 mg total) by mouth every 6 (six) hours as needed for mild pain (or Fever >/= 101). 05/10/15  Yes Reche Dixon, PA-C  bisacodyl (DULCOLAX) 10 MG suppository Place 1 suppository (10 mg total) rectally daily as needed for moderate constipation. 05/10/15  Yes Reche Dixon, PA-C  Calcium Carb-Cholecalciferol (CALCIUM 600 + D PO) Take 600 mg by mouth 2 (two) times daily.   Yes Historical Provider, MD  docusate (COLACE) 50 MG/5ML liquid Take 50 mg by mouth 2 (two) times daily. One teaspoon   Yes Historical Provider, MD  fluvoxaMINE (LUVOX) 25 MG tablet Take 25 mg by mouth at bedtime.   Yes Historical Provider, MD  loratadine (CLARITIN) 10 MG tablet Take 10 mg by mouth daily.   Yes Historical Provider, MD  Multiple Vitamin (MULTIVITAMIN) tablet Take 1 tablet by mouth daily.   Yes Historical Provider, MD  nitrofurantoin, macrocrystal-monohydrate, (MACROBID) 100 MG capsule Take 100 mg by mouth 2 (two) times daily. 06/25/15 07/02/15 Yes Historical Provider, MD  oxyCODONE (OXY IR/ROXICODONE) 5 MG immediate release tablet Take 1-2 tablets (5-10 mg total) by mouth every 4 (four) hours as needed for breakthrough pain. Patient taking differently: Take 5 mg by mouth every 4 (four) hours as needed for breakthrough pain.  05/10/15  Yes Reche Dixon, PA-C  polyethylene  glycol (MIRALAX / GLYCOLAX) packet Take 17 g by mouth daily.   Yes Historical Provider, MD  QUEtiapine (SEROQUEL) 400 MG tablet Take 800 mg by mouth at bedtime.    Yes Historical Provider, MD  risperiDONE (RISPERDAL) 2 MG tablet Take 2 mg by mouth 2 (two) times daily.   Yes Historical Provider, MD  valproate (DEPAKENE) 250 MG/5ML syrup Take 10 mLs (500 mg total) by mouth 2 (two) times daily. 1 teaspoon Patient taking differently: Take 500 mg by mouth 2 (two) times daily.  02/27/15  Yes Dennie Bible, NP  vitamin E 1000 UNIT capsule Take  1,000 Units by mouth daily.   Yes Historical Provider, MD  docusate sodium (COLACE) 100 MG capsule Take 1 capsule (100 mg total) by mouth 2 (two) times daily. Patient not taking: Reported on 06/27/2015 05/10/15   Reche Dixon, PA-C  enoxaparin (LOVENOX) 40 MG/0.4ML injection Inject 0.4 mLs (40 mg total) into the skin daily. Patient not taking: Reported on 06/27/2015 05/10/15   Reche Dixon, PA-C  ondansetron (ZOFRAN) 4 MG tablet Take 1 tablet (4 mg total) by mouth every 6 (six) hours as needed for nausea. Patient not taking: Reported on 06/27/2015 05/10/15   Reche Dixon, PA-C  vitamin E 400 UNIT capsule Take 1 capsule (400 Units total) by mouth daily. Patient not taking: Reported on 06/27/2015 05/10/15   Reche Dixon, PA-C      VITAL SIGNS:  Blood pressure 102/75, pulse 106, temperature 97.6 F (36.4 C), temperature source Oral, resp. rate 18, SpO2 97 %.  PHYSICAL EXAMINATION:  VITAL SIGNS: Filed Vitals:   06/27/15 0315  BP:   Pulse: 106  Temp:   Resp:    GENERAL:48 y.o.female currently in no acute distress. Chronically ill-appearing HEAD: Normocephalic, atraumatic.  EYES: Pupils equal, round, reactive to light. Extraocular muscles intact. No scleral icterus.  MOUTH: Moist mucosal membrane. Dentition poor. No abscess noted.  EAR, NOSE, THROAT: Clear without exudates. No external lesions.  NECK: Supple. No thyromegaly. No nodules. No JVD.  PULMONARY: Clear to ascultation, without wheeze rails or rhonci. No use of accessory muscles, Good respiratory effort. good air entry bilaterally CHEST: Nontender to palpation.  CARDIOVASCULAR: S1 and S2. Tachycardic. No murmurs, rubs, or gallops. No edema. Pedal pulses 2+ bilaterally.  GASTROINTESTINAL: Soft, nontender, nondistended. No masses. Positive bowel sounds. No hepatosplenomegaly.  MUSCULOSKELETAL: No swelling, clubbing, or edema. Range of motion limited in left lower extremity given immobilization boot NEUROLOGIC: Cranial nerves II through XII are  intact. No gross focal neurological deficits. Sensation intact. Reflexes intact.  SKIN: Her right lower extremity erythema extending from the dorsum of foot to mid shin which is hot to touch, No further ulceration, lesions, rashes, or cyanosis. Skin warm and dry. Turgor intact.  PSYCHIATRIC: Mood, affect blunted. The patient is awake, oriented to self. Insight, judgment poor.    LABORATORY PANEL:   CBC  Recent Labs Lab 06/27/15 0147  WBC 9.3  HGB 15.5  HCT 45.3  PLT 96*   ------------------------------------------------------------------------------------------------------------------  Chemistries   Recent Labs Lab 06/27/15 0147  NA 136  K 4.0  CL 99*  CO2 27  GLUCOSE 106*  BUN 19  CREATININE 0.62  CALCIUM 9.6  AST 22  ALT 11*  ALKPHOS 67  BILITOT 0.6   ------------------------------------------------------------------------------------------------------------------  Cardiac Enzymes  Recent Labs Lab 06/26/15 0330  TROPONINI <0.03   ------------------------------------------------------------------------------------------------------------------  RADIOLOGY:  Dg Ankle 2 Views Right  06/27/2015  CLINICAL DATA:  Acute onset of erythema, edema and tenderness at the  right lower leg. Initial encounter. EXAM: RIGHT ANKLE - 2 VIEW COMPARISON:  None. FINDINGS: There is no evidence of fracture or dislocation. No osseous erosions are seen. The ankle mortise is intact; the interosseous space is within normal limits. No talar tilt or subluxation is seen. The joint spaces are preserved. Diffuse soft tissue swelling is noted, most prominent laterally. IMPRESSION: No evidence of fracture or dislocation. No osseous erosions seen. Diffuse soft tissue swelling noted, most prominent laterally. Electronically Signed   By: Garald Balding M.D.   On: 06/27/2015 01:59    EKG:   Orders placed or performed during the hospital encounter of 05/08/15  . ED EKG  . ED EKG  . EKG 12-Lead  .  EKG 12-Lead  . EKG 12-Lead  . EKG 12-Lead  . EKG    IMPRESSION AND PLAN:   49 year old Caucasian female history of seizure disorder and mental retardation presenting with right leg pain  1.Sepsis, meeting septic criteria by tachycardia, respiratory rate present on arrival. Source cellulitis right leg. Panculture. Broad-spectrum antibiotics including vancomycin and taper antibiotics when culture data returns.  Continue IV fluid hydration to keep mean arterial pressure greater than 65. He may require pressor therapy if blood pressure worsens. We will repeat lactic acid given the initial is greater than 2.2.  2. Seizure disorder: Continue valproate 3. Venous thromboembolism prophylactic: Heparin subcutaneous    All the records are reviewed and case discussed with ED provider. Management plans discussed with the patient, family and they are in agreement.  CODE STATUS: Full  TOTAL TIME TAKING CARE OF THIS PATIENT: 35 minutes.    Hower,  Karenann Cai.D on 06/27/2015 at 4:00 AM  Between 7am to 6pm - Pager - [phone]  After 6pm: House Pager: - [phone]  [facility]  Office  [phone]  CC: Primary care physician; [facility]

## 2015-06-27 NOTE — ED Provider Notes (Signed)
[facility] Emergency Department Provider Note  ____________________________________________  Time seen: Approximately 1:35 AM  I have reviewed the triage vital signs and the nursing notes.   HISTORY  Chief Complaint Extremity Pain  History is limited by the patient's mental retardation.  History is provided by the patient's caregiver from her group home.  HPI Roberta Hampton is a 49 y.o. female with history of mental retardation and a fracture to her left foot status postprocedure and wearing a walking boot.  She presents with concerns about pain, redness, and swelling in her right lower extremity that had an acute onset today and that has gotten worse rapidly.  The caregiver states that she did not notice it this morning but by this evening the entire foot was swollen and red with redness extending up about midway up the lower leg on the right side.  The patient is complaining that it is painful and hot.  There is no report of fever but the patient had a pulse of 1:15 when she came to the emergency department.  Movement and palpation make the pain worse and nothing makes it better.   Past Medical History  Diagnosis Date  . Seizure disorder ([place])   . Hyponatremia     associated with polydipsia  . Obesity   . Mental retardation   . Attention deficit disorder   . Seizures [facility])     Patient Active Problem List   Diagnosis Date Noted  . Sepsis affecting skin 06/27/2015  . Ankle fracture 05/08/2015  . 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 Encounter for therapeutic drug monitoring 07/22/2013  . Mental retardation 07/22/2013    Past Surgical History  Procedure Laterality Date  . Vaginal hysterectomy    . Orif ankle fracture Left 05/08/2015    Procedure: OPEN REDUCTION INTERNAL FIXATION (ORIF) ANKLE FRACTURE;  Surgeon: Corky Mull, MD;  Location: [facility] ORS;  Service: Orthopedics;  Laterality: Left;    No current  outpatient prescriptions on file.  Allergies Review of patient's allergies indicates no known allergies.  Family History  Problem Relation Age of Onset  . Asthma Brother   . Seizures      paternal cousin    Social History Social History  Substance Use Topics  . Smoking status: Never Smoker   . Smokeless tobacco: Never Used  . Alcohol Use: No    Review of Systems Unable to obtain from the patient due to mental retardation  ____________________________________________   PHYSICAL EXAM:  VITAL SIGNS: ED Triage Vitals  Enc Vitals Group     BP 06/26/15 2210 103/55 mmHg     Pulse Rate 06/26/15 2210 115     Resp 06/26/15 2210 18     Temp 06/26/15 2210 97.6 F (36.4 C)     Temp Source 06/26/15 2210 Oral     SpO2 06/26/15 2210 96 %     Weight --      Height --      Head Cir --      Peak Flow --      Pain Score 06/26/15 2218 8     Pain Loc --      Pain Edu? --      Excl. in [place]? --     Constitutional: Patient is sleeping and generally well-appearing, in no acute distress Eyes: Conjunctivae are normal. PERRL. EOMI. Head: Atraumatic. Nose: No congestion/rhinnorhea. Neck: No stridor.  No meningismus Cardiovascular: Tachycardia, regular rhythm. Grossly normal heart sounds.  Good peripheral circulation. Respiratory: Normal respiratory effort.  No retractions. Lungs CTAB. Gastrointestinal: Soft and nontender. No distention. No abdominal bruits. No CVA tenderness. Musculoskeletal: 1+ pitting edema in the right lower extremity from mid calf down through the entire foot.  See skin exam for more details. Neurologic:  Normal speech and language. No gross focal neurologic deficits are appreciated.  Skin:  Redness and some areas of nonblanching erythema in the right lower extremity.  The lower extremity is tender to touch and the patient will wake up and cry out when I am palpating it.  It is very warm to the touch and appears most consistent with cellulitis.  She also has a small  break in the skin in the anterior portion of her shin.   ____________________________________________   LABS (all labs ordered are listed, but only abnormal results are displayed)  Labs Reviewed  CBC WITH DIFFERENTIAL/PLATELET - Abnormal; Notable for the following:    Platelets 96 (*)    Neutro Abs 6.7 (*)    Lymphs Abs 0.5 (*)    Monocytes Absolute 1.9 (*)    All other components within normal limits  COMPREHENSIVE METABOLIC PANEL - Abnormal; Notable for the following:    Chloride 99 (*)    Glucose, Bld 106 (*)    ALT 11 (*)    All other components within normal limits  LACTIC ACID, PLASMA - Abnormal; Notable for the following:    Lactic Acid, Venous 2.5 (*)    All other components within normal limits  LACTIC ACID, PLASMA - Abnormal; Notable for the following:    Lactic Acid, Venous 2.8 (*)    All other components within normal limits  CULTURE, BLOOD (ROUTINE X 2)  CULTURE, BLOOD (ROUTINE X 2)  TROPONIN I  APTT  PROTIME-INR   ____________________________________________  EKG  Not indicated ____________________________________________  RADIOLOGY   Dg Ankle 2 Views Right  06/27/2015  CLINICAL DATA:  Acute onset of erythema, edema and tenderness at the right lower leg. Initial encounter. EXAM: RIGHT ANKLE - 2 VIEW COMPARISON:  None. FINDINGS: There is no evidence of fracture or dislocation. No osseous erosions are seen. The ankle mortise is intact; the interosseous space is within normal limits. No talar tilt or subluxation is seen. The joint spaces are preserved. Diffuse soft tissue swelling is noted, most prominent laterally. IMPRESSION: No evidence of fracture or dislocation. No osseous erosions seen. Diffuse soft tissue swelling noted, most prominent laterally. Electronically Signed   By: Garald Balding M.D.   On: 06/27/2015 01:59    ____________________________________________   PROCEDURES  Procedure(s) performed: None  Critical Care performed: Yes, see  critical care note(s)  CRITICAL CARE Performed by: Hinda Kehr   Total critical care time: 30 minutes  Critical care time was exclusive of separately billable procedures and treating other patients.  Critical care was necessary to treat or prevent imminent or life-threatening deterioration.  Critical care was time spent personally by me on the following activities: development of treatment plan with patient and/or surrogate as well as nursing, discussions with consultants, evaluation of patient's response to treatment, examination of patient, obtaining history from patient or surrogate, ordering and performing treatments and interventions, ordering and review of laboratory studies, ordering and review of radiographic studies, pulse oximetry and re-evaluation of patient's condition.   ____________________________________________   INITIAL IMPRESSION / ASSESSMENT AND PLAN / ED COURSE  Pertinent labs & imaging results that were available during my care of the patient were reviewed by me and considered  in my medical decision making (see chart for details).  The patient meets sepsis criteria given her elevated lactic acid and tachycardia even though she has a normal white blood cell count at this time.  I treated her aggressively with broad-spectrum antibiotics for cellulitis, as well as with 30 mL/kg normal saline bolus.  Ultrasound to rule out DVT is pending.  Admit the patient to the hospitalist.  Cultures were drawn prior to antibiotic administration.  ____________________________________________  FINAL CLINICAL IMPRESSION(S) / ED DIAGNOSES  Final diagnoses:  Cellulitis of right leg  Elevated lactic acid level  Sepsis    NEW MEDICATIONS STARTED DURING THIS VISIT:  Current Discharge Medication List       Hinda Kehr, MD 06/27/15 [phone]

## 2015-06-27 NOTE — Plan of Care (Signed)
Problem: Discharge Progression Outcomes Goal: Other Discharge Outcomes/Goals Outcome: Progressing Plan of Care Progress to Goal:   Pt BP is still low. Pt report left knee pain x1. Pt currently resting comfortably. No other signs of distress noted. Answered all questions asked by oncoming nurse.

## 2015-06-27 NOTE — Progress Notes (Signed)
  ANTIBIOTIC CONSULT NOTE - INITIAL  Pharmacy Consult for vancomycin dosing Indication: cellulitis  No Known Allergies  Patient Measurements: Height: 4\' 10"  (147.3 cm) Weight: 161 lb 8 oz (73.256 kg) IBW/kg (Calculated) : 40.9 Adjusted Body Weight: 54kg  Vital Signs: Temp: 97.6 F (36.4 C) (10/22 0539) Temp Source: Oral (10/22 0539) BP: 100/63 mmHg (10/22 0539) Pulse Rate: 100 (10/22 0539) Intake/Output from previous day:   Intake/Output from this shift:    Labs:  Recent Labs  06/27/15 0147  WBC 9.3  HGB 15.5  PLT 96*  CREATININE 0.62   Estimated Creatinine Clearance: 72.4 mL/min (by C-G formula based on Cr of 0.62). No results for input(s): VANCOTROUGH, VANCOPEAK, VANCORANDOM, GENTTROUGH, GENTPEAK, GENTRANDOM, TOBRATROUGH, TOBRAPEAK, TOBRARND, AMIKACINPEAK, AMIKACINTROU, AMIKACIN in the last 72 hours.   Microbiology: No results found for this or any previous visit (from the past 720 hour(s)).  Medical History: Past Medical History  Diagnosis Date  . Seizure disorder ([place])   . Hyponatremia     associated with polydipsia  . Obesity   . Mental retardation   . Attention deficit disorder   . Seizures ([facility])     Medications:   Assessment: Blood cx pending  Goal of Therapy:  Vancomycin trough level 15-20 mcg/ml  Plan:  TBW 73.3kg  IBW 40.9kg  DW 54kg Vd 38L kei 0.064 hr-1  t1/2 11 hours 750 mg q 12 hours ordered with stacked dosing. Level before 5th dose.  Jasmane Brockway S 06/27/2015,7:03 AM

## 2015-06-28 DIAGNOSIS — L8990 Pressure ulcer of unspecified site, unspecified stage: Secondary | ICD-10-CM | POA: Insufficient documentation

## 2015-06-28 MED ORDER — PIPERACILLIN-TAZOBACTAM 3.375 G IVPB
3.3750 g | Freq: Three times a day (TID) | INTRAVENOUS | Status: DC
  Administered 2015-06-28 – 2015-06-30 (×5): 3.375 g via INTRAVENOUS
  Filled 2015-06-28 (×10): qty 50

## 2015-06-28 MED ORDER — PIPERACILLIN-TAZOBACTAM 3.375 G IVPB 30 MIN
3.3750 g | Freq: Once | INTRAVENOUS | Status: AC
  Administered 2015-06-28: 3.375 g via INTRAVENOUS
  Filled 2015-06-28: qty 50

## 2015-06-28 NOTE — Plan of Care (Signed)
Problem: Discharge Progression Outcomes Goal: Other Discharge Outcomes/Goals Outcome: Progressing Planof care progress to goals: Discharge plan: Pt would like to return to [place] Pain controlled: Pt denies pain this shift but does call out/hollar when affected areas touches; otherwise resting sleeping.  Hemodynamically stable: VSS, continue to monitor labs.  Complications resolved/controlled: Wound on left medial ankle covered with pink Allevyn foam dressing dry, clean and intact and 7 steri strips to healing incision on left outside lower ext. Right lower leg brace off currently due to cleansing related to urinary incontinence- Right lower leg covered with pink Allevyn foam dressing dry, clean and intact. Left inner thigh, tender area on inner right thigh with redness noted. IVF infusing per MD order, IV Vancomycin continues. Tolerating Diet: pt consumed 75% of [place] sand tray, good appetite. No c/o nausea or vomiting.  Pt alert during assessment, denies pain, dressing to BL lower ankles clean/dry/intact. Wound consult pending.

## 2015-06-28 NOTE — Progress Notes (Signed)
[facility] at [place] NAME: Nezzie Manera    MR#:  [id number]  DATE OF BIRTH:  [birth year]  SUBJECTIVE:  CHIEF COMPLAINT:   Chief Complaint  Patient presents with  . Extremity Pain    right foot and leg.  redness, warmth and edema.   pt has no complains, more alert , she has some mental retardation.  REVIEW OF SYSTEMS:  Due to mental retardation and sleepy- not able to give ROS.   ROS  DRUG ALLERGIES:  No Known Allergies  VITALS:  Blood pressure 100/50, pulse 95, temperature 97.8 F (36.6 C), temperature source Oral, resp. rate 18, height 4\' 10"  (1.473 m), weight 73.256 kg (161 lb 8 oz), SpO2 98 %.  PHYSICAL EXAMINATION:  GENERAL:  49 y.o.-year-old patient lying in the bed with no acute distress.  EYES: Pupils equal, round, reactive to light. No scleral icterus. Extraocular muscles intact.  HEENT: Head atraumatic, normocephalic. Oropharynx and nasopharynx clear.  NECK:  Supple, no jugular venous distention. No thyroid enlargement, no tenderness.  LUNGS: Normal breath sounds bilaterally, no wheezing, rales,rhonchi or crepitation. No use of accessory muscles of respiration.  CARDIOVASCULAR: S1, S2 normal. No murmurs, rubs, or gallops.  ABDOMEN: Soft, nontender, nondistended. Bowel sounds present. No organomegaly or mass.  EXTREMITIES: No pedal edema, cyanosis, or clubbing.  NEUROLOGIC: due to mental retardation not following commands, but moving limbs spontaneously. SKIN: redness on right leg and foot,same as yesterday-  On left ankle- an ulcer present due to her boot for fracture of leg few months ago.  Physical Exam LABORATORY PANEL:   CBC  Recent Labs Lab 06/27/15 0147  WBC 9.3  HGB 15.5  HCT 45.3  PLT 96*   ------------------------------------------------------------------------------------------------------------------  Chemistries   Recent Labs Lab 06/27/15 0147  NA 136  K 4.0  CL 99*  CO2 27  GLUCOSE  106*  BUN 19  CREATININE 0.62  CALCIUM 9.6  AST 22  ALT 11*  ALKPHOS 67  BILITOT 0.6   ------------------------------------------------------------------------------------------------------------------  Cardiac Enzymes  Recent Labs Lab 06/26/15 0330  TROPONINI <0.03   ------------------------------------------------------------------------------------------------------------------  RADIOLOGY:  Dg Ankle 2 Views Right  06/27/2015  CLINICAL DATA:  Acute onset of erythema, edema and tenderness at the right lower leg. Initial encounter. EXAM: RIGHT ANKLE - 2 VIEW COMPARISON:  None. FINDINGS: There is no evidence of fracture or dislocation. No osseous erosions are seen. The ankle mortise is intact; the interosseous space is within normal limits. No talar tilt or subluxation is seen. The joint spaces are preserved. Diffuse soft tissue swelling is noted, most prominent laterally. IMPRESSION: No evidence of fracture or dislocation. No osseous erosions seen. Diffuse soft tissue swelling noted, most prominent laterally. Electronically Signed   By: Garald Balding M.D.   On: 06/27/2015 01:59   US Venous Img Lower Unilateral Right  06/27/2015  CLINICAL DATA:  Patient nonverbal this morning. Swelling and redness with tenderness over the right lower extremity of unknown duration. EXAM: Right LOWER EXTREMITY VENOUS DOPPLER ULTRASOUND TECHNIQUE: Gray-scale sonography with graded compression, as well as color Doppler and duplex ultrasound were performed to evaluate the lower extremity deep venous systems from the level of the common femoral vein and including the common femoral, femoral, profunda femoral, popliteal and calf veins including the posterior tibial, peroneal and gastrocnemius veins when visible. The superficial great saphenous vein was also interrogated. Spectral Doppler was utilized to evaluate flow at rest and with distal augmentation maneuvers in the common femoral, femoral  and popliteal  veins. COMPARISON:  None. FINDINGS: Exam somewhat difficult due to subcutaneous edema. Contralateral Common Femoral Vein: Respiratory phasicity is normal and symmetric with the symptomatic side. No evidence of thrombus. Normal compressibility. Common Femoral Vein: No evidence of thrombus. Normal compressibility, respiratory phasicity and response to augmentation. Saphenofemoral Junction: No evidence of thrombus. Normal compressibility and flow on color Doppler imaging. Profunda Femoral Vein: No evidence of thrombus. Normal compressibility and flow on color Doppler imaging. Femoral Vein: No evidence of thrombus. Normal compressibility, respiratory phasicity and response to augmentation. Popliteal Vein: No evidence of thrombus. Normal compressibility, respiratory phasicity and response to augmentation. Calf Veins: Posterior tibial veins within normal. Perineal veins visualized with color Doppler although not with compression due to moderate edema. Superficial Great Saphenous Vein: No evidence of thrombus. Normal compressibility and flow on color Doppler imaging. Venous Reflux:  None. Other Findings:  None. IMPRESSION: No evidence of deep venous thrombosis. Electronically Signed   By: Marin Olp M.D.   On: 06/27/2015 09:24    ASSESSMENT AND PLAN:   Principal Problem:   Sepsis affecting skin Active Problems:   Pressure ulcer  1.Sepsis,   Due to cellulitis  on vanc+ Zosyn.  still have redness, continue ABx. 2. Seizure disorder: Continue valproate 3. Venous thromboembolism prophylactic: Heparin subcutaneous 4. Lactic acidosis- due to sepsis, on IV fluids, monitor.  All the records are reviewed and case discussed with Care Management/Social Workerr. Management plans discussed with the patient, family and they are in agreement.  CODE STATUS: full  TOTAL TIME TAKING CARE OF THIS PATIENT: 35 minutes.   Discussed with her mother in room.  POSSIBLE D/C IN 2 DAYS, DEPENDING ON CLINICAL  CONDITION.   Vaughan Basta M.D on 06/28/2015   Between 7am to 6pm - Pager - [phone]  After 6pm go to [email or web address] - password [email or web address] [facility]  Office  [phone]  CC: Primary care physician; [facility] C  Note: This dictation was prepared with Dragon dictation along with smaller phrase technology. Any transcriptional errors that result from this process are unintentional.

## 2015-06-28 NOTE — Progress Notes (Signed)
ANTIBIOTIC CONSULT NOTE - INITIAL  Pharmacy Consult for piperacillin/tazobactam  Indication: rule out sepsis, cellulitis  No Known Allergies  Patient Measurements: Height: 4\' 10"  (147.3 cm) Weight: 161 lb 8 oz (73.256 kg) IBW/kg (Calculated) : 40.9  Vital Signs: Temp: 97.8 F (36.6 C) (10/23 1255) Temp Source: Oral (10/23 1255) BP: 100/50 mmHg (10/23 1255) Pulse Rate: 95 (10/23 1255) Intake/Output from previous day: 10/22 0701 - 10/23 0700 In: 6546 [P.O.:240; I.V.:1375; IV Piggyback:150] Out: 1 [Urine:1] Intake/Output from this shift: Total I/O In: 480 [P.O.:480] Out: -   Labs:  Recent Labs  06/27/15 0147  WBC 9.3  HGB 15.5  PLT 96*  CREATININE 0.62   Estimated Creatinine Clearance: 72.4 mL/min (by C-G formula based on Cr of 0.62). No results for input(s): VANCOTROUGH, VANCOPEAK, VANCORANDOM, GENTTROUGH, GENTPEAK, GENTRANDOM, TOBRATROUGH, TOBRAPEAK, TOBRARND, AMIKACINPEAK, AMIKACINTROU, AMIKACIN in the last 72 hours.   Microbiology: Recent Results (from the past 720 hour(s))  Blood culture (routine x 2)     Status: None (Preliminary result)   Collection Time: 06/27/15  1:46 AM  Result Value Ref Range Status   Specimen Description BLOOD LEFT ASSIST CONTROL  Final   Special Requests BOTTLES DRAWN AEROBIC AND ANAEROBIC 4CC  Final   Culture  Setup Time   Final    GRAM POSITIVE COCCI IN CLUSTERS AEROBIC BOTTLE ONLY CRITICAL RESULT CALLED TO, READ BACK BY AND VERIFIED WITH: JANE HODGE AT 1530 06/28/15 DV    Culture GRAM POSITIVE COCCI IDENTIFICATION TO FOLLOW   Final   Report Status PENDING  Incomplete  Blood culture (routine x 2)     Status: None (Preliminary result)   Collection Time: 06/27/15  1:47 AM  Result Value Ref Range Status   Specimen Description BLOOD RIGHT ASSIST CONTROL  Final   Special Requests BOTTLES DRAWN AEROBIC AND ANAEROBIC 3CC  Final   Culture NO GROWTH 1 DAY  Final   Report Status PENDING  Incomplete  MRSA PCR Screening     Status: None    Collection Time: 06/27/15 12:15 PM  Result Value Ref Range Status   MRSA by PCR NEGATIVE NEGATIVE Final    Comment:        The GeneXpert MRSA Assay (FDA approved for NASAL specimens only), is one component of a comprehensive MRSA colonization surveillance program. It is not intended to diagnose MRSA infection nor to guide or monitor treatment for MRSA infections.     Medical History: Past Medical History  Diagnosis Date  . Seizure disorder ([place])   . Hyponatremia     associated with polydipsia  . Obesity   . Mental retardation   . Attention deficit disorder   . Seizures ([place])     Medications:  Anti-infectives    Start     Dose/Rate Route Frequency Ordered Stop   06/28/15 2200  piperacillin-tazobactam (ZOSYN) IVPB 3.375 g     3.375 g 12.5 mL/hr over 240 Minutes Intravenous 3 times per day 06/28/15 1533     06/28/15 1630  piperacillin-tazobactam (ZOSYN) IVPB 3.375 g     3.375 g 100 mL/hr over 30 Minutes Intravenous  Once 06/28/15 1527     06/27/15 1400  vancomycin (VANCOCIN) IVPB 750 mg/150 ml premix     750 mg 150 mL/hr over 60 Minutes Intravenous Every 12 hours 06/27/15 0703     06/27/15 0245  piperacillin-tazobactam (ZOSYN) IVPB 3.375 g     3.375 g 100 mL/hr over 30 Minutes Intravenous  Once 06/27/15 0234 06/27/15 0357   06/27/15 0245  vancomycin (VANCOCIN) IVPB 1000 mg/200 mL premix     1,000 mg 200 mL/hr over 60 Minutes Intravenous  Once 06/27/15 0234 06/27/15 0456     Assessment: Pharmacy consulted to dose piperacillin/tazobactam on this 49 year old female for sepsis secondary to cellulitis. Patient is already receiving vancomycin.   Blood culture: GPC x1 bottle, ID and susceptibilities pending MRSA PCR screening negative  Plan:  Follow cultures Start piperacillin/tazobactam 3.375 g IV q8h EI  Thank you for the consult. Darylene Price Max Romano 06/28/2015,3:34 PM

## 2015-06-28 NOTE — Plan of Care (Signed)
Problem: Discharge Progression Outcomes Goal: Other Discharge Outcomes/Goals 1. Plan to return to [place] . Mother very supportive. 2. Tylenol given x 1 this shift for left leg pain with adequate pain control reported. 3. Afebrile. VSS. Condition stable.  4. Vancomycin IV continued with IVF's continued. No observable difference in reddness of right lower leg with right inner thigh site less prominent. Dr. Anselm Jungling advised of right upper inner thigh area and reports no new orders after check.  5. Pt more alert, talkative, cooperative today. Feeding self meals after set up; drinking more than eating solid foods. No stool today thus far with miralax given

## 2015-06-28 NOTE — Progress Notes (Signed)
Received TC call from lab that + blood culture in aerobic bottle with gram + cocci. TC report called to Dr. Anselm Jungling with pt currently receiving vancomycin and zosyn. He will review lab report and current orders.

## 2015-06-29 LAB — VANCOMYCIN, TROUGH: Vancomycin Tr: 10 ug/mL (ref 10–20)

## 2015-06-29 MED ORDER — ENOXAPARIN SODIUM 40 MG/0.4ML ~~LOC~~ SOLN
40.0000 mg | SUBCUTANEOUS | Status: DC
  Filled 2015-06-29: qty 0.4

## 2015-06-29 MED ORDER — VANCOMYCIN HCL IN DEXTROSE 1-5 GM/200ML-% IV SOLN
1000.0000 mg | Freq: Two times a day (BID) | INTRAVENOUS | Status: DC
  Administered 2015-06-29 – 2015-06-30 (×2): 1000 mg via INTRAVENOUS
  Filled 2015-06-29 (×5): qty 200

## 2015-06-29 NOTE — Clinical Documentation Improvement (Signed)
Internal Medicine   Please provide greater specificity of the diagnosis of "unspecified intellectual disabilities" if appropriate for this admission?  Thank you     Borderline  Mild   Moderate  Profound  Severe  Other  Clinically Undetermined    Please exercise your independent, professional judgment when responding. A specific answer is not anticipated or expected.   Thank You, [place] [phone]

## 2015-06-29 NOTE — Consult Note (Addendum)
WOC wound consult note Reason for Consult: Device Related pressure injury to left medial malleolus from multipodus boot.  Foot fracture a month ago.  Began at this time.  Wound type: Full thickness (unstageable pressure injury to left medial malleolus) present on admission.  Cellulitis to right lower leg, near malleolus, intact skin at this time.  Pressure Ulcer POA: Yes unstageable Measurement: LEft medial malleolus 3.2 cm x 2.4 cm x 0.2 cm with 75% scabbed, devitalized tissue in wound bed.  Will treat conservatively to promote autolysis and pad this area.  Drainage (amount, consistency, odor) Minimal serosanguinous drainage.  NO odor.  Periwound:Intact Right lower leg is erythematous and tender to touch.  Intact with margins of cellulitis marked and appear to be improving.  Dressing procedure/placement/frequency:Cleanse ulcer to left medial malleolus with soap and water and pat dry.  Apply Allevyn silicone border dressing.  Change every 3 days. Continue to monitor right lower leg and reconsult if skin opens.  Will not follow at this time.  Please re-consult if needed.  Domenic Moras RN BSN [place] Pager [phone]

## 2015-06-29 NOTE — Plan of Care (Signed)
Problem: Discharge Progression Outcomes Goal: Other Discharge Outcomes/Goals Outcome: Progressing Planof care progress to goals: Discharge plan: Pt would like to return to [place] at time of discharge. Pain controlled: No c/o pain this shift, otherwise rested well.   Hemodynamically stable: VSS, continue to monitor labs. IV antibiotics given per order.  Complications resolved/controlled: Wound on left medial ankle covered with pink Allevyn foam dressing dry, clean and intact and 7 steri strips to healing incision on left outside lower ext. Right lower leg covered with pink Allevyn foam dressing dry, clean and intact.Tolerating Diet: tolerating diet; no c/o nausea or vomiting. Pt alert during assessment, denies pain, dressing to BL lower ankles clean/dry/intact. IVF antibiotics infused per order. Wound consult pending.

## 2015-06-29 NOTE — Care Management Important Message (Signed)
Important Message  Patient Details  Name: Roberta Hampton MRN: [id number] Date of Birth: [birth year]   Medicare Important Message Given:  Yes-second notification given    Juliann Pulse A Allmond 06/29/2015, 10:15 AM

## 2015-06-29 NOTE — Plan of Care (Signed)
Problem: Discharge Progression Outcomes Goal: Other Discharge Outcomes/Goals Outcome: Progressing Planof care progress to goals:  Discharge plan: Pt would like to return to [place] at time of discharge. Pain controlled: No c/o pain this shift.    Hemodynamically stable: VSS, continue to monitor labs. IV antibiotics given per order.   Complications resolved/controlled: Wound on left medial ankle covered with pink Allevyn foam dressing dry, clean and intact and 7 steri strips to healing incision on left outside lower ext. Right lower leg covered with pink Allevyn foam dressing dry, clean and intact.Tolerating Diet: tolerating diet; no c/o nausea or vomiting. Pt alert during assessment, denies pain, dressing to BL lower ankles clean/dry/intact. IVF antibiotics infused per order. Wound RN made Rounds.

## 2015-06-29 NOTE — Progress Notes (Signed)
  ANTIBIOTIC CONSULT NOTE - INITIAL  Pharmacy Consult for vancomycin dosing Indication: cellulitis  No Known Allergies  Patient Measurements: Height: 4\' 10"  (147.3 cm) Weight: 161 lb 8 oz (73.256 kg) IBW/kg (Calculated) : 40.9 Adjusted Body Weight: 54kg  Vital Signs: Temp: 98.6 F (37 C) (10/23 2159) BP: 109/67 mmHg (10/23 2159) Pulse Rate: 82 (10/23 2159) Intake/Output from previous day: 10/23 0701 - 10/24 0700 In: 2311 [P.O.:720; IV Piggyback:1591] Out: -  Intake/Output from this shift: Total I/O In: 50 [IV Piggyback:50] Out: -   Labs:  Recent Labs  06/27/15 0147  WBC 9.3  HGB 15.5  PLT 96*  CREATININE 0.62   Estimated Creatinine Clearance: 72.4 mL/min (by C-G formula based on Cr of 0.62). No results for input(s): VANCOTROUGH, VANCOPEAK, VANCORANDOM, GENTTROUGH, GENTPEAK, GENTRANDOM, TOBRATROUGH, TOBRAPEAK, TOBRARND, AMIKACINPEAK, AMIKACINTROU, AMIKACIN in the last 72 hours.   Microbiology: Recent Results (from the past 720 hour(s))  Blood culture (routine x 2)     Status: None (Preliminary result)   Collection Time: 06/27/15  1:46 AM  Result Value Ref Range Status   Specimen Description BLOOD LEFT ASSIST CONTROL  Final   Special Requests BOTTLES DRAWN AEROBIC AND ANAEROBIC 4CC  Final   Culture  Setup Time   Final    GRAM POSITIVE COCCI IN CLUSTERS AEROBIC BOTTLE ONLY CRITICAL RESULT CALLED TO, READ BACK BY AND VERIFIED WITH: JANE HODGE AT 1530 06/28/15 DV    Culture GRAM POSITIVE COCCI IDENTIFICATION TO FOLLOW   Final   Report Status PENDING  Incomplete  Blood culture (routine x 2)     Status: None (Preliminary result)   Collection Time: 06/27/15  1:47 AM  Result Value Ref Range Status   Specimen Description BLOOD RIGHT ASSIST CONTROL  Final   Special Requests BOTTLES DRAWN AEROBIC AND ANAEROBIC 3CC  Final   Culture NO GROWTH 1 DAY  Final   Report Status PENDING  Incomplete  MRSA PCR Screening     Status: None   Collection Time: 06/27/15 12:15 PM   Result Value Ref Range Status   MRSA by PCR NEGATIVE NEGATIVE Final    Comment:        The GeneXpert MRSA Assay (FDA approved for NASAL specimens only), is one component of a comprehensive MRSA colonization surveillance program. It is not intended to diagnose MRSA infection nor to guide or monitor treatment for MRSA infections.     Medical History: Past Medical History  Diagnosis Date  . Seizure disorder ([place])   . Hyponatremia     associated with polydipsia  . Obesity   . Mental retardation   . Attention deficit disorder   . Seizures ([facility])     Medications:   Assessment: Blood cx pending  Goal of Therapy:  Vancomycin trough level 15-20 mcg/ml  Plan:  TBW 73.3kg  IBW 40.9kg  DW 54kg Vd 38L kei 0.064 hr-1  t1/2 11 hours 750 mg q 12 hours ordered with stacked dosing. Level before 5th dose.  10/24 01:30 vanc level 10. Changed to 1 gram q 12 hours. Level before 4th new dose.    Sergei Delo S 06/29/2015,5:08 AM

## 2015-06-29 NOTE — Progress Notes (Signed)
[place] at [place] NAME: Roberta Hampton    MR#:  [id number]  DATE OF BIRTH:  [birth year]  SUBJECTIVE:  CHIEF COMPLAINT:   Chief Complaint  Patient presents with  . Extremity Pain    right foot and leg.  redness, warmth and edema.   pt has no complains, more alert , she has some mental retardation.  REVIEW OF SYSTEMS:  Due to mental retardation - not able to give ROS.   ROS  DRUG ALLERGIES:  No Known Allergies  VITALS:  Blood pressure 92/70, pulse 87, temperature 97.3 F (36.3 C), temperature source Oral, resp. rate 20, height 4\' 10"  (1.473 m), weight 73.256 kg (161 lb 8 oz), SpO2 97 %.  PHYSICAL EXAMINATION:  GENERAL:  49 y.o.-year-old patient lying in the bed with no acute distress.  EYES: Pupils equal, round, reactive to light. No scleral icterus. Extraocular muscles intact.  HEENT: Head atraumatic, normocephalic. Oropharynx and nasopharynx clear.  NECK:  Supple, no jugular venous distention. No thyroid enlargement, no tenderness.  LUNGS: Normal breath sounds bilaterally, no wheezing, rales,rhonchi or crepitation. No use of accessory muscles of respiration.  CARDIOVASCULAR: S1, S2 normal. No murmurs, rubs, or gallops.  ABDOMEN: Soft, nontender, nondistended. Bowel sounds present. No organomegaly or mass.  EXTREMITIES: No pedal edema, cyanosis, or clubbing.  NEUROLOGIC: due to mental retardation not following commands, but moving limbs spontaneously. SKIN: redness on right leg and foot,little better than yesterday-  On left ankle- an ulcer present due to her boot for fracture of leg few months ago.  Physical Exam LABORATORY PANEL:   CBC  Recent Labs Lab 06/27/15 0147  WBC 9.3  HGB 15.5  HCT 45.3  PLT 96*   ------------------------------------------------------------------------------------------------------------------  Chemistries   Recent Labs Lab 06/27/15 0147  NA 136  K 4.0  CL 99*  CO2 27  GLUCOSE  106*  BUN 19  CREATININE 0.62  CALCIUM 9.6  AST 22  ALT 11*  ALKPHOS 67  BILITOT 0.6   ------------------------------------------------------------------------------------------------------------------  Cardiac Enzymes  Recent Labs Lab 06/26/15 0330  TROPONINI <0.03   ------------------------------------------------------------------------------------------------------------------  RADIOLOGY:  No results found.  ASSESSMENT AND PLAN:   Principal Problem:   Sepsis affecting skin Active Problems:   Pressure ulcer  1.Sepsis,   Due to cellulitis  on vanc+ Zosyn.  still have redness, continue ABx.   Better than yesterday- likely tomorrow- d/c back to her group home on oral augmentin.   She may need visiting nurse for wound on left ankle. 2. Seizure disorder: Continue valproate 3. Venous thromboembolism prophylactic: Heparin subcutaneous 4. Lactic acidosis- due to sepsis, on IV fluids, monitor.  All the records are reviewed and case discussed with Care Management/Social Workerr. Management plans discussed with the patient, family and they are in agreement.  CODE STATUS: full  TOTAL TIME TAKING CARE OF THIS PATIENT: 35 minutes.   Discussed with her mother in room.  POSSIBLE D/C IN 1-2 DAYS, DEPENDING ON CLINICAL CONDITION.   Vaughan Basta M.D on 06/29/2015   Between 7am to 6pm - Pager - [phone]  After 6pm go to [email or web address] - password [email or web address] [facility]  Office  [phone]  CC: Primary care physician; [facility] Acute C  Note: This dictation was prepared with Dragon dictation along with smaller phrase technology. Any transcriptional errors that result from this process are unintentional.

## 2015-06-30 MED ORDER — OXYCODONE HCL 5 MG PO TABS: 5.0000 mg | ORAL_TABLET | Freq: Four times a day (QID) | ORAL | Status: DC | PRN

## 2015-06-30 MED ORDER — AMOXICILLIN-POT CLAVULANATE 250-62.5 MG/5ML PO SUSR: 500.0000 mg | Freq: Three times a day (TID) | ORAL | Status: AC

## 2015-06-30 NOTE — Discharge Instructions (Signed)
Cleanse ulcer to left medial malleolus with soap and water and pat dry.  Apply Allevyn silicone border dressing.  Change every 3 days. Continue to monitor right lower leg and reconsult if skin opens.

## 2015-06-30 NOTE — Care Management (Signed)
Patient discharging today back to [facility].  CSW following case.  MD order for [facility] for wound care ordered.  Patient unable to make medical decisions.  Per Gena Fray from [facility] no preference for [facility] providers.  Referral made to [facility].  Corene Cornea from [facility] notified.  RNCM signing off

## 2015-06-30 NOTE — Plan of Care (Signed)
Problem: Discharge Progression Outcomes Goal: Other Discharge Outcomes/Goals Outcome: Progressing Plan of Care Progress to Goal:   Pt report pain once during shift. Pt has been resting comfortably this shift. No other signs of distress noted. Will continue to monitor.

## 2015-06-30 NOTE — Discharge Summary (Addendum)
[place] [facility] [place] NAME: Roberta Hampton    MR#:  [id number]  DATE OF BIRTH:  03/20/1966  DATE OF ADMISSION:  06/27/2015 ADMITTING PHYSICIAN: Lytle Butte, MD  DATE OF DISCHARGE: 06/30/2015  PRIMARY CARE PHYSICIAN: [facility]    ADMISSION DIAGNOSIS:  Cellulitis of right leg [L93.790] Elevated lactic acid level [E87.2]  DISCHARGE DIAGNOSIS:  Principal Problem:   Sepsis affecting skin Active Problems:   Pressure ulcer   SECONDARY DIAGNOSIS:   Past Medical History  Diagnosis Date  . Seizure disorder ([place])   . Hyponatremia     associated with polydipsia  . Obesity   . Mental retardation   . Attention deficit disorder   . Seizures ([place])     HOSPITAL COURSE:   1.Sepsis,  Due to cellulitis on right leg on vanc+ Zosyn. still have redness, continue ABx.  gradual clearing. Seen by wound care nurse.  She may need visiting nurse for wound on left ankle.   Oral augmentin for 8 days. 2. Seizure disorder: Continue valproate 3. Venous thromboembolism prophylactic: Heparin subcutaneous 4. Lactic acidosis- due to sepsis, on IV fluids, monitor. 5. Pressure ulcer / wound on left ankle- seen by wound care- dressing. 6. Severe cognitive impairment- due to mental retardation- at baseline- lives in group home.  DISCHARGE CONDITIONS:   Stable.  CONSULTS OBTAINED:  Treatment Team:  Lytle Butte, MD  DRUG ALLERGIES:  No Known Allergies  DISCHARGE MEDICATIONS:   Current Discharge Medication List    START taking these medications   Details  amoxicillin-clavulanate (AUGMENTIN) 250-62.5 MG/5ML suspension Take 10 mLs (500 mg total) by mouth 3 (three) times daily. Qty: 150 mL, Refills: 1      CONTINUE these medications which have CHANGED   Details  oxyCODONE (OXY IR/ROXICODONE) 5 MG immediate release tablet Take 1 tablet (5 mg total) by mouth every 6 (six) hours as needed for breakthrough pain. Qty:  20 tablet, Refills: 0      CONTINUE these medications which have NOT CHANGED   Details  acetaminophen (TYLENOL) 325 MG tablet Take 2 tablets (650 mg total) by mouth every 6 (six) hours as needed for mild pain (or Fever >/= 101). Qty: 30 tablet, Refills: 0    bisacodyl (DULCOLAX) 10 MG suppository Place 1 suppository (10 mg total) rectally daily as needed for moderate constipation. Qty: 12 suppository, Refills: 0    Calcium Carb-Cholecalciferol (CALCIUM 600 + D PO) Take 600 mg by mouth 2 (two) times daily.    docusate (COLACE) 50 MG/5ML liquid Take 50 mg by mouth 2 (two) times daily. One teaspoon    fluvoxaMINE (LUVOX) 25 MG tablet Take 25 mg by mouth at bedtime.    loratadine (CLARITIN) 10 MG tablet Take 10 mg by mouth daily.    Multiple Vitamin (MULTIVITAMIN) tablet Take 1 tablet by mouth daily.    nitrofurantoin, macrocrystal-monohydrate, (MACROBID) 100 MG capsule Take 100 mg by mouth 2 (two) times daily.    polyethylene glycol (MIRALAX / GLYCOLAX) packet Take 17 g by mouth daily.    QUEtiapine (SEROQUEL) 400 MG tablet Take 800 mg by mouth at bedtime.     risperiDONE (RISPERDAL) 2 MG tablet Take 2 mg by mouth 2 (two) times daily.    valproate (DEPAKENE) 250 MG/5ML syrup Take 10 mLs (500 mg total) by mouth 2 (two) times daily. 1 teaspoon Qty: 600 mL, Refills: 5    !! vitamin E 1000 UNIT capsule Take 1,000 Units  by mouth daily.    ondansetron (ZOFRAN) 4 MG tablet Take 1 tablet (4 mg total) by mouth every 6 (six) hours as needed for nausea. Qty: 20 tablet, Refills: 0    !! vitamin E 400 UNIT capsule Take 1 capsule (400 Units total) by mouth daily. Qty: 10 capsule, Refills: 0     !! - Potential duplicate medications found. Please discuss with provider.    STOP taking these medications     docusate sodium (COLACE) 100 MG capsule      enoxaparin (LOVENOX) 40 MG/0.4ML injection          DISCHARGE INSTRUCTIONS:   Wound care  Cleanse ulcer to left medial malleolus with  soap and water and pat dry.  Apply Allevyn silicone border dressing.  Change every 3 days. Continue to monitor right lower leg and reconsult if skin opens. Follow with PMD in 1-2 weeks.  If you experience worsening of your admission symptoms, develop shortness of breath, life threatening emergency, suicidal or homicidal thoughts you must seek medical attention immediately by calling 911 or calling your MD immediately  if symptoms less severe.  You Must read complete instructions/literature along with all the possible adverse reactions/side effects for all the Medicines you take and that have been prescribed to you. Take any new Medicines after you have completely understood and accept all the possible adverse reactions/side effects.   Please note  You were cared for by a hospitalist during your hospital stay. If you have any questions about your discharge medications or the care you received while you were in the hospital after you are discharged, you can call the unit and asked to speak with the hospitalist on call if the hospitalist that took care of you is not available. Once you are discharged, your primary care physician will handle any further medical issues. Please note that NO REFILLS for any discharge medications will be authorized once you are discharged, as it is imperative that you return to your primary care physician (or establish a relationship with a primary care physician if you do not have one) for your aftercare needs so that they can reassess your need for medications and monitor your lab values.    Today   CHIEF COMPLAINT:   Chief Complaint  Patient presents with  . Extremity Pain    right foot and leg.  redness, warmth and edema.    HISTORY OF PRESENT ILLNESS:  Roberta Hampton  is a 49 y.o. female with a known history of mental retardation, seizure disorder who is presenting with a right leg pain Patient unable to provide history given mental status/medical condition  which is at baseline history obtained from caregiver at bedside. She states approximately 1 week duration of worsening redness with associated warmth, pain. She is unable to further quantify/qualify pain however the patient has been yelling whenever her leg is touched.    VITAL SIGNS:  Blood pressure 101/62, pulse 82, temperature 98.2 F (36.8 C), temperature source Oral, resp. rate 19, height 4\' 10"  (1.473 m), weight 73.256 kg (161 lb 8 oz), SpO2 98 %.  I/O:    Intake/Output Summary (Last 24 hours) at 06/30/15 1457 Last data filed at 06/30/15 1300  Gross per 24 hour  Intake    480 ml  Output      0 ml  Net    480 ml    PHYSICAL EXAMINATION:   GENERAL: 49 y.o.-year-old patient lying in the bed with no acute distress.  EYES: Pupils equal,  round, reactive to light. No scleral icterus. Extraocular muscles intact.  HEENT: Head atraumatic, normocephalic. Oropharynx and nasopharynx clear.  NECK: Supple, no jugular venous distention. No thyroid enlargement, no tenderness.  LUNGS: Normal breath sounds bilaterally, no wheezing, rales,rhonchi or crepitation. No use of accessory muscles of respiration.  CARDIOVASCULAR: S1, S2 normal. No murmurs, rubs, or gallops.  ABDOMEN: Soft, nontender, nondistended. Bowel sounds present. No organomegaly or mass.  EXTREMITIES: No pedal edema, cyanosis, or clubbing.  NEUROLOGIC: due to mental retardation not following commands, but moving limbs spontaneously. SKIN: redness on right leg and foot,little better than yesterday- On left ankle- an ulcer present due to her boot for fracture of leg few months ago.  DATA REVIEW:   CBC  Recent Labs Lab 06/27/15 0147  WBC 9.3  HGB 15.5  HCT 45.3  PLT 96*    Chemistries   Recent Labs Lab 06/27/15 0147  NA 136  K 4.0  CL 99*  CO2 27  GLUCOSE 106*  BUN 19  CREATININE 0.62  CALCIUM 9.6  AST 22  ALT 11*  ALKPHOS 67  BILITOT 0.6    Cardiac Enzymes  Recent Labs Lab 06/26/15 0330   TROPONINI <0.03    Microbiology Results  Results for orders placed or performed during the hospital encounter of 06/27/15  Blood culture (routine x 2)     Status: None (Preliminary result)   Collection Time: 06/27/15  1:46 AM  Result Value Ref Range Status   Specimen Description BLOOD LEFT ASSIST CONTROL  Final   Special Requests BOTTLES DRAWN AEROBIC AND ANAEROBIC 4CC  Final   Culture  Setup Time   Final    GRAM POSITIVE COCCI IN CLUSTERS AEROBIC BOTTLE ONLY CRITICAL RESULT CALLED TO, READ BACK BY AND VERIFIED WITH: JANE HODGE AT 1530 06/28/15 DV    Culture   Final    COAGULASE NEGATIVE STAPHYLOCOCCUS AEROBIC BOTTLE ONLY Results consistent with contamination.    Report Status PENDING  Incomplete  Blood culture (routine x 2)     Status: None (Preliminary result)   Collection Time: 06/27/15  1:47 AM  Result Value Ref Range Status   Specimen Description BLOOD RIGHT ASSIST CONTROL  Final   Special Requests BOTTLES DRAWN AEROBIC AND ANAEROBIC 3CC  Final   Culture NO GROWTH 3 DAYS  Final   Report Status PENDING  Incomplete  MRSA PCR Screening     Status: None   Collection Time: 06/27/15 12:15 PM  Result Value Ref Range Status   MRSA by PCR NEGATIVE NEGATIVE Final    Comment:        The GeneXpert MRSA Assay (FDA approved for NASAL specimens only), is one component of a comprehensive MRSA colonization surveillance program. It is not intended to diagnose MRSA infection nor to guide or monitor treatment for MRSA infections.     RADIOLOGY:  No results found.  EKG:   Orders placed or performed during the hospital encounter of 05/08/15  . ED EKG  . ED EKG  . EKG 12-Lead  . EKG 12-Lead  . EKG 12-Lead  . EKG 12-Lead  . EKG      Management plans discussed with the patient, family and they are in agreement.  CODE STATUS:     Code Status Orders        Start     Ordered   06/27/15 0352  Full code   Continuous     06/27/15 0351      TOTAL TIME TAKING CARE  OF THIS  PATIENT: 35 minutes.    Vaughan Basta M.D on 06/30/2015 at 2:57 PM  Between 7am to 6pm - Pager - [phone]  After 6pm go to [email or web address] - password [email or web address] [place] [facility]  Office  [phone]  CC: Primary care physician; [facility]   Note: This dictation was prepared with Dragon dictation along with smaller phrase technology. Any transcriptional errors that result from this process are unintentional.

## 2015-07-02 LAB — CULTURE, BLOOD (ROUTINE X 2): Culture: NO GROWTH

## 2015-07-07 NOTE — Progress Notes (Signed)
[place], [place].   07/07/2015  Patient: Roberta Hampton   Date of Birth:  Jan 01, 1966  Date of admission:  06/27/2015  Date of Discharge  06/30/2015    To Whom it May Concern:   Roberta Hampton  may return to work on 07/07/15.  PHYSICAL ACTIVITY:  Full  If you have any questions or concerns, please don't hesitate to call.  Sincerely,   Vaughan Basta M.D Pager [phone] Office : [phone]   .

## 2015-07-10 ENCOUNTER — Other Ambulatory Visit: Payer: Self-pay | Admitting: Family Medicine

## 2015-07-10 DIAGNOSIS — M79661 Pain in right lower leg: Secondary | ICD-10-CM

## 2015-07-10 DIAGNOSIS — M7989 Other specified soft tissue disorders: Principal | ICD-10-CM

## 2015-07-13 ENCOUNTER — Ambulatory Visit: Payer: Medicare Other

## 2015-07-13 ENCOUNTER — Ambulatory Visit
Admission: RE | Admit: 2015-07-13 | Discharge: 2015-07-13 | Disposition: A | Discharge status: Home / Self Care | Payer: Medicare Other | Source: Ambulatory Visit | Attending: Family Medicine | Admitting: Family Medicine

## 2015-07-13 DIAGNOSIS — R609 Edema, unspecified: Secondary | ICD-10-CM | POA: Insufficient documentation

## 2015-07-13 DIAGNOSIS — M7989 Other specified soft tissue disorders: Secondary | ICD-10-CM

## 2015-07-13 DIAGNOSIS — M79661 Pain in right lower leg: Secondary | ICD-10-CM

## 2015-07-22 ENCOUNTER — Ambulatory Visit: Payer: Self-pay | Admitting: Nurse Practitioner

## 2015-07-24 ENCOUNTER — Ambulatory Visit
Admission: RE | Admit: 2015-07-24 | Discharge: 2015-07-24 | Disposition: A | Discharge status: Home / Self Care | Payer: Medicare Other | Source: Ambulatory Visit | Attending: Preventative Medicine | Admitting: Preventative Medicine

## 2015-07-24 DIAGNOSIS — Z1231 Encounter for screening mammogram for malignant neoplasm of breast: Secondary | ICD-10-CM | POA: Insufficient documentation

## 2015-09-17 ENCOUNTER — Encounter: Payer: Medicare Other | Attending: Surgery | Admitting: Surgery

## 2015-09-17 DIAGNOSIS — L97322 Non-pressure chronic ulcer of left ankle with fat layer exposed: Secondary | ICD-10-CM | POA: Diagnosis not present

## 2015-09-17 DIAGNOSIS — R6250 Unspecified lack of expected normal physiological development in childhood: Secondary | ICD-10-CM | POA: Insufficient documentation

## 2015-09-17 DIAGNOSIS — T8131XA Disruption of external operation (surgical) wound, not elsewhere classified, initial encounter: Secondary | ICD-10-CM | POA: Insufficient documentation

## 2015-09-17 DIAGNOSIS — G40909 Epilepsy, unspecified, not intractable, without status epilepticus: Secondary | ICD-10-CM | POA: Insufficient documentation

## 2015-09-17 DIAGNOSIS — Z9889 Other specified postprocedural states: Secondary | ICD-10-CM | POA: Insufficient documentation

## 2015-09-17 DIAGNOSIS — F819 Developmental disorder of scholastic skills, unspecified: Secondary | ICD-10-CM | POA: Insufficient documentation

## 2015-09-17 DIAGNOSIS — F329 Major depressive disorder, single episode, unspecified: Secondary | ICD-10-CM | POA: Insufficient documentation

## 2015-09-18 NOTE — Progress Notes (Signed)
ANERES, MUHL ([id number]) Visit Report for 09/17/2015 Abuse/Suicide Risk Screen Details Patient Name: Roberta Hampton, Roberta Hampton 09/17/2015 9:30 Date of Service: AM Medical Record [id number] Number: Patient Account Number: [id number] 1965/10/31 (50 y.o. Treating RN: Ahmed Prima Date of Birth/Sex: Female) Other Clinician: [facility], Treating Primary Care Physician: Ricky Stabs Physician/Extender: Norville Haggard, Referring Physician: Rivka Hampton in Treatment: 0 Abuse/Suicide Risk Screen Items Answer ABUSE/SUICIDE RISK SCREEN: Has anyone close to you tried to hurt or harm you recentlyo No Do you feel uncomfortable with anyone in your familyo No Has anyone forced you do things that you didnot want to doo No Do you have any thoughts of harming yourselfo No Patient displays signs or symptoms of abuse and/or neglect. No Electronic Signature(s) Signed: 09/17/2015 5:10:55 PM By: Alric Quan Entered By: Alric Quan on 09/17/2015 09:52:02 LEMMA, TRAINER ([id number]) -------------------------------------------------------------------------------- Activities of Daily Living Details Patient Name: [name], [name] 09/17/2015 9:30 Date of Service: AM Medical Record [id number] Number: Patient Account Number: [id number] 1966-05-23 (50 y.o. Treating RN: Ahmed Prima Date of Birth/Sex: Female) Other Clinician: [facility], Treating Primary Care Physician: Ricky Stabs Physician/Extender: Norville Haggard, Referring Physician: Rivka Hampton in Treatment: 0 Activities of Daily Living Items Answer Activities of Daily Living (Please select one for each item) Drive Automobile Not Able Take Medications Not Able Use Telephone Need Assistance Care for Appearance Need Assistance Use Toilet Completely Able Bath / Shower Need Assistance Dress Self Need Assistance Feed Self Completely Able Walk Completely Able Get In / Out Bed Completely Able Housework  Not Able Prepare Meals Not Able Handle Money Not Able Shop for Self Not Able Electronic Signature(s) Signed: 09/17/2015 5:10:55 PM By: Alric Quan Entered By: Alric Quan on 09/17/2015 09:52:55 [name] ([id number]) -------------------------------------------------------------------------------- Education Assessment Details Patient Name: Hampton, Roberta 09/17/2015 9:30 Date of Service: AM Medical Record [id number] Number: Patient Account Number: [id number] 24-Dec-1965 (50 y.o. Treating RN: Ahmed Prima Date of Birth/Sex: Female) Other Clinician: [facility], Treating Primary Care Physician: Ricky Stabs Physician/Extender: Norville Haggard, Referring Physician: Rivka Hampton in Treatment: 0 Primary Learner Assessed: Caregiver Reason Patient is not Primary Learner: mental retardation Learning Preferences/Education Level/Primary Language Learning Preference: Explanation, Printed Material Cognitive Barrier Assessment/Beliefs Language Barrier: No Translator Needed: No Memory Deficit: No Emotional Barrier: No Physical Barrier Assessment Impaired Vision: No Impaired Hearing: No Decreased Hand dexterity: No Knowledge/Comprehension Assessment Knowledge Level: High Comprehension Level: High Ability to understand written High instructions: Ability to understand verbal High instructions: Motivation Assessment Anxiety Level: Calm Cooperation: Cooperative Education Importance: Acknowledges Need Interest in Health Problems: Asks Questions Perception: Coherent Willingness to Engage in Self- High Management Activities: Readiness to Engage in Self- High Management Activities: TASMINE, GIESBRECHT ([id number]) Electronic Signature(s) Signed: 09/17/2015 5:10:55 PM By: Alric Quan Entered By: Alric Quan on 09/17/2015 09:54:16 EMMALEE, KOPEC  ([id number]) -------------------------------------------------------------------------------- Fall Risk Assessment Details Patient Name: Hampton, Roberta 09/17/2015 9:30 Date of Service: AM Medical Record [id number] Number: Patient Account Number: [id number] 06/16/1966 (50 y.o. Treating RN: Carolyne Fiscal, Debi Date of Birth/Sex: Female) Other Clinician: [facility], Treating Primary Care Physician: Ricky Stabs Physician/Extender: Norville Haggard, Referring Physician: Rivka Hampton in Treatment: 0 Fall Risk Assessment Items Have you had 2 or more falls in the last 12 monthso 0 Yes Have you had any fall that resulted in injury in the last 12 monthso 0 Yes FALL RISK ASSESSMENT: History of falling - immediate or within 3 months 0 No Secondary diagnosis 15 Yes Ambulatory aid None/bed rest/wheelchair/nurse 0 No Crutches/cane/walker 0 No Furniture 0 No IV Access/Saline Lock 0  No Gait/Training Normal/bed rest/immobile 0 No Weak 10 Yes Impaired 20 Yes Mental Status Oriented to own ability 0 No Electronic Signature(s) Signed: 09/17/2015 5:10:55 PM By: Alric Quan Entered By: Alric Quan on 09/17/2015 09:55:44 [place], Jadeyn Alfonso Patten ([id number]) -------------------------------------------------------------------------------- Foot Assessment Details Patient Name: Hampton, Roberta 09/17/2015 9:30 Date of Service: AM Medical Record [id number] Number: Patient Account Number: [id number] 12/03/1965 (50 y.o. Treating RN: Ahmed Prima Date of Birth/Sex: Female) Other Clinician: [facility], Treating Primary Care Physician: Ricky Stabs Physician/Extender: Norville Haggard, Referring Physician: Rivka Hampton in Treatment: 0 Foot Assessment Items [x]  Unable to perform due to altered mental status Site Locations + = Sensation present, - = Sensation absent, C = Callus, U = Ulcer R = Redness, W = Warmth, M = Maceration, PU = Pre-ulcerative lesion F = Fissure, S  = Swelling, D = Dryness Assessment Right: Left: Other Deformity: No No Prior Foot Ulcer: No No Prior Amputation: No No Charcot Joint: No No Ambulatory Status: Gait: Electronic Signature(s) Signed: 09/17/2015 5:10:55 PM By: Jodene Nam, Lessie Dings ([id number]) Entered By: Alric Quan on 09/17/2015 09:58:57 [place], Lessie Dings ([id number]) -------------------------------------------------------------------------------- Nutrition Risk Assessment Details Patient Name: Hampton, Roberta 09/17/2015 9:30 Date of Service: AM Medical Record [id number] Number: Patient Account Number: [id number] [birth year] (50 y.o. Treating RN: Ahmed Prima Date of Birth/Sex: Female) Other Clinician: [facility], Treating Primary Care Physician: Ricky Stabs Physician/Extender: Norville Haggard, Referring Physician: Rivka Hampton in Treatment: 0 Height (in): Weight (lbs): Body Mass Index (BMI): Nutrition Risk Assessment Items NUTRITION RISK SCREEN: I have an illness or condition that made me change the kind and/or 2 Yes amount of food I eat I eat fewer than two meals per day 0 No I eat few fruits and vegetables, or milk products 0 No I have three or more drinks of beer, liquor or wine almost every day 0 No I have tooth or mouth problems that make it hard for me to eat 0 No I don't always have enough money to buy the food I need 0 No I eat alone most of the time 0 No I take three or more different prescribed or over-the-counter drugs a 0 No day Without wanting to, I have lost or gained 10 pounds in the last six 0 No months I am not always physically able to shop, [employer] and/or feed myself 2 Yes Nutrition Protocols Good Risk Protocol Moderate Risk Protocol Electronic Signature(s) Signed: 09/17/2015 5:10:55 PM By: Alric Quan Entered By: Alric Quan on 09/17/2015 09:56:08

## 2015-09-18 NOTE — Progress Notes (Addendum)
[name], [name] ([id number]) Visit Report for 09/17/2015 Allergy List Details Patient Name: Roberta Hampton, Roberta Hampton. Date of Service: 09/17/2015 9:30 AM Medical Record Number: [id number] Patient Account Number: [id number] Date of Birth/Sex: 03-21-1966 (50 y.o. Female) Treating RN: Carolyne Fiscal, Debi Primary Care Physician: [name] Other Clinician: Referring Physician: [name] Treating Physician/Extender: Frann Rider in Treatment: 0 Allergies Active Allergies No Known Allergies Allergy Notes Electronic Signature(s) Signed: 11/12/2015 12:07:55 PM By: Montey Hora Previous Signature: 09/17/2015 5:10:55 PM Version By: Alric Quan Entered By: Montey Hora on 11/12/2015 12:07:55 Roberta Hampton ([id number]) -------------------------------------------------------------------------------- Arrival Information Details Patient Name: Roberta, Hampton. Date of Service: 09/17/2015 9:30 AM Medical Record Number: [id number] Patient Account Number: [id number] Date of Birth/Sex: [birth year] (51 y.o. Female) Treating RN: Carolyne Fiscal, Debi Primary Care Physician: [name] Other Clinician: Referring Physician: [name] Treating Physician/Extender: Frann Rider in Treatment: 0 Visit Information Patient Arrived: Ambulatory Arrival Time: 09:39 Accompanied By: caregiver Transfer Assistance: None Patient Identification Verified: Yes Secondary Verification Process Yes Completed: Patient Requires Transmission-Based No Precautions: Patient Has Alerts: No Electronic Signature(s) Signed: 09/17/2015 5:10:55 PM By: Alric Quan Entered By: Alric Quan on 09/17/2015 09:41:02 [facility], Lessie Dings ([id number]) -------------------------------------------------------------------------------- Clinic Level of Care Assessment Details Patient Name: Roberta Hampton. Date of Service: 09/17/2015 9:30 AM Medical Record Number:  [id number] Patient Account Number: [id number] Date of Birth/Sex: 1965/09/22 (50 y.o. Female) Treating RN: Afful, RN, BSN, Velva Harman Primary Care Physician: [name] Other Clinician: Referring Physician: [name] Treating Physician/Extender: Frann Rider in Treatment: 0 Clinic Level of Care Assessment Items TOOL 1 Quantity Score []  - Use when EandM and Procedure is performed on INITIAL visit 0 ASSESSMENTS - Nursing Assessment / Reassessment X - General Physical Exam (combine w/ comprehensive assessment (listed just 1 20 below) when performed on new pt. evals) X - Comprehensive Assessment (HX, ROS, Risk Assessments, Wounds Hx, etc.) 1 25 ASSESSMENTS - Wound and Skin Assessment / Reassessment []  - Dermatologic / Skin Assessment (not related to wound area) 0 ASSESSMENTS - Ostomy and/or Continence Assessment and Care []  - Incontinence Assessment and Management 0 []  - Ostomy Care Assessment and Management (repouching, etc.) 0 PROCESS - Coordination of Care X - Simple Patient / Family Education for ongoing care 1 15 []  - Complex (extensive) Patient / Family Education for ongoing care 0 X - Staff obtains Consents, Records, Test Results / Process Orders 1 10 []  - Staff telephones HHA, Nursing Homes / Clarify orders / etc 0 []  - Routine Transfer to another Facility (non-emergent condition) 0 []  - Routine Hospital Admission (non-emergent condition) 0 X - New Admissions / Biomedical engineer / Ordering NPWT, Apligraf, etc. 1 15 []  - Emergency Hospital Admission (emergent condition) 0 PROCESS - Special Needs []  - Pediatric / Minor Patient Management 0 []  - Isolation Patient Management 0 LINDA, VENTEICHER. ([id number]) []  - Hearing / Language / Visual special needs 0 []  - Assessment of Community assistance (transportation, D/C planning, etc.) 0 []  - Additional assistance / Altered mentation 0 []  - Support Surface(s) Assessment (bed, cushion, seat, etc.)  0 INTERVENTIONS - Miscellaneous []  - External ear exam 0 []  - Patient Transfer (multiple staff / Civil Service fast streamer / Similar devices) 0 []  - Simple Staple / Suture removal (25 or less) 0 []  - Complex Staple / Suture removal (26 or more) 0 []  - Hypo/Hyperglycemic Management (do not check if billed separately) 0 X - Ankle / Brachial Index (ABI) - do not check if billed separately 1 15 Has the patient been  seen at the hospital within the last three years: Yes Total Score: 100 Level Of Care: New/Established - Level 3 Electronic Signature(s) Signed: 09/17/2015 5:20:37 PM By: Regan Lemming BSN, RN Entered By: Regan Lemming on 09/17/2015 10:27:44 Roberta Hampton ([id number]) -------------------------------------------------------------------------------- Encounter Discharge Information Details Patient Name: Hampton, [name]. Date of Service: 09/17/2015 9:30 AM Medical Record Number: [id number] Patient Account Number: [id number] Date of Birth/Sex: Apr 17, 1966 (50 y.o. Female) Treating RN: Carolyne Fiscal, Debi Primary Care Physician: [name] Other Clinician: Referring Physician: [name] Treating Physician/Extender: Frann Rider in Treatment: 0 Encounter Discharge Information Items Discharge Pain Level: 0 Discharge Condition: Stable Ambulatory Status: Ambulatory Discharge Destination: Nursing Home Transportation: Other Accompanied By: caregiver Schedule Follow-up Appointment: Yes Medication Reconciliation completed and provided to Patient/Care Yes Labron Bloodgood: Provided on Clinical Summary of Care: 09/17/2015 Form Type Recipient Paper Patient CC Electronic Signature(s) Signed: 09/17/2015 10:41:13 AM By: Ruthine Dose Entered By: Ruthine Dose on 09/17/2015 10:41:12 [place], Lessie Dings ([id number]) -------------------------------------------------------------------------------- Lower Extremity Assessment Details Patient Name: Roberta Hampton. Date of Service:  09/17/2015 9:30 AM Medical Record Number: [id number] Patient Account Number: [id number] Date of Birth/Sex: 01-23-1966 (50 y.o. Female) Treating RN: Carolyne Fiscal, Debi Primary Care Physician: [name] Other Clinician: Referring Physician: [name] Treating Physician/Extender: Frann Rider in Treatment: 0 Edema Assessment Assessed: [Left: No] [Right: No] Edema: [Left: Ye] [Right: s] Calf Left: Right: Point of Measurement: 28 cm From Medial Instep 34 cm cm Ankle Left: Right: Point of Measurement: 11 cm From Medial Instep 22 cm cm Vascular Assessment Pulses: Posterior Tibial Dorsalis Pedis Palpable: [Left:Yes] Extremity colors, hair growth, and conditions: Extremity Color: [Left:Hyperpigmented] Hair Growth on Extremity: [Left:Yes] Temperature of Extremity: [Left:Warm] Capillary Refill: [Left:< 3 seconds] Blood Pressure: Brachial: [Left:90] Dorsalis Pedis: 100 [Left:Dorsalis Pedis:] Ankle: Posterior Tibial: [Left:Posterior Tibial: 1.11] Toe Nail Assessment Left: Right: Thick: No Discolored: No Deformed: No Improper Length and Hygiene: Yes Electronic Signature(s) Signed: 09/17/2015 5:10:55 PM By: Jodene Nam, Lessie Dings ([id number]) Entered By: Alric Quan on 09/17/2015 10:05:06 Roberta Hampton ([id number]) -------------------------------------------------------------------------------- Multi Wound Chart Details Patient Name: Roberta Hampton. Date of Service: 09/17/2015 9:30 AM Medical Record Number: [id number] Patient Account Number: [id number] Date of Birth/Sex: [birth year] (50 y.o. Female) Treating RN: Baruch Gouty, RN, BSN, Velva Harman Primary Care Physician: [name] Other Clinician: Referring Physician: [name] Treating Physician/Extender: Frann Rider in Treatment: 0 Vital Signs Height(in): Pulse(bpm): 85 Weight(lbs): Blood Pressure 105/66 (mmHg): Body Mass  Index(BMI): Temperature(F): Respiratory Rate 18 (breaths/min): Photos: [1:No Photos] [N/A:N/A] Wound Location: [1:Left Lower Leg - Lateral] [N/A:N/A] Wounding Event: [1:Surgical Injury] [N/A:N/A] Primary Etiology: [1:Trauma, Other] [N/A:N/A] Comorbid History: [1:Seizure Disorder] [N/A:N/A] Date Acquired: [1:05/07/2015] [N/A:N/A] Weeks of Treatment: [1:0] [N/A:N/A] Wound Status: [1:Open] [N/A:N/A] Measurements L x W x D 2.9x1.6x0.1 [N/A:N/A] (cm) Area (cm) : [1:3.644] [N/A:N/A] Volume (cm) : [1:0.364] [N/A:N/A] Classification: [1:Partial Thickness] [N/A:N/A] Exudate Amount: [1:Large] [N/A:N/A] Exudate Type: [1:Serosanguineous] [N/A:N/A] Exudate Color: [1:red, brown] [N/A:N/A] Wound Margin: [1:Flat and Intact] [N/A:N/A] Granulation Amount: [1:Large (67-100%)] [N/A:N/A] Granulation Quality: [1:Red, Pink] [N/A:N/A] Necrotic Amount: [1:Small (1-33%)] [N/A:N/A] Exposed Structures: [1:Fascia: No Fat: No Tendon: No Muscle: No Joint: No Bone: No Limited to Skin Breakdown] [N/A:N/A] Epithelialization: [1:None] [N/A:N/A] Periwound Skin Texture: Edema: Yes [N/A:N/A] Periwound Skin Moist: Yes N/A N/A Moisture: Periwound Skin Color: No Abnormalities Noted N/A N/A Temperature: No Abnormality N/A N/A Tenderness on Yes N/A N/A Palpation: Wound Preparation: Ulcer Cleansing: N/A N/A Rinsed/Irrigated with Saline Topical Anesthetic Applied: Other: lidocaine 4% Treatment Notes Electronic Signature(s) Signed: 09/17/2015 5:20:37 PM By: Regan Lemming BSN,  RN Entered By: Regan Lemming on 09/17/2015 10:21:18 Roberta Hampton ([id number]) -------------------------------------------------------------------------------- [place] Details Patient Name: ZIONAH, VERTUCCI. Date of Service: 09/17/2015 9:30 AM Medical Record Number: [id number] Patient Account Number: [id number] Date of Birth/Sex: 05-Sep-1966 (50 y.o. Female) Treating RN: Afful, RN, BSN, Velva Harman Primary Care Physician:  [name] Other Clinician: Referring Physician: [name] Treating Physician/Extender: Frann Rider in Treatment: 0 Active Inactive Abuse / Safety / Falls / Self Care Management Nursing Diagnoses: Impaired home maintenance Impaired physical mobility Knowledge deficit related to: safety; personal, health (wound), emergency Potential for falls Self care deficit: actual or potential Goals: Patient will remain injury free Date Initiated: 09/17/2015 Goal Status: Active Patient/caregiver will verbalize understanding of skin care regimen Date Initiated: 09/17/2015 Goal Status: Active Patient/caregiver will verbalize/demonstrate measures taken to improve the patient's personal safety Date Initiated: 09/17/2015 Goal Status: Active Patient/caregiver will verbalize/demonstrate measures taken to prevent injury and/or falls Date Initiated: 09/17/2015 Goal Status: Active Patient/caregiver will verbalize/demonstrate understanding of what to do in case of emergency Date Initiated: 09/17/2015 Goal Status: Active Interventions: Assess fall risk on admission and as needed Assess: immobility, friction, shearing, incontinence upon admission and as needed Assess impairment of mobility on admission and as needed per policy Assess self care needs on admission and as needed Provide education on basic hygiene Provide education on fall prevention Provide education on personal and home safety Provide education on safe transfers SIVI, GOODBAR ([id number]) Notes: Orientation to the Wound Care Program Nursing Diagnoses: Knowledge deficit related to the wound healing center program Goals: Patient/caregiver will verbalize understanding of the [place] Program Date Initiated: 09/17/2015 Goal Status: Active Interventions: Provide education on orientation to the wound center Notes: Wound/Skin Impairment Nursing Diagnoses: Impaired tissue integrity Knowledge  deficit related to ulceration/compromised skin integrity Goals: Patient/caregiver will verbalize understanding of skin care regimen Date Initiated: 09/17/2015 Goal Status: Active Ulcer/skin breakdown will have a volume reduction of 30% by week 4 Date Initiated: 09/17/2015 Goal Status: Active Ulcer/skin breakdown will have a volume reduction of 50% by week 8 Date Initiated: 09/17/2015 Goal Status: Active Ulcer/skin breakdown will have a volume reduction of 80% by week 12 Date Initiated: 09/17/2015 Goal Status: Active Ulcer/skin breakdown will heal within 14 weeks Date Initiated: 09/17/2015 Goal Status: Active Interventions: Assess patient/caregiver ability to obtain necessary supplies Assess patient/caregiver ability to perform ulcer/skin care regimen upon admission and as needed Assess ulceration(s) every visit Provide education on ulcer and skin care NYIESHA, EMMITT ([id number]) Notes: Electronic Signature(s) Signed: 09/17/2015 5:20:37 PM By: Regan Lemming BSN, RN Entered By: Regan Lemming on 09/17/2015 10:20:31 Roberta Hampton ([id number]) -------------------------------------------------------------------------------- Pain Assessment Details Patient Name: Roberta Hampton. Date of Service: 09/17/2015 9:30 AM Medical Record Number: [id number] Patient Account Number: [id number] Date of Birth/Sex: 02-Oct-1965 (50 y.o. Female) Treating RN: Carolyne Fiscal, Debi Primary Care Physician: [name] Other Clinician: Referring Physician: [name] Treating Physician/Extender: Frann Rider in Treatment: 0 Active Problems Location of Pain Severity and Description of Pain Patient Has Paino No Site Locations Pain Management and Medication Current Pain Management: Electronic Signature(s) Signed: 09/17/2015 5:10:55 PM By: Alric Quan Entered By: Alric Quan on 09/17/2015 09:41:16 [place], Lessie Dings  ([id number]) -------------------------------------------------------------------------------- Patient/Caregiver Education Details Patient Name: Roberta Hampton. Date of Service: 09/17/2015 9:30 AM Medical Record Number: [id number] Patient Account Number: [id number] Date of Birth/Gender: 11/25/1965 (50 y.o. Female) Treating RN: Carolyne Fiscal, Debi Primary Care Physician: [name] Other Clinician: Referring Physician: [name] Treating Physician/Extender: Frann Rider in Treatment: 0 Education  Assessment Education Provided To: Caregiver Education Topics Provided Wound/Skin Impairment: Handouts: Other: change dressing as directed Methods: Demonstration, Explain/Verbal Responses: State content correctly Electronic Signature(s) Signed: 09/17/2015 5:10:55 PM By: Alric Quan Entered By: Alric Quan on 09/17/2015 10:36:09 Roberta Hampton ([id number]) -------------------------------------------------------------------------------- Wound Assessment Details Patient Name: Roberta Hampton. Date of Service: 09/17/2015 9:30 AM Medical Record Number: [id number] Patient Account Number: [id number] Date of Birth/Sex: [birth year] (50 y.o. Female) Treating RN: Carolyne Fiscal, Debi Primary Care Physician: [name] Other Clinician: Referring Physician: [name] Treating Physician/Extender: Frann Rider in Treatment: 0 Wound Status Wound Number: 1 Primary Etiology: Trauma, Other Wound Location: Left Lower Leg - Lateral Wound Status: Open Wounding Event: Surgical Injury Comorbid History: Seizure Disorder Date Acquired: 05/07/2015 Weeks Of Treatment: 0 Clustered Wound: No Photos Photo Uploaded By: Alric Quan on 09/17/2015 16:14:03 Wound Measurements Length: (cm) 2.9 % Reduction in Width: (cm) 1.6 % Reduction in Depth: (cm) 0.1 Epithelializati Area: (cm) 3.644 Tunneling: Volume: (cm)  0.364 Undermining: Area: Volume: on: None No No Wound Description Classification: Partial Thickness Wound Margin: Flat and Intact Exudate Amount: Large Exudate Type: Serosanguineous Exudate Color: red, brown Foul Odor After Cleansing: No Wound Bed Granulation Amount: Large (67-100%) Exposed Structure Granulation Quality: Red, Pink Fascia Exposed: No Necrotic Amount: Small (1-33%) Fat Layer Exposed: No Necrotic Quality: Adherent Slough Tendon Exposed: No KRISANDRA, BRIZUELA R. ([id number]) Muscle Exposed: No Joint Exposed: No Bone Exposed: No Limited to Skin Breakdown Periwound Skin Texture Texture Color No Abnormalities Noted: No No Abnormalities Noted: No Localized Edema: Yes Temperature / Pain Moisture Temperature: No Abnormality No Abnormalities Noted: No Tenderness on Palpation: Yes Moist: Yes Wound Preparation Ulcer Cleansing: Rinsed/Irrigated with Saline Topical Anesthetic Applied: Other: lidocaine 4%, Electronic Signature(s) Signed: 09/17/2015 5:10:55 PM By: Alric Quan Entered By: Alric Quan on 09/17/2015 10:09:41 Roberta Hampton ([id number]) -------------------------------------------------------------------------------- Vitals Details Patient Name: Roberta Hampton. Date of Service: 09/17/2015 9:30 AM Medical Record Number: [id number] Patient Account Number: [id number] Date of Birth/Sex: Oct 09, 1965 (50 y.o. Female) Treating RN: Carolyne Fiscal, Debi Primary Care Physician: [name] Other Clinician: Referring Physician: [name] Treating Physician/Extender: Frann Rider in Treatment: 0 Vital Signs Time Taken: 09:41 Pulse (bpm): 85 Respiratory Rate (breaths/min): 18 Blood Pressure (mmHg): 105/66 Reference Range: 80 - 120 mg / dl Electronic Signature(s) Signed: 09/17/2015 5:10:55 PM By: Alric Quan Entered By: Alric Quan on 09/17/2015 09:43:21

## 2015-09-18 NOTE — Progress Notes (Signed)
Roberta Hampton ([id number]) Visit Report for 09/17/2015 Chief Complaint Document Details Patient Name: Roberta Hampton, Roberta Hampton 09/17/2015 9:30 Date of Service: AM Medical Record [id number] Number: Patient Account Number: [id number] Nov 08, 1965 (50 y.o. Treating RN: Ahmed Prima Date of Birth/Sex: Female) Other Clinician: [facility], Treating Primary Care Physician: Ricky Stabs Physician/Extender: Norville Haggard, Referring Physician: Rivka Safer in Treatment: 0 Information Obtained from: Caregiver Chief Complaint Patient presents to the wound care center with open non-healing surgical wound which she's had for about 2 months Electronic Signature(s) Signed: 09/17/2015 11:53:40 AM By: Christin Fudge MD, FACS Entered By: Christin Fudge on 09/17/2015 11:53:39 [name] ([id number]) -------------------------------------------------------------------------------- Debridement Details Patient Name: Roberta Hampton 09/17/2015 9:30 Date of Service: AM Medical Record [id number] Number: Patient Account Number: [id number] 1966/02/16 (49 y.o. Treating RN: Ahmed Prima Date of Birth/Sex: Female) Other Clinician: [facility], Treating Primary Care Physician: Ricky Stabs Physician/Extender: Norville Haggard, Referring Physician: Rivka Safer in Treatment: 0 Debridement Performed for Wound #1 Left,Lateral Lower Leg Assessment: Performed By: Physician Christin Fudge, MD Debridement: Debridement Pre-procedure Yes Verification/Time Out Taken: Start Time: 10:20 Pain Control: Lidocaine 4% Topical Solution Level: Skin/Subcutaneous Tissue Total Area Debrided (L x 2.9 (cm) x 1.6 (cm) = 4.64 (cm) W): Tissue and other Non-Viable, Exudate, Fibrin/Slough, Subcutaneous material debrided: Instrument: Other : gauze and saline Bleeding: Minimum Hemostasis Achieved: Silver Nitrate End Time: 10:25 Procedural Pain: 0 Post Procedural Pain: 0 Response to  Treatment: Procedure was tolerated well Post Debridement Measurements of Total Wound Length: (cm) 2.9 Width: (cm) 1.6 Depth: (cm) 0.1 Volume: (cm) 0.364 Post Procedure Diagnosis Same as Pre-procedure Electronic Signature(s) Signed: 09/17/2015 11:53:19 AM By: Christin Fudge MD, FACS Signed: 09/17/2015 5:10:55 PM By: Alric Quan Entered By: Christin Fudge on 09/17/2015 11:53:18 NAAVAH, MESENBRINK ([id number]) GABBIE, MUNDT ([id number]) -------------------------------------------------------------------------------- HPI Details Patient Name: Roberta Hampton 09/17/2015 9:30 Date of Service: AM Medical Record [id number] Number: Patient Account Number: [id number] [birth year] (50 y.o. Treating RN: Ahmed Prima Date of Birth/Sex: Female) Other Clinician: [facility], Treating Primary Care Physician: Ricky Stabs Physician/Extender: Norville Haggard, Referring Physician: Rivka Safer in Treatment: 0 History of Present Illness Location: left ankle area Quality: Patient reports experiencing a dull pain to affected area(s). Severity: Patient states wound are getting worse. Duration: Patient has had the wound for > 3 months prior to seeking treatment at the wound center Timing: Pain in wound is Intermittent (comes and goes Context: The wound occurred when the patient had a ORIF of the left ankle in September 2016 Modifying Factors: Other treatment(s) tried include: local care as prescribed by the orthopedic surgery group Associated Signs and Symptoms: Patient reports having increase swelling. HPI Description: 50 year old patient who has development delays and has subnormal mental acuity, comes along with her caregivers with a nonhealing wound to the left lateral ankle which has been there for about 2-3 months. She had a ORIF of the left ankle in September 2016 and during follow-up visits was noted to have drainage and cellulitis from the wound which has been  nonhealing. X-rays done in the orthopedic department have been normal as per the patient's caregivers but no reports are available. Past medical history significant for development delays, major depressive disorder, seizure disorder, status post hysterectomy, status post open reduction and internal fixation of bimalleolar left ankle fracture dislocation in September 2016. Electronic Signature(s) Signed: 09/17/2015 11:57:01 AM By: Christin Fudge MD, FACS Entered By: Christin Fudge on 09/17/2015 11:57:01 ZOEANN, STREAT ([id number]) -------------------------------------------------------------------------------- Physical Exam Details Patient Name: Roberta Hampton 09/17/2015 9:30 Date of Service:  AM Medical Record [id number] Number: Patient Account Number: [id number] [birth year] (50 y.o. Treating RN: Ahmed Prima Date of Birth/Sex: Female) Other Clinician: [facility], Treating Primary Care Physician: Ricky Stabs Physician/Extender: Norville Haggard, Referring Physician: Rivka Safer in Treatment: 0 Constitutional . Pulse regular. Respirations normal and unlabored. Afebrile. . Eyes Nonicteric. Reactive to light. Ears, Nose, Mouth, and Throat Lips, teeth, and gums WNL.Marland Kitchen Moist mucosa without lesions. Neck supple and nontender. No palpable supraclavicular or cervical adenopathy. Normal sized without goiter. Respiratory WNL. No retractions.. Cardiovascular Pedal Pulses WNL. ABI left lower extremity is 1.11. No clubbing, cyanosis or edema. Gastrointestinal (GI) Abdomen without masses or tenderness.. No liver or spleen enlargement or tenderness.. Lymphatic No adneopathy. No adenopathy. No adenopathy. Musculoskeletal Adexa without tenderness or enlargement.. Digits and nails w/o clubbing, cyanosis, infection, petechiae, ischemia, or inflammatory conditions.. Integumentary (Hair, Skin) No suspicious lesions. No crepitus or fluctuance. No peri-wound warmth or erythema.  No masses.Marland Kitchen Psychiatric Judgement and insight Intact.. No evidence of depression, anxiety, or agitation.. Notes she has an open wound on the left lateral ankle region which has some subcutaneous debris and areas of hyper granulation tissue. Rest of the surrounding skin shows no cellulitis. Electronic Signature(s) Signed: 09/17/2015 11:57:40 AM By: Christin Fudge MD, FACS Entered By: Christin Fudge on 09/17/2015 11:57:39 SHALETTE, DEEKEN ([id number]) KEELIE, LINDSETH ([id number]) -------------------------------------------------------------------------------- Physician Orders Details Patient Name: LUELLA, BOUTILIER. Date of Service: 09/17/2015 9:30 AM Medical Record Patient Account Number: [id number] Number: Afful, RN, BSN, Treating RN: 1966/06/29 (50 y.o. Velva Harman Date of Birth/Sex: Female) Other Clinician: [facility], Treating Primary Care Physician: Ricky Stabs Physician/Extender: Norville Haggard, Referring Physician: Rivka Safer in Treatment: 0 Verbal / Phone Orders: Yes Clinician: Afful, RN, BSN, Rita Read Back and Verified: Yes Diagnosis Coding Wound Cleansing Wound #1 Left,Lateral Lower Leg o Cleanse wound with mild soap and water o May Shower, gently pat wound dry prior to applying new dressing. Anesthetic Wound #1 Left,Lateral Lower Leg o Topical Lidocaine 4% cream applied to wound bed prior to debridement Primary Wound Dressing Wound #1 Left,Lateral Lower Leg o Hydrafera Blue Secondary Dressing Wound #1 Left,Lateral Lower Leg o Gauze and Kerlix/Conform Dressing Change Frequency Wound #1 Left,Lateral Lower Leg o Change dressing every week Follow-up Appointments Wound #1 Left,Lateral Lower Leg o Return Appointment in 1 week. Electronic Signature(s) Signed: 09/17/2015 4:38:48 PM By: Christin Fudge MD, FACS Signed: 09/17/2015 5:20:37 PM By: Regan Lemming BSN, RN Entered By: Regan Lemming on 09/17/2015 10:25:04 MARYCAROL, HAUSE  ([id number], KOVACEVICH ([id number]) -------------------------------------------------------------------------------- Problem List Details Patient Name: ZERENITY, DURY 09/17/2015 9:30 Date of Service: AM Medical Record [id number] Number: Patient Account Number: [id number] Apr 14, 1966 (50 y.o. 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/17/2015 Yes exposed Inactive Problems Resolved Problems Electronic Signature(s) Signed: 09/17/2015 11:54:34 AM By: Christin Fudge MD, FACS Previous Signature: 09/17/2015 11:54:29 AM Version By: Christin Fudge MD, FACS Previous Signature: 09/17/2015 11:53:11 AM Version By: Christin Fudge MD, FACS Entered By: Christin Fudge on 09/17/2015 11:54:34 DESSIREE, PORTALATIN ([id number]) -------------------------------------------------------------------------------- Progress Note Details Patient Name: JONETTA, DEBOCK 09/17/2015 9:30 Date of Service: AM Medical Record [id number] Number: Patient Account Number: [id number] [birth year] (50 y.o.  Treating RN: Ahmed Prima Date of Birth/Sex: Female) Other Clinician: [facility], Treating Primary Care Physician: Ricky Stabs Physician/Extender: Norville Haggard, Referring Physician: Rivka Safer in Treatment: 0 Subjective Chief Complaint Information obtained from Caregiver Patient presents to the wound care center with open non-healing surgical wound which she's had for about 2  months History of Present Illness (HPI) The following HPI elements were documented for the patient's wound: Location: left ankle area Quality: Patient reports experiencing a dull pain to affected area(s). Severity: Patient states wound are getting worse. Duration: Patient has had the wound for > 3 months prior to seeking treatment at the wound center Timing: Pain in wound is Intermittent (comes and goes Context: The wound occurred when the patient had a ORIF of the left ankle in September 2016 Modifying Factors: Other treatment(s) tried include: local care as prescribed by the orthopedic surgery group Associated Signs and Symptoms: Patient reports having increase swelling. 50 year old patient who has development delays and has subnormal mental acuity, comes along with her caregivers with a nonhealing wound to the left lateral ankle which has been there for about 2-3 months. She had a ORIF of the left ankle in September 2016 and during follow-up visits was noted to have drainage and cellulitis from the wound which has been nonhealing. X-rays done in the orthopedic department have been normal as per the patient's caregivers but no reports are available. Past medical history significant for development delays, major depressive disorder, seizure disorder, status post hysterectomy, status post open reduction and internal fixation of bimalleolar left ankle fracture dislocation in September 2016. Wound History Patient presents with 1 open wound that has been present for approximately sept 2016. Patient has been treating wound in the following manner: wet to dry. The wound has been healed in the past but has re- opened. Laboratory tests have not been performed in the last month. Patient reportedly has tested positive for an antibiotic resistant organism. Patient reportedly has not tested positive for osteomyelitis. Patient reportedly has not had testing performed to evaluate circulation in the  legs. Patient experiences the DEYLA, TEBBE ([id number]) following problems associated with their wounds: infection, swelling. Patient History Allergies No allergies have been documented for the patient Social History Never smoker, Marital Status - Single, Alcohol Use - Never, Drug Use - No History, Caffeine Use - Moderate. Medical History Neurologic Patient has history of Seizure Disorder - hx none recent Medical And Surgical History Notes Genitourinary hx UTIs Neurologic mental retardation Psychiatric bi-polar, schizophrenia, depression, ADHD, mental retardation, explosive disorder Review of Systems (ROS) Constitutional Symptoms (General Health) The patient has no complaints or symptoms. Eyes The patient has no complaints or symptoms. Ear/Nose/Mouth/Throat The patient has no complaints or symptoms. Hematologic/Lymphatic The patient has no complaints or symptoms. Respiratory Complains or has symptoms of Chronic or frequent coughs - recently. Cardiovascular The patient has no complaints or symptoms. Gastrointestinal The patient has no complaints or symptoms. Endocrine The patient has no complaints or symptoms. Genitourinary Complains or has symptoms of Incontinence/dribbling. Immunological The patient has no complaints or symptoms. Integumentary (Skin) Complains or has symptoms of Wounds, Swelling - feet. Musculoskeletal The patient has no complaints or symptoms. Oncologic The patient has no complaints or symptoms. Psychiatric AMABEL, MCCAFFERY ([id number]) Complains or has symptoms of Anxiety. General Notes: Pt is unable to answer family hx d/t to metal illness. Objective Constitutional Pulse regular. Respirations normal and unlabored. Afebrile. Vitals Time Taken: 9:41 AM, Pulse: 85 bpm, Respiratory Rate: 18 breaths/min, Blood Pressure: 105/66 mmHg. Eyes Nonicteric.  Reactive to light. Ears, Nose, Mouth, and Throat Lips, teeth, and gums WNL.Marland Kitchen Moist mucosa  without lesions. Neck supple and nontender. No palpable supraclavicular or cervical adenopathy. Normal sized without goiter. Respiratory WNL. No retractions.. Cardiovascular Pedal Pulses WNL. ABI left lower extremity is 1.11. No clubbing, cyanosis or edema. Gastrointestinal (GI) Abdomen without masses or tenderness.. No liver or spleen enlargement or tenderness.. Lymphatic No adneopathy. No adenopathy. No adenopathy. Musculoskeletal Adexa without tenderness or enlargement.. Digits and nails w/o clubbing, cyanosis, infection, petechiae, ischemia, or inflammatory conditions.Marland Kitchen Psychiatric Judgement and insight Intact.. No evidence of depression, anxiety, or agitation.. General Notes: she has an open wound on the left lateral ankle region which has some subcutaneous debris and areas of hyper granulation tissue. Rest of the surrounding skin shows no cellulitis. Integumentary (Hair, Skin) Gladson, Zohar R. ([id number]) No suspicious lesions. No crepitus or fluctuance. No peri-wound warmth or erythema. No masses.. Wound #1 status is Open. Original cause of wound was Surgical Injury. The wound is located on the Left,Lateral Lower Leg. The wound measures 2.9cm length x 1.6cm width x 0.1cm depth; 3.644cm^2 area and 0.364cm^3 volume. The wound is limited to skin breakdown. There is no tunneling or undermining noted. There is a large amount of serosanguineous drainage noted. The wound margin is flat and intact. There is large (67-100%) red, pink granulation within the wound bed. There is a small (1-33%) amount of necrotic tissue within the wound bed including Adherent Slough. The periwound skin appearance exhibited: Localized Edema, Moist. Periwound temperature was noted as No Abnormality. The periwound has tenderness on palpation. 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 She does not have diabetes or any chronic medical condition which could cause this to happen. Her peripheral arterial system seems to be normal. I have recommended RTD local dressings to be changed on alternate days and half continued to assess the caregivers to make sure she has appropriate nutrition, vitamin supplements including vitamin C and zinc. She will come back for review on a regular basis. Procedures Wound #1 Wound #1 is a Trauma, Other located on the Left,Lateral Lower Leg . There was a Skin/Subcutaneous Tissue Debridement [id number]) debridement with total area of 4.64 sq cm performed by Christin Fudge, MD. with the following instrument(s): gauze and saline to remove Non-Viable tissue/material including Exudate, Fibrin/Slough, and Subcutaneous after achieving pain control using Lidocaine 4% Topical Solution. A time out was conducted prior to the start of the procedure. A Minimum amount of bleeding was controlled with Silver Nitrate. The procedure was tolerated well with a pain level of 0 throughout and a pain level of 0 following the procedure. Post Debridement Measurements: 2.9cm length x 1.6cm width x 0.1cm depth; 0.364cm^3 volume. YOMNA, HAVEMAN ([id number]) Post procedure Diagnosis Wound #1: Same as Pre-Procedure Plan Wound Cleansing: Wound #1 Left,Lateral Lower Leg: Cleanse wound with mild soap and water May Shower, gently pat wound dry prior to applying new dressing. Anesthetic: Wound #1 Left,Lateral Lower Leg: Topical Lidocaine 4% cream applied to wound bed prior to debridement Primary Wound Dressing: Wound #1 Left,Lateral Lower Leg: Hydrafera Blue Secondary Dressing: Wound #1 Left,Lateral Lower  Leg: Gauze and Kerlix/Conform Dressing Change Frequency: Wound #1 Left,Lateral Lower Leg: Change  dressing every week Follow-up Appointments: Wound #1 Left,Lateral Lower Leg: Return Appointment in 1 week. This pleasant 50 year old who has subnormal mental faculties is brought in by her caregivers for a nonhealing wound of the left lower extremity. She does not have diabetes or any chronic medical condition which could cause this to happen. Her peripheral arterial system seems to be normal. I have recommended RTD local dressings to be changed on alternate days and half continued to assess the caregivers to make sure she has appropriate nutrition, vitamin supplements including vitamin C and zinc. She will come back for review on a regular basis. Electronic Signature(s) Signed: 09/17/2015 11:59:02 AM By: Christin Fudge MD, FACS Entered By: Christin Fudge on 09/17/2015 11:59:01 AYALA, SANTELL ([id number]) JAIYDA, DUNCKEL ([id number]) -------------------------------------------------------------------------------- ROS/PFSH Details Patient Name: DANIQUE, DORCE 09/17/2015 9:30 Date of Service: AM Medical Record [id number] Number: Patient Account Number: [id number] [birth year] (50 y.o. Treating RN: Ahmed Prima Date of Birth/Sex: Female) Other Clinician: [facility], Treating Primary Care Physician: Ricky Stabs Physician/Extender: Norville Haggard, Referring Physician: Rivka Safer in Treatment: 0 Wound History Do you currently have one or more open woundso Yes How many open wounds do you currently haveo 1 Approximately how long have you had your woundso sept 2016 How have you been treating your wound(s) until nowo wet to dry Has your wound(s) ever healed and then re-openedo Yes Have you had any lab work done in the past montho No Have you tested positive for an antibiotic resistant organism (MRSA, VRE)o Yes Date: 05/20/2015 Have you tested positive for  osteomyelitis (bone infection)o No Have you had any tests for circulation on your legso No Have you had other problems associated with your woundso Infection, Swelling Respiratory Complaints and Symptoms: Positive for: Chronic or frequent coughs - recently Genitourinary Complaints and Symptoms: Positive for: Incontinence/dribbling Medical History: Past Medical History Notes: hx UTIs Integumentary (Skin) Complaints and Symptoms: Positive for: Wounds; Swelling - feet Psychiatric Complaints and Symptoms: Positive for: Anxiety Medical History: Past Medical History Notes: MARYCATHERINE, PICASO ([id number]) bi-polar, schizophrenia, depression, ADHD, mental retardation, explosive disorder Constitutional Symptoms (General Health) Complaints and Symptoms: No Complaints or Symptoms Eyes Complaints and Symptoms: No Complaints or Symptoms Ear/Nose/Mouth/Throat Complaints and Symptoms: No Complaints or Symptoms Hematologic/Lymphatic Complaints and Symptoms: No Complaints or Symptoms Cardiovascular Complaints and Symptoms: No Complaints or Symptoms Gastrointestinal Complaints and Symptoms: No Complaints or Symptoms Endocrine Complaints and Symptoms: No Complaints or Symptoms Immunological Complaints and Symptoms: No Complaints or Symptoms Musculoskeletal Complaints and Symptoms: No Complaints or Symptoms Neurologic Medical History: Positive for: Seizure Disorder - hx none recent Past Medical History Notes: LILANDRA, PALOMEQUE ([id number]) mental retardation Oncologic Complaints and Symptoms: No Complaints or Symptoms Family and Social History Never smoker; Marital Status - Single; Alcohol Use: Never; Drug Use: No History; Caffeine Use: Moderate; Financial Concerns: No; Food, Clothing or Shelter Needs: No; Support System Lacking: No; Transportation Concerns: No; Advanced Directives: No; Patient does not want information on Advanced Directives; Do not resuscitate: No; Living  Will: No; Medical Power of Attorney: No Physician Affirmation I have reviewed and agree with the above information. Notes Pt is unable to answer family hx d/t to metal illness. Electronic Signature(s) Signed: 09/17/2015 10:11:57 AM By: Christin Fudge MD, FACS Signed: 09/17/2015 5:10:55 PM By: Alric Quan Entered By: Christin Fudge on 09/17/2015 10:11:57 LAKETRA, TVEDT ([id number]) -------------------------------------------------------------------------------- SuperBill Details Patient Name: PABLITA, CANIPE. Date of Service: 09/17/2015 Medical Record Number: [id number] Patient Account Number: [id number] Date of Birth/Sex: 1966/07/25 (50 y.o. 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 Facility Procedures CPT4: Description Modifier Quantity Code [id number] - WOUND CARE VISIT-LEV 3 EST PT 1 CPT4: [id number] - DEB SUBQ TISSUE 20 SQ CM/< 1 ICD-10 Description Diagnosis T81.31XA Disruption of external operation (surgical) wound, not elsewhere classified, initial encounter 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, unspecified  L97.322 Non-pressure chronic ulcer of left ankle with fat layer exposed Physician Procedures CPT4: Description Modifier Quantity Code [id number] - WC PHYS LEVEL 4 - NEW PT 25 1 ICD-10 Description Diagnosis T81.31XA Disruption of external operation (surgical) wound, not elsewhere classified, initial encounter F81.9 Developmental disorder of  scholastic skills, unspecified L97.322 Non-pressure chronic  ulcer of left ankle with fat layer exposed CPT4: [id number] - WC PHYS SUBQ TISS 20 SQ CM 1 ICD-10 Description Diagnosis T81.31XA AELLA, SHACKELFORD ([id number]) Electronic Signature(s) Signed: 09/17/2015 11:59:19 AM By: Christin Fudge MD, FACS Entered By: Christin Fudge on 09/17/2015 11:59:19

## 2015-09-24 ENCOUNTER — Encounter: Payer: Medicare Other | Admitting: Surgery

## 2015-09-24 DIAGNOSIS — T8131XA Disruption of external operation (surgical) wound, not elsewhere classified, initial encounter: Secondary | ICD-10-CM | POA: Diagnosis not present

## 2015-09-25 NOTE — Progress Notes (Signed)
Roberta Hampton, SCHLOTFELDT ([id number]) Visit Report for 09/24/2015 Chief Complaint Document Details Patient Name: Roberta, Hampton 09/24/2015 10:45 Date of Service: AM Medical Record [id number] Number: Patient Account Number: [id number] 07-24-1966 (50 y.o. Treating RN: Macarthur Critchley Date of Birth/Sex: Female) Other Clinician: [facility], Treating Primary Care Physician: Ricky Stabs Physician/Extender: Norville Haggard, Referring Physician: Rivka Safer in Treatment: 1 Information Obtained from: Caregiver Chief Complaint Patient presents to the wound care center with open non-healing surgical wound which she's had for about 2 months Electronic Signature(s) Signed: 09/24/2015 10:38:18 AM By: Christin Fudge MD, FACS Entered By: Christin Fudge on 09/24/2015 10:38:18 ADDICYN, MOELLER ([id number]) -------------------------------------------------------------------------------- HPI Details Patient Name: Roberta Hampton, MESSENGER 09/24/2015 10:45 Date of Service: AM Medical Record [id number] Number: Patient Account Number: [id number] Feb 02, 1966 (50 y.o. Treating RN: Macarthur Critchley Date of Birth/Sex: Female) Other Clinician: [facility], Treating Primary Care Physician: Ricky Stabs Physician/Extender: Norville Haggard, Referring Physician: Rivka Safer in Treatment: 1 History of Present Illness Location: left ankle area Quality: Patient reports experiencing a dull pain to affected area(s). Severity: Patient states wound are getting worse. Duration: Patient has had the wound for > 3 months prior to seeking treatment at the wound center Timing: Pain in wound is Intermittent (comes and goes Context: The wound occurred when the patient had a ORIF of the left ankle in September 2016 Modifying Factors: Other treatment(s) tried include: local care as prescribed by the orthopedic surgery group Associated Signs and Symptoms: Patient reports having increase swelling. HPI  Description: 50 year old patient who has development delays and has subnormal mental acuity, comes along with her caregivers with a nonhealing wound to the left lateral ankle which has been there for about 2-3 months. She had a ORIF of the left ankle in September 2016 and during follow-up visits was noted to have drainage and cellulitis from the wound which has been nonhealing. X-rays done in the orthopedic department have been normal as per the patient's caregivers but no reports are available. Past medical history significant for development delays, major depressive disorder, seizure disorder, status post hysterectomy, status post open reduction and internal fixation of bimalleolar left ankle fracture dislocation in September 2016. Electronic Signature(s) Signed: 09/24/2015 10:38:24 AM By: Christin Fudge MD, FACS Entered By: Christin Fudge on 09/24/2015 10:38:23 TANAIYAH, CHRISTIE ([id number]) -------------------------------------------------------------------------------- Otelia Sergeant TISS Details Patient Name: Roberta Hampton, Roberta Hampton 09/24/2015 10:45 Date of Service: AM Medical Record [id number] Number: Patient Account Number: [id number] 09-20-1965 (50 y.o. Treating RN: Macarthur Critchley Date of Birth/Sex: Female) Other Clinician: [facility], Treating Primary Care Physician: Ricky Stabs Physician/Extender: Norville Haggard, Referring Physician: Rivka Safer in Treatment: 1 Procedure Performed for: Wound #1 Left,Lateral Lower Leg Performed By: Physician Christin Fudge, MD Post Procedure Diagnosis Same as Pre-procedure Notes the patient had significant hyper granulation tissue and after cleaning it with saline was briskly bleeding and silver nitrate cauterization has been done. Electronic Signature(s) Signed: 09/24/2015 10:38:12 AM By: Christin Fudge MD, FACS Entered By: Christin Fudge on 09/24/2015 10:38:12 SOYNA, FRIER  ([id number]) -------------------------------------------------------------------------------- Physical Exam Details Patient Name: Roberta Hampton, Roberta Hampton 09/24/2015 10:45 Date of Service: AM Medical Record [id number] Number: Patient Account Number: [id number] 1966/06/21 (50 y.o. Treating RN: Macarthur Critchley Date of Birth/Sex: Female) Other Clinician: [facility], Treating Primary Care Physician: Ricky Stabs Physician/Extender: Norville Haggard, Referring Physician: Rivka Safer in Treatment: 1 Constitutional . Pulse regular. Respirations normal and unlabored. Afebrile. . Eyes Nonicteric. Reactive to light. Ears, Nose, Mouth, and Throat Lips, teeth, and gums WNL.Marland Kitchen Moist mucosa without lesions. Neck supple and nontender. No palpable  supraclavicular or cervical adenopathy. Normal sized without goiter. Respiratory WNL. No retractions.. Cardiovascular Pedal Pulses WNL. No clubbing, cyanosis or edema. Lymphatic No adneopathy. No adenopathy. No adenopathy. Musculoskeletal Adexa without tenderness or enlargement.. Digits and nails w/o clubbing, cyanosis, infection, petechiae, ischemia, or inflammatory conditions.. Integumentary (Hair, Skin) No suspicious lesions. No crepitus or fluctuance. No peri-wound warmth or erythema. No masses.Marland Kitchen Psychiatric Judgement and insight Intact.. No evidence of depression, anxiety, or agitation.. Notes the wound has some hyper granulation tissue which was cauterized with silver nitrate and an appropriate dressing will be applied. Electronic Signature(s) Signed: 09/24/2015 10:39:01 AM By: Christin Fudge MD, FACS Entered By: Christin Fudge on 09/24/2015 10:39:01 VESTIE, SULLINS ([id number]) -------------------------------------------------------------------------------- Physician Orders Details Patient Name: Hampton, Roberta 09/24/2015 10:45 Date of Service: AM Medical Record [id number] Number: Patient Account Number: [id number] 1966-07-01  (50 y.o. Afful, RN, BSN, Date of Birth/Sex: Treating RN: Female) Randall Hiss, Other Clinician: Primary Care Physician: Jefm Bryant Treating Jetson Pickrel, Jarome Matin, Physician/Extender: Referring Physician: Rivka Safer in Treatment: 1 Verbal / Phone Orders: Yes Clinician: Afful, RN, BSN, Rita Read Back and Verified: Yes Diagnosis Coding Wound Cleansing Wound #1 Left,Lateral Lower Leg o Cleanse wound with mild soap and water o May Shower, gently pat wound dry prior to applying new dressing. Anesthetic Wound #1 Left,Lateral Lower Leg o Topical Lidocaine 4% cream applied to wound bed prior to debridement Skin Barriers/Peri-Wound Care Wound #1 Left,Lateral Lower Leg o Barrier cream - zinc oxide paste around wound. Not on wound. Primary Wound Dressing Wound #1 Left,Lateral Lower Leg o Hydrafera Blue - RTD dressing when at home. Secondary Dressing Wound #1 Left,Lateral Lower Leg o Gauze and Kerlix/Conform Dressing Change Frequency Wound #1 Left,Lateral Lower Leg o Change dressing every week Follow-up Appointments Wound #1 Left,Lateral Lower Leg o Return Appointment in 1 week. Electronic Signature(s) AZHARIA, MIKOLAJCZAK ([id number]) Signed: 09/24/2015 4:22:03 PM By: Christin Fudge MD, FACS Signed: 09/24/2015 4:27:43 PM By: Regan Lemming BSN, RN Entered By: Regan Lemming on 09/24/2015 10:37:34 DALYNN, STEUERWALD ([id number]) -------------------------------------------------------------------------------- Problem List Details Patient Name: Roberta Hampton, Roberta Hampton 09/24/2015 10:45 Date of Service: AM Medical Record [id number] Number: Patient Account Number: [id number] Jun 16, 1966 (50 y.o. 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 Treating RN: Macarthur Critchley Date of Birth/Sex: Female) Other Clinician: [facility], Treating Primary Care Physician: Ricky Stabs Physician/Extender: Norville Haggard, Referring Physician: Rivka Safer in Treatment: 1 Subjective Chief Complaint Information obtained from Caregiver Patient presents to the wound care center with open non-healing surgical wound which she's had for about 2 months History of Present Illness (HPI) The following HPI elements were documented for the patient's wound: Location: left ankle area Quality: Patient reports experiencing a dull pain to affected area(s). Severity: Patient states wound are getting worse. Duration: Patient has had the wound for > 3 months prior to seeking treatment at  the wound center Timing: Pain in wound is Intermittent (comes and goes Context: The wound occurred when the patient had a ORIF of the left ankle in September 2016 Modifying Factors: Other treatment(s) tried include: local care as prescribed by the orthopedic surgery group Associated Signs and Symptoms: Patient reports having increase swelling. 50 year old patient who has development delays and  has subnormal mental acuity, comes along with her caregivers with a nonhealing wound to the left lateral ankle which has been there for about 2-3 months. She had a ORIF of the left ankle in September 2016 and during follow-up visits was noted to have drainage and cellulitis from the wound which has been nonhealing. X-rays done in the orthopedic department have been normal as per the patient's caregivers but no reports are available. Past medical history significant for development delays, major depressive disorder, seizure disorder, status post hysterectomy, status post open reduction and internal fixation of bimalleolar left ankle fracture dislocation in September 2016. Objective ANSHU, STAAL R. ([id number]) Constitutional Pulse regular. Respirations normal and unlabored. Afebrile. Vitals Time Taken: 10:21 AM, Temperature: 97.6 F, Pulse: 77 bpm, Blood Pressure: 117/75 mmHg. Eyes Nonicteric. Reactive to light. Ears, Nose, Mouth, and Throat Lips, teeth, and gums WNL.Marland Kitchen Moist mucosa without lesions. Neck supple and nontender. No palpable supraclavicular or cervical adenopathy. Normal sized without goiter. Respiratory WNL. No retractions.. Cardiovascular Pedal Pulses WNL. No clubbing, cyanosis or edema. Lymphatic No adneopathy. No adenopathy. No adenopathy. Musculoskeletal Adexa without tenderness or enlargement.. Digits and nails w/o clubbing, cyanosis, infection, petechiae, ischemia, or inflammatory conditions.Marland Kitchen Psychiatric Judgement and insight Intact.. No evidence of depression, anxiety, or agitation.. General Notes: the wound has some hyper granulation tissue which was cauterized with silver nitrate and an appropriate dressing will be applied. Integumentary (Hair, Skin) No suspicious lesions. No crepitus or fluctuance. No peri-wound warmth or erythema. No masses.. Wound #1 status is Open. Original cause of wound was Surgical Injury. The wound is located on the Left,Lateral  Lower Leg. The wound measures 1.7cm length x 1.1cm width x 0.1cm depth; 1.469cm^2 area and 0.147cm^3 volume. The wound is limited to skin breakdown. There is no tunneling or undermining noted. There is a small amount of serosanguineous drainage noted. The wound margin is flat and intact. There is large (67-100%) red, pink granulation within the wound bed. There is a small (1-33%) amount of necrotic tissue within the wound bed including Adherent Slough. The periwound skin appearance did not exhibit: Localized Edema, Moist. Periwound temperature was noted as No Abnormality. The periwound has tenderness on palpation. 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 An CHEM CAUT GRANULATION TISS procedure was performed by Christin Fudge, MD. Post procedure Diagnosis Wound #1: Same as Pre-Procedure Notes: the patient had significant hyper granulation tissue and after cleaning it with saline was briskly bleeding and silver nitrate cauterization has been done. Plan Wound Cleansing: Wound #1 Left,Lateral Lower Leg: Cleanse wound with mild soap and water May Shower, gently pat wound dry prior to applying new dressing. Anesthetic: Wound #1 Left,Lateral Lower Leg: Topical Lidocaine 4% cream applied to wound bed prior to debridement Skin Barriers/Peri-Wound Care: Wound #1 Left,Lateral Lower Leg: Barrier cream - zinc oxide paste around wound. Not on wound. Primary Wound Dressing:  Wound #1 Left,Lateral Lower Leg: Hydrafera Blue - RTD dressing when at home. Secondary Dressing: Wound #1 Left,Lateral Lower Leg: Gauze and Kerlix/Conform Dressing Change  Frequency: Wound #1 Left,Lateral Lower Leg: Change dressing every week Follow-up Appointments: MAEVERY, WASMUND ([id number]) Wound #1 Left,Lateral Lower Leg: Return Appointment in 1 week. I have recommended RTD local dressings to be changed on alternate days and half continued to assess the caregivers to make sure she has appropriate nutrition, vitamin supplements including vitamin C and zinc. She will come back for review on a regular basis. Electronic Signature(s) Signed: 09/24/2015 10:40:28 AM By: Christin Fudge MD, FACS Entered By: Christin Fudge on 09/24/2015 10:40:28 [name] ([id number]) -------------------------------------------------------------------------------- SuperBill Details Patient Name: ALEXANDRE, Roberta Hampton. Date of Service: 09/24/2015 Medical Record Number: [id number] Patient Account Number: [id number] Date of Birth/Sex: 1965/09/15 (50 y.o. 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 Facility Procedures CPT4: Description Modifier Quantity Code [id number] - CHEM CAUT GRANULATION TISS 1 ICD-10 Description Diagnosis T81.31XA Disruption of external operation (surgical) wound, not elsewhere classified, initial encounter F81.9 Developmental disorder of  scholastic skills, unspecified L97.322 Non-pressure chronic ulcer of left ankle with fat layer exposed Physician Procedures CPT4: Description Modifier Quantity Code [id number] - WC PHYS CHEM CAUT GRAN TISSUE 1 ICD-10 Description Diagnosis T81.31XA Disruption of  external operation (surgical) wound, not elsewhere classified, initial encounter F81.9 Developmental disorder of  scholastic skills, unspecified L97.322 Non-pressure chronic ulcer of left ankle with fat layer exposed Electronic Signature(s) Signed: 09/24/2015 10:40:48 AM By: Christin Fudge MD, FACS Entered By: Christin Fudge on 09/24/2015 10:40:48

## 2015-09-25 NOTE — Progress Notes (Signed)
MALIKAH, REGULA ([id number]) Visit Report for 09/24/2015 Arrival Information Details Patient Name: Roberta Hampton, Roberta Hampton. Date of Service: 09/24/2015 10:45 AM Medical Record Number: [id number] Patient Account Number: [id number] Date of Birth/Sex: 1966-08-08 (50 y.o. Female) Treating RN: Macarthur Critchley Primary Care Physician: [name] Other Clinician: Referring Physician: [name] Treating Physician/Extender: Frann Rider in Treatment: 1 Visit Information History Since Last Visit All ordered tests and consults were completed: No Patient Arrived: Ambulatory Added or deleted any medications: No Arrival Time: 10:18 Any new allergies or adverse reactions: No Accompanied By: caregivers Had a fall or experienced change in No Transfer Assistance: None activities of daily living that may affect Patient Identification Verified: Yes risk of falls: Secondary Verification Process Yes Signs or symptoms of abuse/neglect since last No Completed: visito Patient Requires Transmission-Based No Hospitalized since last visit: No Precautions: Has Dressing in Place as Prescribed: Yes Patient Has Alerts: No Pain Present Now: No Electronic Signature(s) Signed: 09/24/2015 1:58:09 PM By: Rebecca Eaton, RN, Sendra Entered By: Rebecca Eaton RN, Sendra on 09/24/2015 10:18:47 [name] ([id number]) -------------------------------------------------------------------------------- Encounter Discharge Information Details Patient Name: [name]. Date of Service: 09/24/2015 10:45 AM Medical Record Number: [id number] Patient Account Number: [id number] Date of Birth/Sex: [birth year] (50 y.o. Female) Treating RN: Baruch Gouty, RN, BSN, Velva Harman Primary Care Physician: [name] Other Clinician: Referring Physician: [name] Treating Physician/Extender: Frann Rider in Treatment: 1 Encounter Discharge Information Items Discharge Pain Level:  0 Discharge Condition: Stable Ambulatory Status: Ambulatory Discharge Destination: Home Transportation: Private Auto Accompanied By: caregiver Schedule Follow-up Appointment: Yes Medication Reconciliation completed and provided to Patient/Care Yes Skye Plamondon: Provided on Clinical Summary of Care: 09/24/2015 Form Type Recipient Paper Patient CC Electronic Signature(s) Signed: 09/24/2015 10:41:58 AM By: Ruthine Dose Entered By: Ruthine Dose on 09/24/2015 10:41:58 [name] ([id number]) -------------------------------------------------------------------------------- Lower Extremity Assessment Details Patient Name: [name]. Date of Service: 09/24/2015 10:45 AM Medical Record Number: [id number] Patient Account Number: [id number] Date of Birth/Sex: 07-Dec-1965 (50 y.o. Female) Treating RN: Macarthur Critchley Primary Care Physician: [name] Other Clinician: Referring Physician: [name] Treating Physician/Extender: Frann Rider in Treatment: 1 Edema Assessment Assessed: [Left: No] [Right: No] E[Left: dema] [Right: :] Calf Left: Right: Point of Measurement: 28 cm From Medial Instep 34 cm cm Ankle Left: Right: Point of Measurement: 11 cm From Medial Instep 23 cm cm Vascular Assessment Pulses: Posterior Tibial Dorsalis Pedis Palpable: [Left:Yes] Extremity colors, hair growth, and conditions: Extremity Color: [Left:Normal] Hair Growth on Extremity: [Left:Yes] Temperature of Extremity: [Left:Warm] Capillary Refill: [Left:< 3 seconds] Toe Nail Assessment Left: Right: Thick: No Discolored: No Deformed: No Improper Length and Hygiene: No Electronic Signature(s) Signed: 09/24/2015 1:58:09 PM By: Rebecca Eaton, RN, Sendra Entered By: Rebecca Eaton RN, Sendra on 09/24/2015 10:30:30 [name] ([id number]) -------------------------------------------------------------------------------- Multi Wound Chart Details Patient Name: [name]. Date of Service: 09/24/2015 10:45 AM Medical Record Number: [id number] Patient Account Number: [id number] Date of Birth/Sex: 1966/02/15 (50 y.o. Female) Treating RN: Baruch Gouty, RN, BSN, Velva Harman Primary Care Physician: [name] Other Clinician: Referring Physician: [name] Treating Physician/Extender: Frann Rider in Treatment: 1 Vital Signs Height(in): Pulse(bpm): 77 Weight(lbs): Blood Pressure 117/75 (mmHg): Body Mass Index(BMI): Temperature(F): 97.6 Respiratory Rate (breaths/min): Photos: [1:No Photos] [N/A:N/A] Wound Location: [1:Left Lower Leg - Lateral] [N/A:N/A] Wounding Event: [1:Surgical Injury] [N/A:N/A] Primary Etiology: [1:Trauma, Other] [N/A:N/A] Comorbid History: [1:Seizure Disorder] [N/A:N/A] Date Acquired: [1:05/07/2015] [N/A:N/A] Weeks of Treatment: [1:1] [N/A:N/A] Wound Status: [1:Open] [N/A:N/A] Measurements L x W x D 1.7x1.1x0.1 [N/A:N/A] (cm) Area (cm) : [1:1.469] [N/A:N/A]  Volume (cm) : [1:0.147] [N/A:N/A] % Reduction in Area: [1:59.70%] [N/A:N/A] % Reduction in Volume: 59.60% [N/A:N/A] Classification: [1:Partial Thickness] [N/A:N/A] Exudate Amount: [1:Small] [N/A:N/A] Exudate Type: [1:Serosanguineous] [N/A:N/A] Exudate Color: [1:red, brown] [N/A:N/A] Wound Margin: [1:Flat and Intact] [N/A:N/A] Granulation Amount: [1:Large (67-100%)] [N/A:N/A] Granulation Quality: [1:Red, Pink, Hyper- granulation] [N/A:N/A] Necrotic Amount: [1:Small (1-33%)] [N/A:N/A] Exposed Structures: [1:Fascia: No Fat: No Tendon: No Muscle: No Joint: No Bone: No] [N/A:N/A] Limited to Skin Breakdown Epithelialization: Small (1-33%) N/A N/A Periwound Skin Texture: Edema: No N/A N/A Periwound Skin Moist: No N/A N/A Moisture: Periwound Skin Color: No Abnormalities Noted N/A N/A Temperature: No Abnormality N/A N/A Tenderness on Yes N/A N/A Palpation: Wound Preparation: Ulcer Cleansing: N/A N/A Rinsed/Irrigated with Saline Topical  Anesthetic Applied: Other: lidocaine 4% Treatment Notes Electronic Signature(s) Signed: 09/24/2015 4:27:43 PM By: Regan Lemming BSN, RN Entered By: Regan Lemming on 09/24/2015 10:35:13 EULALAH, ATTEBERY ([id number]) -------------------------------------------------------------------------------- [place] Details Patient Name: SHERETTE, MOSCHELLA. Date of Service: 09/24/2015 10:45 AM Medical Record Number: [id number] Patient Account Number: [id number] Date of Birth/Sex: October 24, 1965 (50 y.o. Female) Treating RN: Afful, RN, BSN, Velva Harman Primary Care Physician: [name] Other Clinician: Referring Physician: [name] Treating Physician/Extender: Frann Rider in Treatment: 1 Active Inactive Abuse / Safety / Falls / Self Care Management Nursing Diagnoses: Impaired home maintenance Impaired physical mobility Knowledge deficit related to: safety; personal, health (wound), emergency Potential for falls Self care deficit: actual or potential Goals: Patient will remain injury free Date Initiated: 09/17/2015 Goal Status: Active Patient/caregiver will verbalize understanding of skin care regimen Date Initiated: 09/17/2015 Goal Status: Active Patient/caregiver will verbalize/demonstrate measures taken to improve the patient's personal safety Date Initiated: 09/17/2015 Goal Status: Active Patient/caregiver will verbalize/demonstrate measures taken to prevent injury and/or falls Date Initiated: 09/17/2015 Goal Status: Active Patient/caregiver will verbalize/demonstrate understanding of what to do in case of emergency Date Initiated: 09/17/2015 Goal Status: Active Interventions: Assess fall risk on admission and as needed Assess: immobility, friction, shearing, incontinence upon admission and as needed Assess impairment of mobility on admission and as needed per policy Assess self care needs on admission and as needed Provide education on basic  hygiene Provide education on fall prevention Provide education on personal and home safety Provide education on safe transfers SHELA, SAYARATH ([id number]) Notes: Orientation to the Wound Care Program Nursing Diagnoses: Knowledge deficit related to the wound healing center program Goals: Patient/caregiver will verbalize understanding of the [facility] Program Date Initiated: 09/17/2015 Goal Status: Active Interventions: Provide education on orientation to the wound center Notes: Wound/Skin Impairment Nursing Diagnoses: Impaired tissue integrity Knowledge deficit related to ulceration/compromised skin integrity Goals: Patient/caregiver will verbalize understanding of skin care regimen Date Initiated: 09/17/2015 Goal Status: Active Ulcer/skin breakdown will have a volume reduction of 30% by week 4 Date Initiated: 09/17/2015 Goal Status: Active Ulcer/skin breakdown will have a volume reduction of 50% by week 8 Date Initiated: 09/17/2015 Goal Status: Active Ulcer/skin breakdown will have a volume reduction of 80% by week 12 Date Initiated: 09/17/2015 Goal Status: Active Ulcer/skin breakdown will heal within 14 weeks Date Initiated: 09/17/2015 Goal Status: Active Interventions: Assess patient/caregiver ability to obtain necessary supplies Assess patient/caregiver ability to perform ulcer/skin care regimen upon admission and as needed Assess ulceration(s) every visit Provide education on ulcer and skin care HASANA, BUROKER ([id number]) Notes: Electronic Signature(s) Signed: 09/24/2015 4:27:43 PM By: Regan Lemming BSN, RN Entered By: Regan Lemming on 09/24/2015 10:34:36 [name] ([id number]) -------------------------------------------------------------------------------- Pain Assessment Details Patient Name: [name]. Date  of Service: 09/24/2015 10:45 AM Medical Record Number: [id number] Patient Account Number: [id number] Date of Birth/Sex: 1966-07-05  (50 y.o. Female) Treating RN: Macarthur Critchley Primary Care Physician: [name] Other Clinician: Referring Physician: [name] Treating Physician/Extender: Frann Rider in Treatment: 1 Active Problems Location of Pain Severity and Description of Pain Patient Has Paino No Site Locations Rate the pain. Current Pain Level: 0 Pain Management and Medication Current Pain Management: Electronic Signature(s) Signed: 09/24/2015 1:58:09 PM By: Rebecca Eaton, RN, Sendra Entered By: Rebecca Eaton RN, Sendra on 09/24/2015 10:18:54 [name] ([id number]) -------------------------------------------------------------------------------- Patient/Caregiver Education Details Patient Name: [name]. Date of Service: 09/24/2015 10:45 AM Medical Record Number: [id number] Patient Account Number: [id number] Date of Birth/Gender: [birth year] (50 y.o. Female) Treating RN: Baruch Gouty, RN, BSN, Velva Harman Primary Care Physician: [name] Other Clinician: Referring Physician: [name] Treating Physician/Extender: Frann Rider in Treatment: 1 Education Assessment Education Provided To: Patient and Caregiver Education Topics Provided Wound/Skin Impairment: Handouts: Caring for Your Ulcer, Skin Care Do's and Dont's Methods: Explain/Verbal Responses: State content correctly Electronic Signature(s) Signed: 09/24/2015 4:27:43 PM By: Regan Lemming BSN, RN Entered By: Regan Lemming on 09/24/2015 10:41:34 [name] ([id number]) -------------------------------------------------------------------------------- Wound Assessment Details Patient Name: BLAISE, KNAUS. Date of Service: 09/24/2015 10:45 AM Medical Record Number: [id number] Patient Account Number: [id number] Date of Birth/Sex: 05-23-1966 (50 y.o. Female) Treating RN: Macarthur Critchley Primary Care Physician: [name] Other Clinician: Referring Physician: [name] Treating Physician/Extender: Frann Rider in Treatment: 1 Wound Status Wound Number: 1 Primary Etiology: Trauma, Other Wound Location: Left Lower Leg - Lateral Wound Status: Open Wounding Event: Surgical Injury Comorbid History: Seizure Disorder Date Acquired: 05/07/2015 Weeks Of Treatment: 1 Clustered Wound: No Photos Photo Uploaded By: Regan Lemming on 09/24/2015 16:24:05 Wound Measurements Length: (cm) 1.7 Width: (cm) 1.1 Depth: (cm) 0.1 Area: (cm) 1.469 Volume: (cm) 0.147 % Reduction in Area: 59.7% % Reduction in Volume: 59.6% Epithelialization: Small (1-33%) Tunneling: No Undermining: No Wound Description Classification: Partial Thickness Wound Margin: Flat and Intact Exudate Amount: Small Exudate Type: Serosanguineous Exudate Color: red, brown Foul Odor After Cleansing: No Wound Bed Granulation Amount: Large (67-100%) Exposed Structure Granulation Quality: Red, Pink, Hyper-granulation Fascia Exposed: No Necrotic Amount: Small (1-33%) Fat Layer Exposed: No Necrotic Quality: Adherent Slough Tendon Exposed: No RAKEYA, ADEM R. ([id number]) Muscle Exposed: No Joint Exposed: No Bone Exposed: No Limited to Skin Breakdown Periwound Skin Texture Texture Color No Abnormalities Noted: No No Abnormalities Noted: No Localized Edema: No Temperature / Pain Moisture Temperature: No Abnormality No Abnormalities Noted: No Tenderness on Palpation: Yes Moist: No Wound Preparation Ulcer Cleansing: Rinsed/Irrigated with Saline Topical Anesthetic Applied: Other: lidocaine 4%, Treatment Notes Wound #1 (Left, Lateral Lower Leg) 1. Cleansed with: Clean wound with Normal Saline 2. Anesthetic Topical Lidocaine 4% cream to wound bed prior to debridement 3. Peri-wound Care: Barrier cream 4. Dressing Applied: Hydrafera Blue 5. Secondary Dressing Applied Gauze and Kerlix/Conform Electronic Signature(s) Signed: 09/24/2015 1:58:09 PM By: Rebecca Eaton, RN,  Sendra Entered By: Rebecca Eaton RN, Sendra on 09/24/2015 10:26:57 [name] ([id number]) -------------------------------------------------------------------------------- Vitals Details Patient Name: [name]. Date of Service: 09/24/2015 10:45 AM Medical Record Number: [id number] Patient Account Number: [id number] Date of Birth/Sex: [birth year] (50 y.o. Female) Treating RN: Macarthur Critchley Primary Care Physician: [name] Other Clinician: Referring Physician: [name] Treating Physician/Extender: Frann Rider in Treatment: 1 Vital Signs Time Taken: 10:21 Temperature (F): 97.6 Pulse (bpm): 77 Blood Pressure (mmHg): 117/75 Reference Range: 80 - 120 mg / dl Electronic Signature(s)  Signed: 09/24/2015 1:58:09 PM By: Rebecca Eaton RN, Romero Liner By: Rebecca Eaton RN, Sendra on 09/24/2015 10:21:37

## 2015-10-01 ENCOUNTER — Ambulatory Visit: Payer: Medicare Other | Admitting: Surgery

## 2015-10-05 ENCOUNTER — Encounter: Payer: Medicare Other | Admitting: Surgery

## 2015-10-05 DIAGNOSIS — T8131XA Disruption of external operation (surgical) wound, not elsewhere classified, initial encounter: Secondary | ICD-10-CM | POA: Diagnosis not present

## 2015-10-08 NOTE — Progress Notes (Signed)
Roberta Hampton, Roberta Hampton ([id number]) Visit Report for 10/05/2015 Chief Complaint Document Details Patient Name: Roberta Hampton, Roberta Hampton 10/05/2015 10:00 Date of Service: AM Medical Record [id number] Number: Patient Account Number: [id number] 1966/07/29 (50 y.o. Treating RN: Ahmed Prima Date of Birth/Sex: Female) Other Clinician: [facility], Treating Primary Care Physician: Ricky Stabs Physician/Extender: Norville Haggard, Referring Physician: Rivka Safer in Treatment: 2 Information Obtained from: Caregiver Chief Complaint Patient presents to the wound care center with open non-healing surgical wound which she's had for about 2 months Electronic Signature(s) Signed: 10/05/2015 10:38:15 AM By: Christin Fudge MD, FACS Entered By: Christin Fudge on 10/05/2015 10:38:15 Roberta Hampton, Roberta Hampton ([id number]) -------------------------------------------------------------------------------- HPI Details Patient Name: Roberta Hampton 10/05/2015 10:00 Date of Service: AM Medical Record [id number] Number: Patient Account Number: [id number] 1966/05/27 (50 y.o. Treating RN: Ahmed Prima Date of Birth/Sex: Female) Other Clinician: [facility], Treating Primary Care Physician: Ricky Stabs Physician/Extender: Norville Haggard, Referring Physician: Rivka Safer in Treatment: 2 History of Present Illness Location: left ankle area Quality: Patient reports experiencing a dull pain to affected area(s). Severity: Patient states wound are getting worse. Duration: Patient has had the wound for > 3 months prior to seeking treatment at the wound center Timing: Pain in wound is Intermittent (comes and goes Context: The wound occurred when the patient had a ORIF of the left ankle in September 2016 Modifying Factors: Other treatment(s) tried include: local care as prescribed by the orthopedic surgery group Associated Signs and Symptoms: Patient reports having increase swelling. HPI  Description: 50 year old patient who has development delays and has subnormal mental acuity, comes along with her caregivers with a nonhealing wound to the left lateral ankle which has been there for about 2-3 months. She had a ORIF of the left ankle in September 2016 and during follow-up visits was noted to have drainage and cellulitis from the wound which has been nonhealing. X-rays done in the orthopedic department have been normal as per the patient's caregivers but no reports are available. Past medical history significant for development delays, major depressive disorder, seizure disorder, status post hysterectomy, status post open reduction and internal fixation of bimalleolar left ankle fracture dislocation in September 2016. Electronic Signature(s) Signed: 10/05/2015 10:38:20 AM By: Christin Fudge MD, FACS Entered By: Christin Fudge on 10/05/2015 10:38:20 Roberta Hampton, Roberta Hampton ([id number]) -------------------------------------------------------------------------------- Physical Exam Details Patient Name: Roberta Hampton, Roberta Hampton 10/05/2015 10:00 Date of Service: AM Medical Record [id number] Number: Patient Account Number: [id number] 17-Jul-1966 (50 y.o. Treating RN: Ahmed Prima Date of Birth/Sex: Female) Other Clinician: [facility], Treating Primary Care Physician: Ricky Stabs Physician/Extender: Norville Haggard, Referring Physician: Rivka Safer in Treatment: 2 Constitutional . Pulse regular. Respirations normal and unlabored. Afebrile. . Eyes Nonicteric. Reactive to light. Ears, Nose, Mouth, and Throat Lips, teeth, and gums WNL.Marland Kitchen Moist mucosa without lesions. Neck supple and nontender. No palpable supraclavicular or cervical adenopathy. Normal sized without goiter. Respiratory WNL. No retractions.. Cardiovascular Pedal Pulses WNL. No clubbing, cyanosis or edema. Lymphatic No adneopathy. No adenopathy. No adenopathy. Musculoskeletal Adexa without tenderness or  enlargement.. Digits and nails w/o clubbing, cyanosis, infection, petechiae, ischemia, or inflammatory conditions.. Integumentary (Hair, Skin) No suspicious lesions. No crepitus or fluctuance. No peri-wound warmth or erythema. No masses.Marland Kitchen Psychiatric Judgement and insight Intact.. No evidence of depression, anxiety, or agitation.. Notes the wound has minimal amount of hypergranulation tissue which was cauterized with silver nitrate and the surrounding areas do not have cellulitis or any other issues. Electronic Signature(s) Signed: 10/05/2015 10:40:20 AM By: Christin Fudge MD, FACS Entered By: Christin Fudge on 10/05/2015 10:40:18 Roberta Hampton,  Roberta Hampton ([id number]) -------------------------------------------------------------------------------- Physician Orders Details Patient Name: Roberta Hampton, Roberta Hampton 10/05/2015 10:00 Date of Service: AM Medical Record [id number] Number: Patient Account Number: [id number] 20-Feb-1966 (50 y.o. Treating RN: Ahmed Prima Date of Birth/Sex: Female) Other Clinician: [facility], Treating Primary Care Physician: Ricky Stabs Physician/Extender: Norville Haggard, Referring Physician: Rivka Safer in Treatment: 2 Verbal / Phone Orders: Yes Clinician: [place], Debi Read Back and Verified: Yes Diagnosis Coding Wound Cleansing Wound #1 Left,Lateral Lower Leg o Cleanse wound with mild soap and water o May Shower, gently pat wound dry prior to applying new dressing. Anesthetic Wound #1 Left,Lateral Lower Leg o Topical Lidocaine 4% cream applied to wound bed prior to debridement Skin Barriers/Peri-Wound Care Wound #1 Left,Lateral Lower Leg o Barrier cream - zinc oxide paste around wound. Not on wound. Primary Wound Dressing Wound #1 Left,Lateral Lower Leg o Hydrafera Blue - RTD dressing when at home. Secondary Dressing Wound #1 Left,Lateral Lower Leg o Gauze and Kerlix/Conform Dressing Change Frequency Wound #1 Left,Lateral Lower  Leg o Change dressing every week Follow-up Appointments Wound #1 Left,Lateral Lower Leg o Return Appointment in 1 week. Home Health Wound #1 Left,Lateral Lower Leg Roberta Hampton, Roberta Hampton ([id number]) o [place] Nurse may visit PRN to address patientos wound care needs. o FACE TO FACE ENCOUNTER: MEDICARE and MEDICAID PATIENTS: I certify that this patient is under my care and that I had a face-to-face encounter that meets the physician face-to-face encounter requirements with this patient on this date. The encounter with the patient was in whole or in part for the following MEDICAL CONDITION: (primary reason for [place]) MEDICAL NECESSITY: I certify, that based on my findings, NURSING services are a medically necessary home health service. HOME BOUND STATUS: I certify that my clinical findings support that this patient is homebound (i.e., Due to illness or injury, pt requires aid of supportive devices such as crutches, cane, wheelchairs, walkers, the use of special transportation or the assistance of another person to leave their place of residence. There is a normal inability to leave the home and doing so requires considerable and taxing effort. Other absences are for medical reasons / religious services and are infrequent or of short duration when for other reasons). o If current dressing causes regression in wound condition, may D/C ordered dressing product/s and apply Normal Saline Moist Dressing daily until next [place] / Other MD appointment. [place] of regression in wound condition at [phone]. o Please direct any NON-WOUND related issues/requests for orders to patient's Primary Care Physician Electronic Signature(s) Signed: 10/05/2015 4:26:31 PM By: Christin Fudge MD, FACS Signed: 10/07/2015 5:03:09 PM By: Alric Quan Entered By: Alric Quan on 10/05/2015 10:25:32 Roberta Hampton, Roberta Hampton  ([id number]) -------------------------------------------------------------------------------- Problem List Details Patient Name: Roberta Hampton, Roberta Hampton 10/05/2015 10:00 Date of Service: AM Medical Record [id number] Number: Patient Account Number: [id number] November 09, 1965 (49 y.o. 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 10/05/2015 10:00 Date of Service: AM Medical Record [id number] Number: Patient Account Number: [id number] Jan 28, 1966 (50 y.o. Treating RN: Ahmed Prima Date of Birth/Sex: Female) Other Clinician: [facility], Treating Primary Care Physician: Ricky Stabs Physician/Extender: Norville Haggard, Referring Physician: Rivka Safer in Treatment: 2 Subjective Chief Complaint Information obtained from Caregiver Patient presents to the wound care center with open non-healing surgical wound which she's had for about 2 months History of Present Illness (HPI) The following HPI elements were documented for the patient's wound: Location: left ankle area Quality: Patient reports experiencing a dull pain  to affected area(s). Severity: Patient states wound are getting worse. Duration: Patient has had the wound for > 3 months prior to seeking treatment at the wound center Timing: Pain in wound is Intermittent (comes and goes Context: The wound occurred when the patient had a ORIF of the left ankle in September 2016 Modifying Factors: Other treatment(s) tried include: local care as prescribed by the orthopedic surgery group Associated Signs and Symptoms: Patient reports having increase swelling. 50 year old patient who has development delays and has subnormal mental acuity, comes along with her caregivers with a nonhealing wound to the left lateral ankle which has been there for about 2-3 months. She had a ORIF of the left ankle in September 2016 and during follow-up visits was noted to have drainage and cellulitis from the wound which has been nonhealing. X-rays done in the orthopedic department have been normal as per the patient's caregivers but no reports are available. Past medical history significant for development delays, major depressive disorder, seizure disorder, status post hysterectomy, status post open reduction and internal fixation of bimalleolar left ankle fracture dislocation in September 2016. Objective CLYDENE, KONDO R. ([id number]) Constitutional Pulse regular. Respirations normal and unlabored. Afebrile. Vitals Time Taken: 9:59 AM, Pulse: 89 bpm, Respiratory Rate: 16 breaths/min, Blood Pressure: 124/77 mmHg. Eyes Nonicteric. Reactive to light. Ears, Nose, Mouth, and Throat Lips, teeth, and gums WNL.Marland Kitchen Moist mucosa without lesions. Neck supple and nontender. No palpable supraclavicular or cervical adenopathy. Normal sized without goiter. Respiratory WNL. No retractions.. Cardiovascular Pedal Pulses WNL. No clubbing, cyanosis or edema. Lymphatic No adneopathy. No adenopathy. No adenopathy. Musculoskeletal Adexa without tenderness or enlargement.. Digits and nails  w/o clubbing, cyanosis, infection, petechiae, ischemia, or inflammatory conditions.Marland Kitchen Psychiatric Judgement and insight Intact.. No evidence of depression, anxiety, or agitation.. General Notes: the wound has minimal amount of hypergranulation tissue which was cauterized with silver nitrate and the surrounding areas do not have cellulitis or any other issues. Integumentary (Hair, Skin) No suspicious lesions. No crepitus or fluctuance. No peri-wound warmth or erythema. No masses.. Wound #1 status is Open. Original cause of wound was Surgical Injury. The wound is located on the Left,Lateral Lower Leg. The wound measures 1.3cm length x 0.7cm width x 0.1cm depth; 0.715cm^2 area and 0.071cm^3 volume. The wound is limited to skin breakdown. There is no tunneling or undermining noted. There is a medium amount of serosanguineous drainage noted. The wound margin is flat and intact. There is large (67-100%) red, pink granulation within the wound bed. There is no necrotic tissue within the wound bed. The periwound skin appearance did not exhibit: Localized Edema, Moist. Periwound temperature was noted as No Abnormality. The periwound has tenderness on palpation. 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 I do not believe a biopsy is needed and we will leave the decision about x-rays to the orthopedic surgeon. Would continue with RTD and local dressing to be changed every other day and see her back next week. Plan Wound Cleansing: Wound #1 Left,Lateral Lower Leg: Cleanse wound with mild soap and  water May Shower, gently pat wound dry prior to applying new dressing. Anesthetic: Wound #1 Left,Lateral Lower Leg: Topical Lidocaine 4% cream applied to wound bed prior to debridement Skin Barriers/Peri-Wound Care: Wound #1 Left,Lateral Lower Leg: Barrier cream - zinc oxide paste around wound. Not on wound. Primary Wound Dressing: Wound #1 Left,Lateral Lower Leg: Hydrafera Blue - RTD dressing when at home. Secondary Dressing: Wound #1 Left,Lateral Lower Leg: Gauze and Kerlix/Conform Dressing Change Frequency: Wound #1 Left,Lateral Lower Leg: Change dressing every week Follow-up Appointments: Wound #1 Left,Lateral Lower Leg: Return Appointment in 1 week. Home Health: Wound #1 Left,Lateral Lower Leg: [place] Nurse may visit PRN to address patient s wound care needs. FACE TO FACE ENCOUNTER: MEDICARE and MEDICAID PATIENTS: I certify that this patient is under LYNNE, VONCANNON. ([id number]) my care and that I had a face-to-face encounter that meets the physician face-to-face encounter requirements with this patient on this date. The encounter with the patient was in whole or in part for the following MEDICAL CONDITION: (primary reason for [place]) MEDICAL NECESSITY: I certify, that based on my findings, NURSING services are a medically necessary home health service. HOME BOUND STATUS: I certify that my clinical findings support that this patient is homebound (i.e., Due to illness or injury, pt requires aid of supportive devices such as crutches, cane, wheelchairs, walkers, the use of special transportation or the assistance of another person to leave their place of residence. There is a normal inability to leave the home and doing so requires considerable and taxing effort. Other absences are for medical reasons / religious services and are infrequent or of short duration when for other reasons). If current dressing causes regression in wound  condition, may D/C ordered dressing product/s and apply Normal Saline Moist Dressing daily until next [place] / Other MD appointment. [place] of regression in wound condition at [phone]. Please direct any NON-WOUND related issues/requests for orders to patient's Primary Care Physician the patient's caregiver said the home health nurse had some questions regarding x-rays and a biopsy to be done of the hyper granulation tissue. I do not believe a biopsy is needed and we will leave the decision about x-rays to the orthopedic surgeon. Would continue with RTD and local dressing to be changed every other day and see her back next week. Electronic Signature(s) Signed: 10/05/2015 10:41:25 AM By: Christin Fudge MD, FACS Entered By: Christin Fudge on 10/05/2015 10:41:25 ASJIA, HARPIN ([id number]) -------------------------------------------------------------------------------- SuperBill Details Patient Name: ASPASIA, FRIEDBERG. Date of Service: 10/05/2015 Medical Record Number: [id number] Patient Account Number: [id number] Date of Birth/Sex: 22-Jul-1966 (50 y.o. 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 Facility Procedures CPT4 Code: [id number] Description: [place]  VISIT-LEV 3 EST PT Modifier: Quantity: 1 Physician Procedures CPT4: Description Modifier Quantity Code [id number] - WC PHYS LEVEL 3 - EST PT 1 ICD-10 Description Diagnosis T81.31XA Disruption of external operation (surgical) wound, not  elsewhere classified, initial encounter F81.9 Developmental disorder of  scholastic skills, unspecified L97.322 Non-pressure chronic ulcer of left ankle with fat layer exposed Electronic Signature(s) Signed: 10/05/2015 4:26:31 PM By: Christin Fudge MD, FACS Signed: 10/07/2015 5:03:09 PM By: Alric Quan Previous Signature: 10/05/2015 10:41:38 AM Version By: Christin Fudge MD, FACS Entered By: Alric Quan on 10/05/2015 11:55:16

## 2015-10-08 NOTE — Progress Notes (Signed)
ALEISE, TOOLAN ([id number]) Visit Report for 10/05/2015 Arrival Information Details Patient Name: Roberta Hampton, Roberta Hampton. Date of Service: 10/05/2015 10:00 AM Medical Record Number: [id number] Patient Account Number: [id number] Date of Birth/Sex: 1966/03/01 (50 y.o. Female) Treating RN: Carolyne Fiscal, Debi Primary Care Physician: [name] Other Clinician: Referring Physician: [name] Treating Physician/Extender: Frann Rider in Treatment: 2 Visit Information History Since Last Visit All ordered tests and consults were completed: No Patient Arrived: Ambulatory Added or deleted any medications: No Arrival Time: 09:58 Any new allergies or adverse reactions: No Accompanied By: caregiver Had a fall or experienced change in No Transfer Assistance: None activities of daily living that may affect Patient Identification Verified: Yes risk of falls: Secondary Verification Process Yes Signs or symptoms of abuse/neglect since last No Completed: visito Patient Requires Transmission-Based No Hospitalized since last visit: No Precautions: Pain Present Now: No Patient Has Alerts: No Electronic Signature(s) Signed: 10/07/2015 5:03:09 PM By: Alric Quan Entered By: Alric Quan on 10/05/2015 09:59:04 Zurita, Roberta Hampton ([id number]) -------------------------------------------------------------------------------- Clinic Level of Care Assessment Details Patient Name: Roberta Hampton. Date of Service: 10/05/2015 10:00 AM Medical Record Number: [id number] Patient Account Number: [id number] Date of Birth/Sex: [birth year] (50 y.o. Female) Treating RN: Carolyne Fiscal, Debi Primary Care Physician: [name] Other Clinician: Referring Physician: [name] Treating Physician/Extender: Frann Rider in Treatment: 2 Clinic Level of Care Assessment Items TOOL 4 Quantity Score []  - Use when only an EandM is performed on FOLLOW-UP visit  0 ASSESSMENTS - Nursing Assessment / Reassessment []  - Reassessment of Co-morbidities (includes updates in patient status) 0 X - Reassessment of Adherence to Treatment Plan 1 5 ASSESSMENTS - Wound and Skin Assessment / Reassessment X - Simple Wound Assessment / Reassessment - one wound 1 5 []  - Complex Wound Assessment / Reassessment - multiple wounds 0 []  - Dermatologic / Skin Assessment (not related to wound area) 0 ASSESSMENTS - Focused Assessment []  - Circumferential Edema Measurements - multi extremities 0 []  - Nutritional Assessment / Counseling / Intervention 0 []  - Lower Extremity Assessment (monofilament, tuning fork, pulses) 0 []  - Peripheral Arterial Disease Assessment (using hand held doppler) 0 ASSESSMENTS - Ostomy and/or Continence Assessment and Care []  - Incontinence Assessment and Management 0 []  - Ostomy Care Assessment and Management (repouching, etc.) 0 PROCESS - Coordination of Care []  - Simple Patient / Family Education for ongoing care 0 X - Complex (extensive) Patient / Family Education for ongoing care 1 20 []  - Staff obtains Programmer, systems, Records, Test Results / Process Orders 0 X - Staff telephones HHA, Nursing Homes / Clarify orders / etc 1 10 []  - Routine Transfer to another Facility (non-emergent condition) 0 Roberta Hampton, Roberta R. ([id number]) []  - Routine Hospital Admission (non-emergent condition) 0 []  - New Admissions / Biomedical engineer / Ordering NPWT, Apligraf, etc. 0 []  - Emergency Hospital Admission (emergent condition) 0 X - Simple Discharge Coordination 1 10 []  - Complex (extensive) Discharge Coordination 0 PROCESS - Special Needs []  - Pediatric / Minor Patient Management 0 []  - Isolation Patient Management 0 X - Hearing / Language / Visual special needs 1 15 []  - Assessment of Community assistance (transportation, D/C planning, etc.) 0 []  - Additional assistance / Altered mentation 0 []  - Support Surface(s) Assessment (bed, cushion, seat,  etc.) 0 INTERVENTIONS - Wound Cleansing / Measurement X - Simple Wound Cleansing - one wound 1 5 []  - Complex Wound Cleansing - multiple wounds 0 X - Wound Imaging (photographs - any number of wounds) 1 5 []  -  Wound Tracing (instead of photographs) 0 X - Simple Wound Measurement - one wound 1 5 []  - Complex Wound Measurement - multiple wounds 0 INTERVENTIONS - Wound Dressings X - Small Wound Dressing one or multiple wounds 1 10 []  - Medium Wound Dressing one or multiple wounds 0 []  - Large Wound Dressing one or multiple wounds 0 X - Application of Medications - topical 1 5 []  - Application of Medications - injection 0 INTERVENTIONS - Miscellaneous []  - External ear exam 0 Roberta Hampton, Roberta R. ([id number]) []  - Specimen Collection (cultures, biopsies, blood, body fluids, etc.) 0 []  - Specimen(s) / Culture(s) sent or taken to Lab for analysis 0 []  - Patient Transfer (multiple staff / Harrel Lemon Lift / Similar devices) 0 []  - Simple Staple / Suture removal (25 or less) 0 []  - Complex Staple / Suture removal (26 or more) 0 []  - Hypo / Hyperglycemic Management (close monitor of Blood Glucose) 0 []  - Ankle / Brachial Index (ABI) - do not check if billed separately 0 X - Vital Signs 1 5 Has the patient been seen at the hospital within the last three years: Yes Total Score: 100 Level Of Care: New/Established - Level 3 Electronic Signature(s) Signed: 10/07/2015 5:03:09 PM By: Alric Quan Entered By: Alric Quan on 10/05/2015 11:54:43 Roberta Hampton, Roberta Hampton ([id number]) -------------------------------------------------------------------------------- Encounter Discharge Information Details Patient Name: Roberta Hampton. Date of Service: 10/05/2015 10:00 AM Medical Record Number: [id number] Patient Account Number: [id number] Date of Birth/Sex: Jul 24, 1966 (50 y.o. Female) Treating RN: Carolyne Fiscal, Debi Primary Care Physician: [name] Other Clinician: Referring Physician:  [name] Treating Physician/Extender: Frann Rider in Treatment: 2 Encounter Discharge Information Items Discharge Pain Level: 0 Discharge Condition: Stable Ambulatory Status: Ambulatory Discharge Destination: Home Transportation: Other Accompanied By: caregiver Schedule Follow-up Appointment: Yes Medication Reconciliation completed and provided to Patient/Care Yes Khayree Delellis: Provided on Clinical Summary of Care: 10/05/2015 Form Type Recipient Paper Patient CC Electronic Signature(s) Signed: 10/05/2015 10:34:13 AM By: Ruthine Dose Entered By: Ruthine Dose on 10/05/2015 10:34:13 Yaeger, Roberta Hampton ([id number]) -------------------------------------------------------------------------------- Lower Extremity Assessment Details Patient Name: Roberta Hampton. Date of Service: 10/05/2015 10:00 AM Medical Record Number: [id number] Patient Account Number: [id number] Date of Birth/Sex: 01/08/1966 (50 y.o. Female) Treating RN: Carolyne Fiscal, Debi Primary Care Physician: [name] Other Clinician: Referring Physician: [name] Treating Physician/Extender: Frann Rider in Treatment: 2 Edema Assessment Assessed: [Left: No] [Right: No] E[Left: dema] [Right: :] Calf Left: Right: Point of Measurement: cm From Medial Instep 33 cm cm Ankle Left: Right: Point of Measurement: cm From Medial Instep 20.8 cm cm Vascular Assessment Pulses: Posterior Tibial Dorsalis Pedis Palpable: [Left:Yes] Extremity colors, hair growth, and conditions: Extremity Color: [Left:Hyperpigmented] Temperature of Extremity: [Left:Warm] Capillary Refill: [Left:< 3 seconds] Toe Nail Assessment Left: Right: Thick: No Discolored: No Deformed: No Improper Length and Hygiene: No Electronic Signature(s) Signed: 10/07/2015 5:03:09 PM By: Alric Quan Entered By: Alric Quan on 10/05/2015 10:05:53 Krach, Roberta Hampton  ([id number]) -------------------------------------------------------------------------------- Multi Wound Chart Details Patient Name: Roberta Hampton. Date of Service: 10/05/2015 10:00 AM Medical Record Number: [id number] Patient Account Number: [id number] Date of Birth/Sex: Mar 20, 1966 (50 y.o. Female) Treating RN: Carolyne Fiscal, Debi Primary Care Physician: [name] Other Clinician: Referring Physician: [name] Treating Physician/Extender: Frann Rider in Treatment: 2 Vital Signs Height(in): Pulse(bpm): 89 Weight(lbs): Blood Pressure 124/77 (mmHg): Body Mass Index(BMI): Temperature(F): Respiratory Rate 16 (breaths/min): Photos: [1:No Photos] [N/A:N/A] Wound Location: [1:Left Lower Leg - Lateral] [N/A:N/A] Wounding Event: [1:Surgical Injury] [N/A:N/A] Primary Etiology: [1:Trauma, Other] [N/A:N/A] Comorbid History: [  1:Seizure Disorder] [N/A:N/A] Date Acquired: [1:05/07/2015] [N/A:N/A] Weeks of Treatment: [1:2] [N/A:N/A] Wound Status: [1:Open] [N/A:N/A] Measurements L x W x D 1.3x0.7x0.1 [N/A:N/A] (cm) Area (cm) : [1:0.715] [N/A:N/A] Volume (cm) : [1:0.071] [N/A:N/A] % Reduction in Area: [1:80.40%] [N/A:N/A] % Reduction in Volume: 80.50% [N/A:N/A] Classification: [1:Partial Thickness] [N/A:N/A] Exudate Amount: [1:Medium] [N/A:N/A] Exudate Type: [1:Serosanguineous] [N/A:N/A] Exudate Color: [1:red, brown] [N/A:N/A] Wound Margin: [1:Flat and Intact] [N/A:N/A] Granulation Amount: [1:Large (67-100%)] [N/A:N/A] Granulation Quality: [1:Red, Pink, Hyper- granulation] [N/A:N/A] Necrotic Amount: [1:Small (1-33%)] [N/A:N/A] Exposed Structures: [1:Fascia: No Fat: No Tendon: No Muscle: No Joint: No Bone: No] [N/A:N/A] Limited to Skin Breakdown Epithelialization: Small (1-33%) N/A N/A Periwound Skin Texture: Edema: No N/A N/A Periwound Skin Moist: No N/A N/A Moisture: Periwound Skin Color: No Abnormalities Noted N/A N/A Temperature: No  Abnormality N/A N/A Tenderness on Yes N/A N/A Palpation: Wound Preparation: Ulcer Cleansing: N/A N/A Rinsed/Irrigated with Saline Topical Anesthetic Applied: Other: lidocaine 4% Treatment Notes Electronic Signature(s) Signed: 10/07/2015 5:03:09 PM By: Alric Quan Entered By: Alric Quan on 10/05/2015 10:06:46 Roberta Hampton, Roberta Hampton ([id number]) -------------------------------------------------------------------------------- Multi-Disciplinary Care Plan Details Patient Name: Roberta Hampton, CRAVENER. Date of Service: 10/05/2015 10:00 AM Medical Record Number: [id number] Patient Account Number: [id number] Date of Birth/Sex: Mar 09, 1966 (50 y.o. Female) Treating RN: Carolyne Fiscal, Debi Primary Care Physician: [name] Other Clinician: Referring Physician: [name] Treating Physician/Extender: Frann Rider in Treatment: 2 Active Inactive Abuse / Safety / Falls / Self Care Management Nursing Diagnoses: Impaired home maintenance Impaired physical mobility Knowledge deficit related to: safety; personal, health (wound), emergency Potential for falls Self care deficit: actual or potential Goals: Patient will remain injury free Date Initiated: 09/17/2015 Goal Status: Active Patient/caregiver will verbalize understanding of skin care regimen Date Initiated: 09/17/2015 Goal Status: Active Patient/caregiver will verbalize/demonstrate measures taken to improve the patient's personal safety Date Initiated: 09/17/2015 Goal Status: Active Patient/caregiver will verbalize/demonstrate measures taken to prevent injury and/or falls Date Initiated: 09/17/2015 Goal Status: Active Patient/caregiver will verbalize/demonstrate understanding of what to do in case of emergency Date Initiated: 09/17/2015 Goal Status: Active Interventions: Assess fall risk on admission and as needed Assess: immobility, friction, shearing, incontinence upon admission and as needed Assess  impairment of mobility on admission and as needed per policy Assess self care needs on admission and as needed Provide education on basic hygiene Provide education on fall prevention Provide education on personal and home safety Provide education on safe transfers Roberta Hampton, Roberta Hampton ([id number]) Notes: Orientation to the Wound Care Program Nursing Diagnoses: Knowledge deficit related to the wound healing center program Goals: Patient/caregiver will verbalize understanding of the [place] [place] Date Initiated: 09/17/2015 Goal Status: Active Interventions: Provide education on orientation to the wound center Notes: Wound/Skin Impairment Nursing Diagnoses: Impaired tissue integrity Knowledge deficit related to ulceration/compromised skin integrity Goals: Patient/caregiver will verbalize understanding of skin care regimen Date Initiated: 09/17/2015 Goal Status: Active Ulcer/skin breakdown will have a volume reduction of 30% by week 4 Date Initiated: 09/17/2015 Goal Status: Active Ulcer/skin breakdown will have a volume reduction of 50% by week 8 Date Initiated: 09/17/2015 Goal Status: Active Ulcer/skin breakdown will have a volume reduction of 80% by week 12 Date Initiated: 09/17/2015 Goal Status: Active Ulcer/skin breakdown will heal within 14 weeks Date Initiated: 09/17/2015 Goal Status: Active Interventions: Assess patient/caregiver ability to obtain necessary supplies Assess patient/caregiver ability to perform ulcer/skin care regimen upon admission and as needed Assess ulceration(s) every visit Provide education on ulcer and skin care Roberta Hampton, Roberta Hampton ([id number]) Notes: Electronic Signature(s) Signed: 10/07/2015 5:03:09 PM  By: Alric Quan Entered By: Alric Quan on 10/05/2015 10:05:59 Roam, Roberta Hampton ([id number]) -------------------------------------------------------------------------------- Pain Assessment Details Patient Name: Roberta Hampton, Roberta Hampton. Date of Service: 10/05/2015 10:00 AM Medical Record Number: [id number] Patient Account Number: [id number] Date of Birth/Sex: 1966/04/28 (50 y.o. Female) Treating RN: Carolyne Fiscal, Debi Primary Care Physician: [name] Other Clinician: Referring Physician: [name] Treating Physician/Extender: Frann Rider in Treatment: 2 Active Problems Location of Pain Severity and Description of Pain Patient Has Paino No Site Locations Pain Management and Medication Current Pain Management: Electronic Signature(s) Signed: 10/07/2015 5:03:09 PM By: Alric Quan Entered By: Alric Quan on 10/05/2015 09:59:09 Roberta Hampton, Roberta Hampton ([id number]) -------------------------------------------------------------------------------- Patient/Caregiver Education Details Patient Name: Roberta Hampton, BRYERS. Date of Service: 10/05/2015 10:00 AM Medical Record Number: [id number] Patient Account Number: [id number] Date of Birth/Gender: May 11, 1966 (50 y.o. Female) Treating RN: Carolyne Fiscal, Debi Primary Care Physician: [name] Other Clinician: Referring Physician: [name] Treating Physician/Extender: Frann Rider in Treatment: 2 Education Assessment Education Provided To: Caregiver Education Topics Provided Wound/Skin Impairment: Handouts: Other: change dressing as directed Methods: Demonstration, Explain/Verbal Responses: State content correctly Electronic Signature(s) Signed: 10/07/2015 5:03:09 PM By: Alric Quan Entered By: Alric Quan on 10/05/2015 10:23:18 Kreamer, Roberta Hampton ([id number]) -------------------------------------------------------------------------------- Wound Assessment Details Patient Name: Roberta Hampton. Date of Service: 10/05/2015 10:00 AM Medical Record Number: [id number] Patient Account Number: [id number] Date of Birth/Sex: 1966-06-11 (50 y.o. Female) Treating RN: Carolyne Fiscal, Debi Primary Care Physician:  [name] Other Clinician: Referring Physician: [name] Treating Physician/Extender: Frann Rider in Treatment: 2 Wound Status Wound Number: 1 Primary Etiology: Trauma, Other Wound Location: Left Lower Leg - Lateral Wound Status: Open Wounding Event: Surgical Injury Comorbid History: Seizure Disorder Date Acquired: 05/07/2015 Weeks Of Treatment: 2 Clustered Wound: No Photos Photo Uploaded By: Alric Quan on 10/05/2015 16:29:36 Wound Measurements Length: (cm) 1.3 Width: (cm) 0.7 Depth: (cm) 0.1 Area: (cm) 0.715 Volume: (cm) 0.071 % Reduction in Area: 80.4% % Reduction in Volume: 80.5% Epithelialization: Small (1-33%) Tunneling: No Undermining: No Wound Description Classification: Partial Thickness Foul Odor A Wound Margin: Flat and Intact Exudate Amount: Medium Exudate Type: Serosanguineous Exudate Color: red, brown fter Cleansing: No Wound Bed Granulation Amount: Large (67-100%) Exposed Structure Granulation Quality: Red, Pink, Hyper-granulation Fascia Exposed: No Necrotic Amount: None Present (0%) Fat Layer Exposed: No Tendon Exposed: No KRISHA, FANTAUZZI R. ([id number]) Muscle Exposed: No Joint Exposed: No Bone Exposed: No Limited to Skin Breakdown Periwound Skin Texture Texture Color No Abnormalities Noted: No No Abnormalities Noted: No Localized Edema: No Temperature / Pain Moisture Temperature: No Abnormality No Abnormalities Noted: No Tenderness on Palpation: Yes Moist: No Wound Preparation Ulcer Cleansing: Rinsed/Irrigated with Saline Topical Anesthetic Applied: Other: lidocaine 4%, Treatment Notes Wound #1 (Left, Lateral Lower Leg) 1. Cleansed with: Clean wound with Normal Saline 2. Anesthetic Topical Lidocaine 4% cream to wound bed prior to debridement 3. Peri-wound Care: Barrier cream 4. Dressing Applied: Hydrafera Blue 5. Secondary Dressing Applied Gauze and Kerlix/Conform 7. Secured  with Recruitment consultant) Signed: 10/07/2015 5:03:09 PM By: Alric Quan Entered By: Alric Quan on 10/05/2015 10:39:52 Roberta Hampton ([id number]) -------------------------------------------------------------------------------- Vitals Details Patient Name: Roberta Hampton. Date of Service: 10/05/2015 10:00 AM Medical Record Number: [id number] Patient Account Number: [id number] Date of Birth/Sex: April 20, 1966 (50 y.o. Female) Treating RN: Carolyne Fiscal, Debi Primary Care Physician: [name] Other Clinician: Referring Physician: [name] Treating Physician/Extender: Frann Rider in Treatment: 2 Vital Signs Time Taken: 09:59 Pulse (bpm): 89 Respiratory Rate (breaths/min): 16 Blood Pressure (mmHg): 124/77 Reference Range: 80 -  120 mg / dl Electronic Signature(s) Signed: 10/07/2015 5:03:09 PM By: Alric Quan Entered By: Alric Quan on 10/05/2015 09:59:44

## 2015-10-12 ENCOUNTER — Encounter: Payer: Medicare Other | Attending: Surgery | Admitting: Surgery

## 2015-10-12 DIAGNOSIS — Y839 Surgical procedure, unspecified as the cause of abnormal reaction of the patient, or of later complication, without mention of misadventure at the time of the procedure: Secondary | ICD-10-CM | POA: Diagnosis not present

## 2015-10-12 DIAGNOSIS — L97322 Non-pressure chronic ulcer of left ankle with fat layer exposed: Secondary | ICD-10-CM | POA: Insufficient documentation

## 2015-10-12 DIAGNOSIS — G40909 Epilepsy, unspecified, not intractable, without status epilepticus: Secondary | ICD-10-CM | POA: Diagnosis not present

## 2015-10-12 DIAGNOSIS — T8131XA Disruption of external operation (surgical) wound, not elsewhere classified, initial encounter: Secondary | ICD-10-CM | POA: Insufficient documentation

## 2015-10-12 DIAGNOSIS — F819 Developmental disorder of scholastic skills, unspecified: Secondary | ICD-10-CM | POA: Insufficient documentation

## 2015-10-13 NOTE — Progress Notes (Signed)
KATE, VISSERS ([id number]) Visit Report for 10/12/2015 Arrival Information Details Patient Name: Roberta Hampton, Roberta Hampton. Date of Service: 10/12/2015 10:45 AM Medical Record Number: [id number] Patient Account Number: [id number] Date of Birth/Sex: 1966-01-25 (50 y.o. Female) Treating RN: Carolyne Fiscal, Debi Primary Care Physician: [name] Other Clinician: Referring Physician: [name] Treating Physician/Extender: Frann Rider in Treatment: 3 Visit Information History Since Last Visit All ordered tests and consults were completed: No Patient Arrived: Ambulatory Added or deleted any medications: No Arrival Time: 10:55 Any new allergies or adverse reactions: No Accompanied By: caregiver Had a fall or experienced change in No Transfer Assistance: None activities of daily living that may affect Patient Identification Verified: Yes risk of falls: Secondary Verification Process Yes Signs or symptoms of abuse/neglect since last No Completed: visito Patient Requires Transmission-Based No Hospitalized since last visit: No Precautions: Pain Present Now: No Patient Has Alerts: No Electronic Signature(s) Signed: 10/12/2015 4:43:39 PM By: Alric Quan Entered By: Alric Quan on 10/12/2015 10:55:40 Roberta Hampton ([id number]) -------------------------------------------------------------------------------- Encounter Discharge Information Details Patient Name: Roberta Hampton. Date of Service: 10/12/2015 10:45 AM Medical Record Number: [id number] Patient Account Number: [id number] Date of Birth/Sex: Apr 03, 1966 (50 y.o. Female) Treating RN: Carolyne Fiscal, Debi Primary Care Physician: [name] Other Clinician: Referring Physician: [name] Treating Physician/Extender: Frann Rider in Treatment: 3 Encounter Discharge Information Items Discharge Pain Level: 0 Discharge Condition: Stable Ambulatory Status:  Ambulatory Discharge Destination: Nursing Home Transportation: Other Accompanied By: caregiver Schedule Follow-up Appointment: No Medication Reconciliation completed and provided to Patient/Care No Garlan Drewes: Provided on Clinical Summary of Care: 10/12/2015 Form Type Recipient Paper Patient CC Electronic Signature(s) Signed: 10/12/2015 11:22:56 AM By: Ruthine Dose Entered By: Ruthine Dose on 10/12/2015 11:22:56 Roberta Hampton ([id number]) -------------------------------------------------------------------------------- Lower Extremity Assessment Details Patient Name: Roberta Hampton. Date of Service: 10/12/2015 10:45 AM Medical Record Number: [id number] Patient Account Number: [id number] Date of Birth/Sex: [birth year] (50 y.o. Female) Treating RN: Carolyne Fiscal, Debi Primary Care Physician: [name] Other Clinician: Referring Physician: [name] Treating Physician/Extender: Frann Rider in Treatment: 3 Edema Assessment Assessed: [Left: No] [Right: No] E[Left: dema] [Right: :] Calf Left: Right: Point of Measurement: 28 cm From Medial Instep 33.2 cm cm Ankle Left: Right: Point of Measurement: 11 cm From Medial Instep 20 cm cm Vascular Assessment Pulses: Posterior Tibial Dorsalis Pedis Palpable: [Left:Yes] Extremity colors, hair growth, and conditions: Extremity Color: [Left:Normal] Temperature of Extremity: [Left:Warm] Capillary Refill: [Left:< 3 seconds] Toe Nail Assessment Left: Right: Thick: No Discolored: No Deformed: No Improper Length and Hygiene: No Electronic Signature(s) Signed: 10/12/2015 4:43:39 PM By: Alric Quan Entered By: Alric Quan on 10/12/2015 11:01:57 Roberta Hampton, Roberta Hampton ([id number]) -------------------------------------------------------------------------------- Multi Wound Chart Details Patient Name: Roberta Hampton. Date of Service: 10/12/2015 10:45 AM Medical Record Number: [id number] Patient Account  Number: [id number] Date of Birth/Sex: [birth year] (50 y.o. Female) Treating RN: Carolyne Fiscal, Debi Primary Care Physician: [name] Other Clinician: Referring Physician: [name] Treating Physician/Extender: Frann Rider in Treatment: 3 Vital Signs Height(in): Pulse(bpm): 80 Weight(lbs): Blood Pressure 101/55 (mmHg): Body Mass Index(BMI): Temperature(F): Respiratory Rate 16 (breaths/min): Photos: [1:No Photos] [N/A:N/A] Wound Location: [1:Left Lower Leg - Lateral] [N/A:N/A] Wounding Event: [1:Surgical Injury] [N/A:N/A] Primary Etiology: [1:Trauma, Other] [N/A:N/A] Comorbid History: [1:Seizure Disorder] [N/A:N/A] Date Acquired: [1:05/07/2015] [N/A:N/A] Weeks of Treatment: [1:3] [N/A:N/A] Wound Status: [1:Open] [N/A:N/A] Measurements L x W x D 1.8x1.2x0.1 [N/A:N/A] (cm) Area (cm) : [1:1.696] [N/A:N/A] Volume (cm) : [1:0.17] [N/A:N/A] % Reduction in Area: [1:53.50%] [N/A:N/A] % Reduction in Volume: 53.30% [N/A:N/A] Classification: [1:Partial Thickness] [  N/A:N/A] Exudate Amount: [1:Large] [N/A:N/A] Exudate Type: [1:Serosanguineous] [N/A:N/A] Exudate Color: [1:red, brown] [N/A:N/A] Wound Margin: [1:Flat and Intact] [N/A:N/A] Granulation Amount: [1:Large (67-100%)] [N/A:N/A] Granulation Quality: [1:Red, Pink, Hyper- granulation] [N/A:N/A] Necrotic Amount: [1:None Present (0%)] [N/A:N/A] Exposed Structures: [1:Fascia: No Fat: No Tendon: No Muscle: No Joint: No Bone: No] [N/A:N/A] Limited to Skin Breakdown Epithelialization: None N/A N/A Debridement: Debridement [id number] N/A N/A [id number]) Time-Out Taken: Yes N/A N/A Pain Control: Other N/A N/A Tissue Debrided: Fibrin/Slough, Exudates, N/A N/A Subcutaneous Level: Skin/Subcutaneous N/A N/A Tissue Debridement Area (sq 2.16 N/A N/A cm): Instrument: Forceps N/A N/A Bleeding: Minimum N/A N/A Hemostasis Achieved: Pressure N/A N/A Procedural Pain: 0 N/A N/A Post Procedural Pain: 0 N/A  N/A Debridement Treatment Procedure was tolerated N/A N/A Response: well Post Debridement 1.8x1.2x0.1 N/A N/A Measurements L x W x D (cm) Post Debridement 0.17 N/A N/A Volume: (cm) Periwound Skin Texture: Edema: No N/A N/A Periwound Skin Moist: No N/A N/A Moisture: Periwound Skin Color: No Abnormalities Noted N/A N/A Temperature: No Abnormality N/A N/A Tenderness on Yes N/A N/A Palpation: Wound Preparation: Ulcer Cleansing: N/A N/A Rinsed/Irrigated with Saline Topical Anesthetic Applied: Other: lidocaine 4% Procedures Performed: Debridement N/A N/A Treatment Notes Electronic Signature(s) Signed: 10/12/2015 4:43:39 PM By: Alric Quan Entered By: Alric Quan on 10/12/2015 [place], [place]. ([id number]) -------------------------------------------------------------------------------- Multi-Disciplinary Care Plan Details Patient Name: Roberta Hampton, Roberta Hampton. Date of Service: 10/12/2015 10:45 AM Medical Record Number: [id number] Patient Account Number: [id number] Date of Birth/Sex: [birth year] (50 y.o. Female) Treating RN: Carolyne Fiscal, Debi Primary Care Physician: [name] Other Clinician: Referring Physician: [name] Treating Physician/Extender: Frann Rider in Treatment: 3 Active Inactive Abuse / Safety / Falls / Self Care Management Nursing Diagnoses: Impaired home maintenance Impaired physical mobility Knowledge deficit related to: safety; personal, health (wound), emergency Potential for falls Self care deficit: actual or potential Goals: Patient will remain injury free Date Initiated: 09/17/2015 Goal Status: Active Patient/caregiver will verbalize understanding of skin care regimen Date Initiated: 09/17/2015 Goal Status: Active Patient/caregiver will verbalize/demonstrate measures taken to improve the patient's personal safety Date Initiated: 09/17/2015 Goal Status: Active Patient/caregiver will verbalize/demonstrate  measures taken to prevent injury and/or falls Date Initiated: 09/17/2015 Goal Status: Active Patient/caregiver will verbalize/demonstrate understanding of what to do in case of emergency Date Initiated: 09/17/2015 Goal Status: Active Interventions: Assess fall risk on admission and as needed Assess: immobility, friction, shearing, incontinence upon admission and as needed Assess impairment of mobility on admission and as needed per policy Assess self care needs on admission and as needed Provide education on basic hygiene Provide education on fall prevention Provide education on personal and home safety Provide education on safe transfers Roberta Hampton, Roberta Hampton ([id number]) Notes: Orientation to the Wound Care Program Nursing Diagnoses: Knowledge deficit related to the wound healing center program Goals: Patient/caregiver will verbalize understanding of the [facility] Date Initiated: 09/17/2015 Goal Status: Active Interventions: Provide education on orientation to the wound center Notes: Wound/Skin Impairment Nursing Diagnoses: Impaired tissue integrity Knowledge deficit related to ulceration/compromised skin integrity Goals: Patient/caregiver will verbalize understanding of skin care regimen Date Initiated: 09/17/2015 Goal Status: Active Ulcer/skin breakdown will have a volume reduction of 30% by week 4 Date Initiated: 09/17/2015 Goal Status: Active Ulcer/skin breakdown will have a volume reduction of 50% by week 8 Date Initiated: 09/17/2015 Goal Status: Active Ulcer/skin breakdown will have a volume reduction of 80% by week 12 Date Initiated: 09/17/2015 Goal Status: Active Ulcer/skin breakdown will heal within 14 weeks Date Initiated: 09/17/2015 Goal Status: Active Interventions:  Assess patient/caregiver ability to obtain necessary supplies Assess patient/caregiver ability to perform ulcer/skin care regimen upon admission and as needed Assess ulceration(s)  every visit Provide education on ulcer and skin care Roberta Hampton, Roberta Hampton ([id number]) Notes: Electronic Signature(s) Signed: 10/12/2015 4:43:39 PM By: Alric Quan Entered By: Alric Quan on 10/12/2015 11:15:54 [place], [place]. ([id number]) -------------------------------------------------------------------------------- Pain Assessment Details Patient Name: Roberta Hampton. Date of Service: 10/12/2015 10:45 AM Medical Record Number: [id number] Patient Account Number: [id number] Date of Birth/Sex: 1966/04/24 (50 y.o. Female) Treating RN: Carolyne Fiscal, Debi Primary Care Physician: [name] Other Clinician: Referring Physician: [name] Treating Physician/Extender: Frann Rider in Treatment: 3 Active Problems Location of Pain Severity and Description of Pain Patient Has Paino No Site Locations Pain Management and Medication Current Pain Management: Electronic Signature(s) Signed: 10/12/2015 4:43:39 PM By: Alric Quan Entered By: Alric Quan on 10/12/2015 10:55:46 Roberta Hampton, Roberta Hampton ([id number]) -------------------------------------------------------------------------------- Patient/Caregiver Education Details Patient Name: Roberta Hampton, Roberta Hampton. Date of Service: 10/12/2015 10:45 AM Medical Record Number: [id number] Patient Account Number: [id number] Date of Birth/Gender: 1966/03/01 (50 y.o. Female) Treating RN: Carolyne Fiscal, Debi Primary Care Physician: [name] Other Clinician: Referring Physician: [name] Treating Physician/Extender: Frann Rider in Treatment: 3 Education Assessment Education Provided To: Caregiver Education Topics Provided Wound/Skin Impairment: Handouts: Other: change dressing as ordered Methods: Demonstration, Explain/Verbal Responses: State content correctly Electronic Signature(s) Signed: 10/12/2015 4:43:39 PM By: Alric Quan Entered By: Alric Quan on 10/12/2015  11:17:19 Roberta Hampton, Roberta Hampton ([id number]) -------------------------------------------------------------------------------- Wound Assessment Details Patient Name: Roberta Hampton. Date of Service: 10/12/2015 10:45 AM Medical Record Number: [id number] Patient Account Number: [id number] Date of Birth/Sex: [birth year] (50 y.o. Female) Treating RN: Carolyne Fiscal, Debi Primary Care Physician: [name] Other Clinician: Referring Physician: [name] Treating Physician/Extender: Frann Rider in Treatment: 3 Wound Status Wound Number: 1 Primary Etiology: Trauma, Other Wound Location: Left Lower Leg - Lateral Wound Status: Open Wounding Event: Surgical Injury Comorbid History: Seizure Disorder Date Acquired: 05/07/2015 Weeks Of Treatment: 3 Clustered Wound: No Photos Photo Uploaded By: Alric Quan on 10/12/2015 13:25:51 Wound Measurements Length: (cm) 1.8 Width: (cm) 1.2 Depth: (cm) 0.1 Area: (cm) 1.696 Volume: (cm) 0.17 % Reduction in Area: 53.5% % Reduction in Volume: 53.3% Epithelialization: None Tunneling: No Undermining: No Wound Description Classification: Partial Thickness Wound Margin: Flat and Intact Exudate Amount: Large Exudate Type: Serosanguineous Exudate Color: red, brown Foul Odor After Cleansing: No Wound Bed Granulation Amount: Large (67-100%) Exposed Structure Granulation Quality: Red, Pink, Hyper-granulation Fascia Exposed: No Necrotic Amount: None Present (0%) Fat Layer Exposed: No Tendon Exposed: No Roberta Hampton, Roberta R. ([id number]) Muscle Exposed: No Joint Exposed: No Bone Exposed: No Limited to Skin Breakdown Periwound Skin Texture Texture Color No Abnormalities Noted: No No Abnormalities Noted: No Localized Edema: No Temperature / Pain Moisture Temperature: No Abnormality No Abnormalities Noted: No Tenderness on Palpation: Yes Moist: No Wound Preparation Ulcer Cleansing: Rinsed/Irrigated with Saline Topical  Anesthetic Applied: Other: lidocaine 4%, Treatment Notes Wound #1 (Left, Lateral Lower Leg) 1. Cleansed with: Clean wound with Normal Saline 2. Anesthetic Topical Lidocaine 4% cream to wound bed prior to debridement 3. Peri-wound Care: Barrier cream Moisturizing lotion 4. Dressing Applied: Hydrafera Blue 5. Secondary Dressing Applied Gauze and Kerlix/Conform 7. Secured with Recruitment consultant) Signed: 10/12/2015 4:43:39 PM By: Alric Quan Entered By: Alric Quan on 10/12/2015 11:03:48 Roberta Hampton ([id number]) -------------------------------------------------------------------------------- Vitals Details Patient Name: Roberta Hampton. Date of Service: 10/12/2015 10:45 AM Medical Record Number: [id number] Patient Account Number: [id number] Date of Birth/Sex: 08/24/1966 (50 y.o. Female)  Treating RN: Ahmed Prima Primary Care Physician: [name] Other Clinician: Referring Physician: [name] Treating Physician/Extender: Frann Rider in Treatment: 3 Vital Signs Time Taken: 10:55 Pulse (bpm): 80 Respiratory Rate (breaths/min): 16 Blood Pressure (mmHg): 101/55 Reference Range: 80 - 120 mg / dl Electronic Signature(s) Signed: 10/12/2015 4:43:39 PM By: Alric Quan Entered By: Alric Quan on 10/12/2015 10:58:11

## 2015-10-13 NOTE — Progress Notes (Signed)
Roberta Hampton ([id number]) Visit Report for 10/12/2015 Chief Complaint Document Details Patient Name: Roberta Hampton, Roberta Hampton 10/12/2015 10:45 Date of Service: AM Medical Record [id number] Number: Patient Account Number: [id number] 07/02/1966 (50 y.o. Treating RN: Ahmed Prima Date of Birth/Sex: Female) Other Clinician: [facility], Treating Primary Care Physician: Ricky Stabs Physician/Extender: Norville Haggard, Referring Physician: Rivka Safer in Treatment: 3 Information Obtained from: Caregiver Chief Complaint Patient presents to the wound care center with open non-healing surgical wound which she's had for about 2 months Electronic Signature(s) Signed: 10/12/2015 11:17:46 AM By: Christin Fudge MD, FACS Entered By: Christin Fudge on 10/12/2015 11:17:46 Roberta Hampton ([id number]) -------------------------------------------------------------------------------- Debridement Details Patient Name: Roberta, Hampton 10/12/2015 10:45 Date of Service: AM Medical Record [id number] Number: Patient Account Number: [id number] 10/21/1965 (50 y.o. Treating RN: Carolyne Fiscal, Debi Date of Birth/Sex: Female) Other Clinician: [facility], Treating Primary Care Physician: Ricky Stabs Physician/Extender: Norville Haggard, Referring Physician: Rivka Safer in Treatment: 3 Debridement Performed for Wound #1 Left,Lateral Lower Leg Assessment: Performed By: Physician Christin Fudge, MD Debridement: Open Wound/Selective Debridement Selective Description: Pre-procedure Yes Verification/Time Out Taken: Start Time: 11:08 Pain Control: Other : lidocaine 4% cream Level: Skin/Epidermis Total Area Debrided (L x 1.8 (cm) x 1.2 (cm) = 2.16 (cm) W): Tissue and other Viable, Non-Viable, Exudate, Fibrin/Slough, Subcutaneous material debrided: Instrument: Forceps Bleeding: Minimum Hemostasis Achieved: Pressure End Time: 11:10 Procedural Pain: 0 Post Procedural Pain:  0 Response to Treatment: Procedure was tolerated well Post Debridement Measurements of Total Wound Length: (cm) 1.8 Width: (cm) 1.2 Depth: (cm) 0.1 Volume: (cm) 0.17 Post Procedure Diagnosis Same as Pre-procedure Electronic Signature(s) Signed: 10/12/2015 11:17:39 AM By: Christin Fudge MD, FACS Roberta Hampton, Roberta Hampton ([id number]) Signed: 10/12/2015 4:43:39 PM By: Alric Quan Entered By: Christin Fudge on 10/12/2015 11:17:39 Roberta Hampton, Roberta Hampton ([id number]) -------------------------------------------------------------------------------- HPI Details Patient Name: Roberta Hampton, Roberta Hampton 10/12/2015 10:45 Date of Service: AM Medical Record [id number] Number: Patient Account Number: [id number] 09-Jan-1966 (50 y.o. Treating RN: Ahmed Prima Date of Birth/Sex: Female) Other Clinician: [facility], Treating Primary Care Physician: Ricky Stabs Physician/Extender: Norville Haggard, Referring Physician: Rivka Safer in Treatment: 3 History of Present Illness Location: left ankle area Quality: Patient reports experiencing a dull pain to affected area(s). Severity: Patient states wound are getting worse. Duration: Patient has had the wound for > 3 months prior to seeking treatment at the wound center Timing: Pain in wound is Intermittent (comes and goes Context: The wound occurred when the patient had a ORIF of the left ankle in September 2016 Modifying Factors: Other treatment(s) tried include: local care as prescribed by the orthopedic surgery group Associated Signs and Symptoms: Patient reports having increase swelling. HPI Description: 50 year old patient who has development delays and has subnormal mental acuity, comes along with her caregivers with a nonhealing wound to the left lateral ankle which has been there for about 2-3 months. She had a ORIF of the left ankle in September 2016 and during follow-up visits was noted to have drainage and cellulitis from the wound which has  been nonhealing. X-rays done in the orthopedic department have been normal as per the patient's caregivers but no reports are available. Past medical history significant for development delays, major depressive disorder, seizure disorder, status post hysterectomy, status post open reduction and internal fixation of bimalleolar left ankle fracture dislocation in September 2016. 10/12/2015 -- they have not received the RTD but have Hydrofera Blue sent to them. Electronic Signature(s) Signed: 10/12/2015 11:18:22 AM By: Christin Fudge MD, FACS Entered By: Christin Fudge on 10/12/2015 11:18:21 Roberta Hampton, Roberta R. (  [id number]) -------------------------------------------------------------------------------- Physical Exam Details Patient Name: Roberta Hampton, Roberta Hampton 10/12/2015 10:45 Date of Service: AM Medical Record [id number] Number: Patient Account Number: [id number] 1965-12-10 (50 y.o. Treating RN: Ahmed Prima Date of Birth/Sex: Female) Other Clinician: [facility], Treating Primary Care Physician: Ricky Stabs Physician/Extender: Norville Haggard, Referring Physician: Rivka Safer in Treatment: 3 Constitutional . Pulse regular. Respirations normal and unlabored. Afebrile. . Eyes Nonicteric. Reactive to light. Ears, Nose, Mouth, and Throat Lips, teeth, and gums WNL.Marland Kitchen Moist mucosa without lesions. Neck supple and nontender. No palpable supraclavicular or cervical adenopathy. Normal sized without goiter. Respiratory WNL. No retractions.. Cardiovascular Pedal Pulses WNL. No clubbing, cyanosis or edema. Lymphatic No adneopathy. No adenopathy. No adenopathy. Musculoskeletal Adexa without tenderness or enlargement.. Digits and nails w/o clubbing, cyanosis, infection, petechiae, ischemia, or inflammatory conditions.. Integumentary (Hair, Skin) No suspicious lesions. No crepitus or fluctuance. No peri-wound warmth or erythema. No masses.Marland Kitchen Psychiatric Judgement and insight Intact..  No evidence of depression, anxiety, or agitation.. Notes the wound has continued to get better and there is minimal hypergranulation tissue and the edges are coming in nicely. Electronic Signature(s) Signed: 10/12/2015 11:19:11 AM By: Christin Fudge MD, FACS Entered By: Christin Fudge on 10/12/2015 11:19:11 Roberta Hampton, Roberta Hampton ([id number]) -------------------------------------------------------------------------------- Physician Orders Details Patient Name: Roberta Hampton, Roberta Hampton 10/12/2015 10:45 Date of Service: AM Medical Record [id number] Number: Patient Account Number: [id number] Jun 28, 1966 (50 y.o. Treating RN: Ahmed Prima Date of Birth/Sex: Female) Other Clinician: [facility], Treating Primary Care Physician: Ricky Stabs Physician/Extender: Norville Haggard, Referring Physician: Rivka Safer in Treatment: 3 Verbal / Phone Orders: Yes Clinician: [place]erton, Debi Read Back and Verified: Yes Diagnosis Coding Wound Cleansing Wound #1 Left,Lateral Lower Leg o Cleanse wound with mild soap and water o May Shower, gently pat wound dry prior to applying new dressing. Anesthetic Wound #1 Left,Lateral Lower Leg o Topical Lidocaine 4% cream applied to wound bed prior to debridement Skin Barriers/Peri-Wound Care Wound #1 Left,Lateral Lower Leg o Barrier cream - zinc oxide paste around wound. Not on wound. o Moisturizing lotion - to dry area only Primary Wound Dressing Wound #1 Left,Lateral Lower Leg o Hydrafera Blue - RTD dressing when at home. Secondary Dressing Wound #1 Left,Lateral Lower Leg o Gauze and Kerlix/Conform Dressing Change Frequency Wound #1 Left,Lateral Lower Leg o Change dressing every week Follow-up Appointments Wound #1 Left,Lateral Lower Leg o Return Appointment in 1 week. [place] ([id number]) Wound #1 Left,Lateral Lower Leg o [place] Visits - *********PLEASE ORDER PTS SUPPLIES INCLUDING  (RTD)******** o Home Health Nurse may visit PRN to address patientos wound care needs. o FACE TO FACE ENCOUNTER: MEDICARE and MEDICAID PATIENTS: I certify that this patient is under my care and that I had a face-to-face encounter that meets the physician face-to-face encounter requirements with this patient on this date. The encounter with the patient was in whole or in part for the following MEDICAL CONDITION: (primary reason for [place]) MEDICAL NECESSITY: I certify, that based on my findings, NURSING services are a medically necessary home health service. HOME BOUND STATUS: I certify that my clinical findings support that this patient is homebound (i.e., Due to illness or injury, pt requires aid of supportive devices such as crutches, cane, wheelchairs, walkers, the use of special transportation or the assistance of another person to leave their place of residence. There is a normal inability to leave the home and doing so requires considerable and taxing effort. Other absences are for medical reasons / religious services and are infrequent or of short duration  when for other reasons). o If current dressing causes regression in wound condition, may D/C ordered dressing product/s and apply Normal Saline Moist Dressing daily until next [place] / Other MD appointment. [place] of regression in wound condition at [phone]. o Please direct any NON-WOUND related issues/requests for orders to patient's Primary Care Physician Electronic Signature(s) Signed: 10/12/2015 4:17:59 PM By: Christin Fudge MD, FACS Signed: 10/12/2015 4:43:39 PM By: Alric Quan Entered By: Alric Quan on 10/12/2015 11:15:42 SAVANNAHA, GRAM ([id number]) -------------------------------------------------------------------------------- Problem List Details Patient Name: Roberta Hampton, Roberta Hampton 10/12/2015 10:45 Date of Service: AM Medical  Record [id number] Number: Patient Account Number: [id number] Dec 22, 1965 (50 y.o. 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 Treating RN: Ahmed Prima Date of Birth/Sex: Female) Other Clinician: [facility], Treating Primary Care Physician: Ricky Stabs Physician/Extender: Norville Haggard, Referring Physician: Rivka Safer in Treatment: 3 Subjective Chief Complaint Information obtained from Caregiver Patient presents to the wound care center with open non-healing surgical wound which she's had for about 2 months History of Present Illness (HPI) The following HPI elements were documented for the patient's wound: Location: left ankle area Quality: Patient reports experiencing a dull pain to affected area(s). Severity: Patient states wound are getting worse. Duration: Patient has had the wound for > 3 months prior to seeking treatment at the wound center Timing: Pain in wound is  Intermittent (comes and goes Context: The wound occurred when the patient had a ORIF of the left ankle in September 2016 Modifying Factors: Other treatment(s) tried include: local care as prescribed by the orthopedic surgery group Associated Signs and Symptoms: Patient reports having increase swelling. 50 year old patient who has development delays and has subnormal mental acuity, comes along with her caregivers with a nonhealing wound to the left lateral ankle which has been there for about 2-3 months. She had a ORIF of the left ankle in September 2016 and during follow-up visits was noted to have drainage and cellulitis from the wound which has been nonhealing. X-rays done in the orthopedic department have been normal as per the patient's caregivers but no reports are available. Past medical history significant for development delays, major depressive disorder, seizure disorder, status post hysterectomy, status post open reduction and internal fixation of bimalleolar left ankle fracture dislocation in September 2016. 10/12/2015 -- they have not received the RTD but have Hydrofera Blue sent to them. Roberta Hampton, Roberta Hampton ([id number]) Objective Constitutional Pulse regular. Respirations normal and unlabored. Afebrile. Vitals Time Taken: 10:55 AM, Pulse: 80 bpm, Respiratory Rate: 16 breaths/min, Blood Pressure: 101/55 mmHg. Eyes Nonicteric. Reactive to light. Ears, Nose, Mouth, and Throat Lips, teeth, and gums WNL.Marland Kitchen Moist mucosa without lesions. Neck supple and nontender. No palpable supraclavicular or cervical adenopathy. Normal sized without goiter. Respiratory WNL. No retractions.. Cardiovascular Pedal Pulses WNL. No clubbing, cyanosis or edema. Lymphatic  No adneopathy. No adenopathy. No adenopathy. Musculoskeletal Adexa without tenderness or enlargement.. Digits and nails w/o clubbing, cyanosis, infection, petechiae, ischemia, or inflammatory conditions.Marland Kitchen Psychiatric Judgement and  insight Intact.. No evidence of depression, anxiety, or agitation.. General Notes: the wound has continued to get better and there is minimal hypergranulation tissue and the edges are coming in nicely. Integumentary (Hair, Skin) No suspicious lesions. No crepitus or fluctuance. No peri-wound warmth or erythema. No masses.. Wound #1 status is Open. Original cause of wound was Surgical Injury. The wound is located on the Left,Lateral Lower Leg. The wound measures 1.8cm length x 1.2cm width x 0.1cm depth; 1.696cm^2 area and 0.17cm^3 volume. The wound is limited to skin breakdown. There is no tunneling or undermining noted. There is a large amount of serosanguineous drainage noted. The wound margin is flat and intact. There is large (67-100%) red, pink granulation within the wound bed. There is no necrotic tissue within the wound bed. The periwound skin appearance did not exhibit: Localized Edema, Moist. Periwound temperature was noted as No Abnormality. The periwound has tenderness on palpation. 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 There was a Skin/Epidermis Open Wound/Selective [phone]) debridement with total area of 2.16 sq cm performed by Christin Fudge, MD. with the following instrument(s): Forceps to remove Viable and Non-Viable tissue/material including Exudate, Fibrin/Slough, and Subcutaneous after achieving pain control using Other (lidocaine 4% cream). A time out was conducted prior to the start of the procedure. A Minimum amount of bleeding was controlled with Pressure. The procedure was tolerated well with a pain level of 0  throughout and a pain level of 0 following the procedure. Post Debridement Measurements: 1.8cm length x 1.2cm width x 0.1cm depth; 0.17cm^3 volume. Post procedure Diagnosis Wound #1: Same as Pre-Procedure Plan Wound Cleansing: Wound #1 Left,Lateral Lower Leg: Cleanse wound with mild soap and water May Shower, gently pat wound dry prior to applying new dressing. Anesthetic: Wound #1 Left,Lateral Lower Leg: Topical Lidocaine 4% cream applied to wound bed prior to debridement Skin Barriers/Peri-Wound Care: Wound #1 Left,Lateral Lower Leg: Barrier cream - zinc oxide paste around wound. Not on wound. Moisturizing lotion - to dry area only Primary Wound Dressing: Wound #1 Left,Lateral Lower Leg: WADIYA, PHANN ([id number]) Hydrafera Blue - RTD dressing when at home. Secondary Dressing: Wound #1 Left,Lateral Lower Leg: Gauze and Kerlix/Conform Dressing Change Frequency: Wound #1 Left,Lateral Lower Leg: Change dressing every week Follow-up Appointments: Wound #1 Left,Lateral Lower Leg: Return Appointment in 1 week. Home Health: Wound #1 Left,Lateral Lower Leg: Continue Home Health Visits - *********PLEASE ORDER PTS SUPPLIES INCLUDING (RTD)******** Home Health Nurse may visit PRN to address patient s wound care needs. FACE TO FACE ENCOUNTER: MEDICARE and MEDICAID PATIENTS: I certify that this patient is under my care and that I had a face-to-face encounter that meets the physician face-to-face encounter requirements with this patient on this date. The encounter with the patient was in whole or in part for the following MEDICAL CONDITION: (primary reason for [place]) MEDICAL NECESSITY: I certify, that based on my findings, NURSING services are a medically necessary home health service. HOME BOUND STATUS: I certify that my clinical findings support that this patient is homebound (i.e., Due to  illness or injury, pt requires aid of supportive devices such as crutches, cane,  wheelchairs, walkers, the use of special transportation or the assistance of another person to leave their place of residence. There is a normal inability to leave the home and doing so requires considerable and taxing effort. Other absences are for medical reasons / religious services and are infrequent or of short duration when for other reasons). If current dressing causes regression in wound condition, may D/C ordered dressing product/s and apply Normal Saline Moist Dressing daily until next [place] / Other MD appointment. [place] of regression in wound condition at [phone]. Please direct any NON-WOUND related issues/requests for orders to patient's Primary Care Physician Would continue with [facility] and local dressing to be changed every other day and see her back next week. Electronic Signature(s) Signed: 10/12/2015 11:19:53 AM By: Christin Fudge MD, FACS Entered By: Christin Fudge on 10/12/2015 11:19:53 FERNE, [place] ([id number]) -------------------------------------------------------------------------------- SuperBill Details Patient Name: SHONITA, ACKROYD. Date of Service: 10/12/2015 Medical Record Number: [id number] Patient Account Number: [id number] Date of Birth/Sex: 04/09/1966 (50 y.o. 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 Facility Procedures CPT4: Description Modifier Quantity Code [id number] - DEBRIDE WOUND 1ST 20 SQ CM OR < 1 ICD-10 Description Diagnosis T81.31XA Disruption  of external operation (surgical) wound, not elsewhere classified, initial encounter F81.9 Developmental disorder  of scholastic skills, unspecified L97.322 Non-pressure chronic ulcer of left ankle with fat layer exposed Physician Procedures CPT4: Description Modifier Quantity Code [id number] - WC PHYS DEBR WO ANESTH 20 SQ CM 1 ICD-10 Description Diagnosis T81.31XA Disruption of external operation (surgical) wound, not elsewhere classified, initial encounter F81.9 Developmental disorder  of scholastic skills, unspecified L97.322 Non-pressure chronic ulcer of left ankle with fat layer exposed Electronic Signature(s) Signed: 10/12/2015 11:20:05 AM By: Christin Fudge MD, FACS Entered By: Christin Fudge on 10/12/2015 11:20:05

## 2015-10-19 ENCOUNTER — Encounter: Payer: Medicare Other | Admitting: Surgery

## 2015-10-19 DIAGNOSIS — L97322 Non-pressure chronic ulcer of left ankle with fat layer exposed: Secondary | ICD-10-CM | POA: Diagnosis not present

## 2015-10-20 NOTE — Progress Notes (Signed)
Roberta Hampton, Roberta Hampton ([id number]) Visit Report for 10/19/2015 Arrival Information Details Patient Name: Roberta Hampton, Roberta Hampton 10/19/2015 10:45 Date of Service: AM Medical Record [id number] Number: Patient Account Number: [id number] 1965-12-29 (50 y.o. Treating RN: Macarthur Critchley Date of Birth/Sex: Female) Other Clinician: [facility], Treating Primary Care Physician: Ricky Stabs Physician/Extender: Norville Haggard, Referring Physician: Rivka Safer in Treatment: 4 Visit Information History Since Last Visit All ordered tests and consults were completed: No Patient Arrived: Ambulatory Added or deleted any medications: No Arrival Time: 10:58 Any new allergies or adverse reactions: No Accompanied By: caregiver Had a fall or experienced change in No Transfer Assistance: None activities of daily living that may affect Patient Identification Verified: Yes risk of falls: Secondary Verification Process Yes Signs or symptoms of abuse/neglect since last No Completed: visito Patient Requires Transmission-Based No Hospitalized since last visit: No Precautions: Has Dressing in Place as Prescribed: Yes Patient Has Alerts: No Pain Present Now: No Electronic Signature(s) Signed: 10/19/2015 4:28:54 PM By: Rebecca Eaton, RN, Sendra Entered By: Rebecca Eaton RN, Sendra on 10/19/2015 11:01:06 Roberta Hampton ([id number]) -------------------------------------------------------------------------------- Clinic Level of Care Assessment Details Patient Name: Roberta Hampton, Roberta Hampton 10/19/2015 10:45 Date of Service: AM Medical Record [id number] Number: Patient Account Number: [id number] March 11, 1966 (50 y.o. Treating RN: Macarthur Critchley Date of Birth/Sex: Female) Other Clinician: [facility], Treating Primary Care Physician: Ricky Stabs Physician/Extender: Norville Haggard, Referring Physician: Rivka Safer in Treatment: 4 Clinic Level of Care Assessment Items TOOL 4 Quantity Score X  - Use when only an EandM is performed on FOLLOW-UP visit 1 0 ASSESSMENTS - Nursing Assessment / Reassessment X - Reassessment of Co-morbidities (includes updates in patient status) 1 10 X - Reassessment of Adherence to Treatment Plan 1 5 ASSESSMENTS - Wound and Skin Assessment / Reassessment []  - Simple Wound Assessment / Reassessment - one wound 0 X - Complex Wound Assessment / Reassessment - multiple wounds 2 5 []  - Dermatologic / Skin Assessment (not related to wound area) 0 ASSESSMENTS - Focused Assessment []  - Circumferential Edema Measurements - multi extremities 0 []  - Nutritional Assessment / Counseling / Intervention 0 []  - Lower Extremity Assessment (monofilament, tuning fork, pulses) 0 X - Peripheral Arterial Disease Assessment (using hand held doppler) 1 10 ASSESSMENTS - Ostomy and/or Continence Assessment and Care []  - Incontinence Assessment and Management 0 []  - Ostomy Care Assessment and Management (repouching, etc.) 0 PROCESS - Coordination of Care X - Simple Patient / Family Education for ongoing care 1 15 []  - Complex (extensive) Patient / Family Education for ongoing care 0 X - Staff obtains Consents, Records, Test Results / Process Orders 1 10 Roberta Hampton, Roberta Hampton R. ([id number]) X - Staff telephones HHA, Nursing Homes / Clarify orders / etc 1 10 []  - Routine Transfer to another Facility (non-emergent condition) 0 []  - Routine Hospital Admission (non-emergent condition) 0 []  - New Admissions / Biomedical engineer / Ordering NPWT, Apligraf, etc. 0 []  - Emergency Hospital Admission (emergent condition) 0 X - Simple Discharge Coordination 1 10 []  - Complex (extensive) Discharge Coordination 0 PROCESS - Special Needs []  - Pediatric / Minor Patient Management 0 []  - Isolation Patient Management 0 []  - Hearing / Language / Visual special needs 0 []  - Assessment of Community assistance (transportation, D/C planning, etc.) 0 []  - Additional assistance / Altered mentation  0 []  - Support Surface(s) Assessment (bed, cushion, seat, etc.) 0 INTERVENTIONS - Wound Cleansing / Measurement []  - Simple Wound Cleansing - one wound 0 X - Complex Wound Cleansing - multiple wounds 2 5 X -  Wound Imaging (photographs - any number of wounds) 1 5 []  - Wound Tracing (instead of photographs) 0 X - Simple Wound Measurement - one wound 1 5 []  - Complex Wound Measurement - multiple wounds 0 INTERVENTIONS - Wound Dressings X - Small Wound Dressing one or multiple wounds 2 10 []  - Medium Wound Dressing one or multiple wounds 0 []  - Large Wound Dressing one or multiple wounds 0 []  - Application of Medications - topical 0 Roberta Hampton, Roberta R. ([id number]) []  - Application of Medications - injection 0 INTERVENTIONS - Miscellaneous []  - External ear exam 0 []  - Specimen Collection (cultures, biopsies, blood, body fluids, etc.) 0 []  - Specimen(s) / Culture(s) sent or taken to Lab for analysis 0 []  - Patient Transfer (multiple staff / Civil Service fast streamer / Similar devices) 0 []  - Simple Staple / Suture removal (25 or less) 0 []  - Complex Staple / Suture removal (26 or more) 0 []  - Hypo / Hyperglycemic Management (close monitor of Blood Glucose) 0 []  - Ankle / Brachial Index (ABI) - do not check if billed separately 0 X - Vital Signs 1 5 Has the patient been seen at the hospital within the last three years: Yes Total Score: 125 Level Of Care: New/Established - Level 4 Electronic Signature(s) Signed: 10/19/2015 4:28:54 PM By: Rebecca Eaton, RN, Sendra Entered By: Rebecca Eaton RN, Sendra on 10/19/2015 13:33:30 Roberta Hampton ([id number]) -------------------------------------------------------------------------------- Encounter Discharge Information Details Patient Name: Roberta Hampton, Roberta Hampton 10/19/2015 10:45 Date of Service: AM Medical Record [id number] Number: Patient Account Number: [id number] [birth year] (50 y.o. Treating RN: Macarthur Critchley Date of Birth/Sex: Female) Other  Clinician: [facility], Treating Primary Care Physician: Ricky Stabs Physician/Extender: Norville Haggard, Referring Physician: Rivka Safer in Treatment: 4 Encounter Discharge Information Items Facility Notification Discharge Pain Level: 0 Facility Type: Home Health Discharge Condition: Stable Orders Sent: Yes Ambulatory Status: Ambulatory Discharge Destination: Nursing Home Transportation: Private Auto Accompanied By: caregiver Schedule Follow-up Appointment: Yes Medication Reconciliation completed and provided to Patient/Care Yes Hoy Fallert: Provided on Clinical Summary of Care: 10/19/2015 Form Type Recipient Paper Patient CC Electronic Signature(s) Signed: 10/19/2015 4:28:54 PM By: Rebecca Eaton RN, Sendra Previous Signature: 10/19/2015 11:22:14 AM Version By: Ruthine Dose Entered By: Rebecca Eaton RN, Sendra on 10/19/2015 11:26:49 Roberta Hampton ([id number]) -------------------------------------------------------------------------------- Lower Extremity Assessment Details Patient Name: Roberta Hampton, Roberta Hampton 10/19/2015 10:45 Date of Service: AM Medical Record [id number] Number: Patient Account Number: [id number] 09-16-1965 (50 y.o. Treating RN: Macarthur Critchley Date of Birth/Sex: Female) Other Clinician: [facility], Treating Primary Care Physician: Ricky Stabs Physician/Extender: Norville Haggard, Referring Physician: Rivka Safer in Treatment: 4 Edema Assessment Assessed: [Left: No] [Right: No] Edema: [Left: N] [Right: o] Vascular Assessment Pulses: Posterior Tibial Palpable: [Left:Yes] Dorsalis Pedis Palpable: [Left:Yes] Extremity colors, hair growth, and conditions: Extremity Color: [Left:Normal] Hair Growth on Extremity: [Left:Yes] Temperature of Extremity: [Left:Warm] Capillary Refill: [Left:< 3 seconds] Toe Nail Assessment Left: Right: Thick: No Discolored: No Deformed: No Improper Length and Hygiene: No Electronic  Signature(s) Signed: 10/19/2015 4:28:54 PM By: Rebecca Eaton, RN, Sendra Entered By: Rebecca Eaton RN, Sendra on 10/19/2015 11:09:36 Roberta Hampton ([id number]) -------------------------------------------------------------------------------- Pain Assessment Details Patient Name: Roberta Hampton, Roberta Hampton 10/19/2015 10:45 Date of Service: AM Medical Record [id number] Number: Patient Account Number: [id number] [birth year] (50 y.o. Treating RN: Macarthur Critchley Date of Birth/Sex: Female) Other Clinician: [facility], Treating Primary Care Physician: Ricky Stabs Physician/Extender: Norville Haggard, Referring Physician: Rivka Safer in Treatment: 4 Active Problems Location of Pain Severity and Description of Pain Patient Has Paino No Site Locations Rate the pain. Current Pain Level: 0 Pain  Management and Medication Current Pain Management: Electronic Signature(s) Signed: 10/19/2015 4:28:54 PM By: Rebecca Eaton, RN, Sendra Entered By: Rebecca Eaton RN, Sendra on 10/19/2015 11:01:15 DESI, FRED ([id number]) -------------------------------------------------------------------------------- Patient/Caregiver Education Details Patient Name: Roberta Hampton, Roberta Hampton 10/19/2015 10:45 Date of Service: AM Medical Record [id number] Number: Patient Account Number: [id number] October 10, 1965 (50 y.o. Treating RN: Macarthur Critchley Date of Birth/Gender: Female) Other Clinician: [facility], Treating Primary Care Physician: Ricky Stabs Physician/Extender: Wynn Banker in Treatment: 4 Referring Physician: [facility] Education Assessment Education Provided To: Patient Education Topics Provided Wound/Skin Impairment: Handouts: Caring for Your Ulcer, Skin Care Do's and Dont's Methods: Explain/Verbal Responses: State content correctly Electronic Signature(s) Signed: 10/19/2015 4:28:54 PM By: Rebecca Eaton RN, Sendra Entered By: Rebecca Eaton RN, Sendra on 10/19/2015 11:26:15 Roberta Hampton, Roberta Hampton  ([id number]) -------------------------------------------------------------------------------- Wound Assessment Details Patient Name: Roberta Hampton, Roberta Hampton 10/19/2015 10:45 Date of Service: AM Medical Record [id number] Number: Patient Account Number: [id number] 1965/11/06 (50 y.o. Treating RN: Macarthur Critchley Date of Birth/Sex: Female) Other Clinician: [facility], Treating Primary Care Physician: Ricky Stabs Physician/Extender: Norville Haggard, Referring Physician: Rivka Safer in Treatment: 4 Wound Status Wound Number: 1 Primary Etiology: Trauma, Other Wound Location: Left Lower Leg - Lateral Wound Status: Open Wounding Event: Surgical Injury Comorbid History: Seizure Disorder Date Acquired: 05/07/2015 Weeks Of Treatment: 4 Clustered Wound: No Photos Photo Uploaded By: Alric Quan on 10/19/2015 16:23:56 Wound Measurements Length: (cm) 0.6 Width: (cm) 1.2 Depth: (cm) 0.1 Area: (cm) 0.565 Volume: (cm) 0.057 % Reduction in Area: 84.5% % Reduction in Volume: 84.3% Epithelialization: Small (1-33%) Tunneling: No Undermining: No Wound Description Classification: Partial Thickness Wound Margin: Flat and Intact Exudate Amount: Medium Exudate Type: Serosanguineous Exudate Color: red, brown Foul Odor After Cleansing: No Wound Bed Granulation Amount: Large (67-100%) Exposed Structure JOSSALYN, MORGADO R. ([id number]) Granulation Quality: Red, Pink, Hyper-granulation Fascia Exposed: No Necrotic Amount: None Present (0%) Fat Layer Exposed: No Tendon Exposed: No Muscle Exposed: No Joint Exposed: No Bone Exposed: No Limited to Skin Breakdown Periwound Skin Texture Texture Color No Abnormalities Noted: No No Abnormalities Noted: No Localized Edema: No Temperature / Pain Moisture Temperature: No Abnormality No Abnormalities Noted: No Tenderness on Palpation: Yes Moist: No Wound Preparation Ulcer Cleansing: Rinsed/Irrigated with Saline Topical Anesthetic  Applied: Other: lidocaine 4%, Electronic Signature(s) Signed: 10/19/2015 4:28:54 PM By: Rebecca Eaton, RN, Sendra Entered By: Rebecca Eaton RN, Sendra on 10/19/2015 11:06:29 BAILEIGH, HOHEISEL ([id number]) -------------------------------------------------------------------------------- Wound Assessment Details Patient Name: LIVINGSTON, SENTERS 10/19/2015 10:45 Date of Service: AM Medical Record [id number] Number: Patient Account Number: [id number] 1966/07/25 (50 y.o. Treating RN: Macarthur Critchley Date of Birth/Sex: Female) Other Clinician: [facility], Treating Primary Care Physician: Ricky Stabs Physician/Extender: Norville Haggard, Referring Physician: Rivka Safer in Treatment: 4 Wound Status Wound Number: 2 Primary Etiology: Trauma, Other Wound Location: Left Lower Leg - Lateral, Wound Status: Open Proximal Comorbid History: Seizure Disorder Wounding Event: Trauma Date Acquired: 10/14/2015 Weeks Of Treatment: 0 Clustered Wound: No Photos Photo Uploaded By: Alric Quan on 10/19/2015 16:23:57 Wound Measurements Length: (cm) 0.8 Width: (cm) 0.4 Depth: (cm) 0.1 Area: (cm) 0.251 Volume: (cm) 0.025 % Reduction in Area: % Reduction in Volume: Epithelialization: Small (1-33%) Tunneling: No Undermining: No Wound Description Classification: Partial Thickness Wound Margin: Flat and Intact Exudate Amount: Medium Exudate Type: Serosanguineous Exudate Color: red, brown Foul Odor After Cleansing: No Wound Bed ANYAH, MORALEZ R. ([id number]) Granulation Amount: Large (67-100%) Granulation Quality: Red Necrotic Amount: None Present (0%) Periwound Skin Texture Texture Color No Abnormalities Noted: No No Abnormalities Noted: No Moisture No Abnormalities Noted: No Wound Preparation Ulcer Cleansing: Rinsed/Irrigated  with Saline Topical Anesthetic Applied: Other: lidocaine 4%, Treatment Notes Wound #2 (Left, Proximal, Lateral Lower Leg) 1. Cleansed with: Clean  wound with Normal Saline 2. Anesthetic Topical Lidocaine 4% cream to wound bed prior to debridement 3. Peri-wound Care: Barrier cream 4. Dressing Applied: Hydrafera Blue 5. Secondary Dressing Applied Gauze and Kerlix/Conform Electronic Signature(s) Signed: 10/19/2015 4:28:54 PM By: Rebecca Eaton, RN, Sendra Entered By: Rebecca Eaton RN, Sendra on 10/19/2015 11:07:49 TASHALA, KLOCKO ([id number]) -------------------------------------------------------------------------------- Vitals Details Patient Name: UGANDA, MATCZAK 10/19/2015 10:45 Date of Service: AM Medical Record [id number] Number: Patient Account Number: [id number] [birth year] (50 y.o. Treating RN: Macarthur Critchley Date of Birth/Sex: Female) Other Clinician: [facility], Treating Primary Care Physician: Ricky Stabs Physician/Extender: Norville Haggard, Referring Physician: Rivka Safer in Treatment: 4 Vital Signs Time Taken: 11:00 Temperature (F): 97.5 Pulse (bpm): 77 Blood Pressure (mmHg): 104/74 Reference Range: 80 - 120 mg / dl Electronic Signature(s) Signed: 10/19/2015 4:28:54 PM By: Rebecca Eaton RN, Sendra Entered By: Rebecca Eaton RN, Sendra on 10/19/2015 11:01:34

## 2015-10-20 NOTE — Progress Notes (Signed)
Roberta Hampton, Roberta Hampton ([id number]) Visit Report for 10/19/2015 Chief Complaint Document Details Patient Name: Roberta Hampton, Roberta Hampton 10/19/2015 10:45 Date of Service: AM Medical Record [id number] Number: Patient Account Number: [id number] 05-05-1966 (50 y.o. Treating RN: Macarthur Critchley Date of Birth/Sex: Female) Other Clinician: [facility], Treating Primary Care Physician: Ricky Stabs Physician/Extender: Norville Haggard, Referring Physician: Rivka Safer in Treatment: 4 Information Obtained from: Caregiver Chief Complaint Patient presents to the wound care center with open non-healing surgical wound which she's had for about 2 months Electronic Signature(s) Signed: 10/19/2015 11:19:05 AM By: Christin Fudge MD, FACS Entered By: Christin Fudge on 10/19/2015 11:19:05 Roberta Hampton ([id number]) -------------------------------------------------------------------------------- HPI Details Patient Name: Roberta Hampton, Roberta Hampton 10/19/2015 10:45 Date of Service: AM Medical Record [id number] Number: Patient Account Number: [id number] 13-Oct-1965 (50 y.o. Treating RN: Macarthur Critchley Date of Birth/Sex: Female) Other Clinician: [facility], Treating Primary Care Physician: Ricky Stabs Physician/Extender: Norville Haggard, Referring Physician: Rivka Safer in Treatment: 4 History of Present Illness Location: left ankle area Quality: Patient reports experiencing a dull pain to affected area(s). Severity: Patient states wound are getting worse. Duration: Patient has had the wound for > 3 months prior to seeking treatment at the wound center Timing: Pain in wound is Intermittent (comes and goes Context: The wound occurred when the patient had a ORIF of the left ankle in September 2016 Modifying Factors: Other treatment(s) tried include: local care as prescribed by the orthopedic surgery group Associated Signs and Symptoms: Patient reports having increase swelling. HPI  Description: 50 year old patient who has development delays and has subnormal mental acuity, comes along with her caregivers with a nonhealing wound to the left lateral ankle which has been there for about 2-3 months. She had a ORIF of the left ankle in September 2016 and during follow-up visits was noted to have drainage and cellulitis from the wound which has been nonhealing. X-rays done in the orthopedic department have been normal as per the patient's caregivers but no reports are available. Past medical history significant for development delays, major depressive disorder, seizure disorder, status post hysterectomy, status post open reduction and internal fixation of bimalleolar left ankle fracture dislocation in September 2016. 10/12/2015 -- they have not received the RTD but have Hydrofera Blue sent to them. Electronic Signature(s) Signed: 10/19/2015 11:19:11 AM By: Christin Fudge MD, FACS Entered By: Christin Fudge on 10/19/2015 11:19:11 Roberta Hampton, Roberta Hampton ([id number]) -------------------------------------------------------------------------------- Physical Exam Details Patient Name: Roberta Hampton, Roberta Hampton 10/19/2015 10:45 Date of Service: AM Medical Record [id number] Number: Patient Account Number: [id number] 10-19-1965 (50 y.o. Treating RN: Macarthur Critchley Date of Birth/Sex: Female) Other Clinician: [facility], Treating Primary Care Physician: Ricky Stabs Physician/Extender: Norville Haggard, Referring Physician: Rivka Safer in Treatment: 4 Constitutional . Pulse regular. Respirations normal and unlabored. Afebrile. . Eyes Nonicteric. Reactive to light. Ears, Nose, Mouth, and Throat Lips, teeth, and gums WNL.Marland Kitchen Moist mucosa without lesions. Neck supple and nontender. No palpable supraclavicular or cervical adenopathy. Normal sized without goiter. Respiratory WNL. No retractions.. Cardiovascular Pedal Pulses WNL. No clubbing, cyanosis or edema. Lymphatic No  adneopathy. No adenopathy. No adenopathy. Musculoskeletal Adexa without tenderness or enlargement.. Digits and nails w/o clubbing, cyanosis, infection, petechiae, ischemia, or inflammatory conditions.. Integumentary (Hair, Skin) No suspicious lesions. No crepitus or fluctuance. No peri-wound warmth or erythema. No masses.Marland Kitchen Psychiatric Judgement and insight Intact.. No evidence of depression, anxiety, or agitation.. Notes the wound is much improved and there is healthy granulation tissue at the center and the surrounding skin has come in nicely with epithelialization. It was washed out with moist  saline gauze and no debridement was required today. Electronic Signature(s) Signed: 10/19/2015 11:19:46 AM By: Christin Fudge MD, FACS Entered By: Christin Fudge on 10/19/2015 11:19:45 Roberta Hampton ([id number]) -------------------------------------------------------------------------------- Physician Orders Details Patient Name: Roberta Hampton, Roberta Hampton 10/19/2015 10:45 Date of Service: AM Medical Record [id number] Number: Patient Account Number: [id number] 1966/01/10 (50 y.o. Treating RN: Macarthur Critchley Date of Birth/Sex: Female) Other Clinician: [facility], Treating Primary Care Physician: Ricky Stabs Physician/Extender: Norville Haggard, Referring Physician: Rivka Safer in Treatment: 4 Verbal / Phone Orders: Yes Clinician: Macarthur Critchley Read Back and Verified: Yes Diagnosis Coding Wound Cleansing Wound #1 Left,Distal,Lateral Lower Leg o Cleanse wound with mild soap and water o May Shower, gently pat wound dry prior to applying new dressing. Wound #2 Left,Proximal,Lateral Lower Leg o Cleanse wound with mild soap and water o May Shower, gently pat wound dry prior to applying new dressing. Skin Barriers/Peri-Wound Care Wound #1 Left,Distal,Lateral Lower Leg o Barrier cream - zinc oxide paste around wound. Not on wound. o Moisturizing lotion - to dry area  only Wound #2 Left,Proximal,Lateral Lower Leg o Barrier cream - zinc oxide paste around wound. Not on wound. o Moisturizing lotion - to dry area only Primary Wound Dressing Wound #1 Left,Distal,Lateral Lower Leg o Hydrafera Blue - RTD dressing when at home. Wound #2 Left,Proximal,Lateral Lower Leg o Hydrafera Blue - RTD dressing when at home. Secondary Dressing Wound #1 Left,Distal,Lateral Lower Leg o Gauze and Kerlix/Conform Wound #2 Left,Proximal,Lateral Lower Leg o Gauze and Kerlix/Conform Roberta Hampton, Roberta R. ([id number]) Dressing Change Frequency Wound #1 Left,Distal,Lateral Lower Leg o Change dressing every other day. Wound #2 Left,Proximal,Lateral Lower Leg o Change dressing every other day. Follow-up Appointments Wound #1 Left,Distal,Lateral Lower Leg o Return Appointment in 1 week. Wound #2 Left,Proximal,Lateral Lower Leg o Return Appointment in 1 week. Home Health Wound #1 Left,Distal,Lateral Lower Leg o Continue Home Health Visits - *********PLEASE ORDER PTS SUPPLIES INCLUDING (RTD)******** o Home Health Nurse may visit PRN to address patientos wound care needs. o FACE TO FACE ENCOUNTER: MEDICARE and MEDICAID PATIENTS: I certify that this patient is under my care and that I had a face-to-face encounter that meets the physician face-to-face encounter requirements with this patient on this date. The encounter with the patient was in whole or in part for the following MEDICAL CONDITION: (primary reason for [place]) MEDICAL NECESSITY: I certify, that based on my findings, NURSING services are a medically necessary home health service. HOME BOUND STATUS: I certify that my clinical findings support that this patient is homebound (i.e., Due to illness or injury, pt requires aid of supportive devices such as crutches, cane, wheelchairs, walkers, the use of special transportation or the assistance of another person to leave their place of  residence. There is a normal inability to leave the home and doing so requires considerable and taxing effort. Other absences are for medical reasons / religious services and are infrequent or of short duration when for other reasons). o If current dressing causes regression in wound condition, may D/C ordered dressing product/s and apply Normal Saline Moist Dressing daily until next [place] / Other MD appointment. [place] of regression in wound condition at [phone]. o Please direct any NON-WOUND related issues/requests for orders to patient's Primary Care Physician Wound #2 Left,Proximal,Lateral Lower Leg o [place] Visits - *********PLEASE ORDER PTS SUPPLIES INCLUDING (RTD)******** o Home Health Nurse may visit PRN to address patientos wound care needs. o FACE TO FACE ENCOUNTER: MEDICARE and MEDICAID PATIENTS: I  certify that this patient is under my care and that I had a face-to-face encounter that meets the physician face-to-face encounter requirements with this patient on this date. The encounter with the patient was in whole or in part for the following MEDICAL CONDITION: (primary reason for [place]) MEDICAL NECESSITY: I certify, that based on my findings, NURSING services are a medically necessary home health service. HOME BOUND STATUS: I certify that my clinical findings support that this patient is homebound (i.e., Due to illness or injury, pt requires aid of supportive devices such as crutches, cane, wheelchairs, walkers, the use of special transportation or the assistance of another person to leave their place of residence. There is a normal inability to leave the home and doing so requires considerable and taxing effort. Other CARLYNN, KNOPE ([id number]) absences are for medical reasons / religious services and are infrequent or of short duration when for other reasons). o If current dressing causes  regression in wound condition, may D/C ordered dressing product/s and apply Normal Saline Moist Dressing daily until next [place] / Other MD appointment. [place] of regression in wound condition at [phone]. o Please direct any NON-WOUND related issues/requests for orders to patient's Primary Care Physician Electronic Signature(s) Signed: 10/19/2015 4:26:55 PM By: Christin Fudge MD, FACS Signed: 10/19/2015 4:28:54 PM By: Rebecca Eaton RN, [place]ra Entered By: Rebecca Eaton RN, Sendra on 10/19/2015 11:15:39 Roberta Hampton, Roberta Hampton ([id number]) -------------------------------------------------------------------------------- Problem List Details Patient Name: ELLE, WISLER 10/19/2015 10:45 Date of Service: AM Medical Record [id number] Number: Patient Account Number: [id number] March 08, 1966 (50 y.o. 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 Treating RN: Macarthur Critchley Date of Birth/Sex: Female) Other Clinician: [facility], Treating Primary Care Physician: Ricky Stabs Physician/Extender: Norville Haggard, Referring Physician: Rivka Safer in Treatment: 4 Subjective Chief Complaint Information obtained from Caregiver Patient presents to the wound care center with open non-healing surgical wound which she's had for about 2 months History of Present Illness (HPI) The following HPI elements were documented for the patient's wound: Location: left ankle area Quality: Patient reports experiencing a dull pain to affected area(s). Severity: Patient states wound are getting worse. Duration: Patient has had the wound for > 3 months prior to seeking treatment at the wound center Timing: Pain in wound is Intermittent (comes and goes Context: The wound occurred when the patient had a ORIF of the left ankle in September 2016 Modifying Factors: Other treatment(s) tried include: local care as prescribed by the orthopedic surgery group Associated Signs and Symptoms: Patient reports having increase swelling. 50 year old patient who has development delays and has subnormal mental acuity, comes along with her caregivers with a nonhealing wound to the left lateral ankle which has been there for about 2-3 months. She had a ORIF of the left ankle in September 2016 and during follow-up  visits was noted to have drainage and cellulitis from the wound which has been nonhealing. X-rays done in the orthopedic department have been normal as per the patient's caregivers but no reports are available. Past medical history significant for development delays, major depressive disorder, seizure disorder, status post hysterectomy, status post open reduction and internal fixation of bimalleolar left ankle fracture dislocation in September 2016. 10/12/2015 -- they have not received the RTD but have Hydrofera Blue sent to them. Roberta Hampton, Roberta Hampton  ([id number]) Objective Constitutional Pulse regular. Respirations normal and unlabored. Afebrile. Vitals Time Taken: 11:00 AM, Temperature: 97.5 F, Pulse: 77 bpm, Blood Pressure: 104/74 mmHg. Eyes Nonicteric. Reactive to light. Ears, Nose, Mouth, and Throat Lips, teeth, and gums WNL.Marland Kitchen Moist mucosa without lesions. Neck supple and nontender. No palpable supraclavicular or cervical adenopathy. Normal sized without goiter. Respiratory WNL. No retractions.. Cardiovascular Pedal Pulses WNL. No clubbing, cyanosis or edema. Lymphatic No adneopathy. No adenopathy. No adenopathy. Musculoskeletal Adexa without tenderness or enlargement.. Digits and nails w/o clubbing, cyanosis, infection, petechiae, ischemia, or inflammatory conditions.Marland Kitchen Psychiatric Judgement and insight Intact.. No evidence of depression, anxiety, or agitation.. General Notes: the wound is much improved and there is healthy granulation tissue at the center and the surrounding skin has come in nicely with epithelialization. It was washed out with moist saline gauze and no debridement was required today. Integumentary (Hair, Skin) No suspicious lesions. No crepitus or fluctuance. No peri-wound warmth or erythema. No masses.. Wound #1 status is Open. Original cause of wound was Surgical Injury. The wound is located on the Left,Distal,Lateral Lower Leg. The wound measures 0.6cm length x 1.2cm width x 0.1cm depth; 0.565cm^2 area and 0.057cm^3 volume. The wound is limited to skin breakdown. There is no tunneling or undermining noted. There is a medium amount of serosanguineous drainage noted. The wound margin is flat and intact. There is large (67-100%) red, pink granulation within the wound bed. There is no necrotic tissue within the wound bed. The periwound skin appearance did not exhibit: Localized Edema, Moist. Periwound temperature was noted as No Abnormality. The periwound has tenderness on palpation. Roberta Hampton, Roberta Hampton  ([id number]) Wound #2 status is Open. Original cause of wound was Trauma. The wound is located on the Left,Proximal,Lateral Lower Leg. The wound measures 0.8cm length x 0.4cm width x 0.1cm depth; 0.251cm^2 area and 0.025cm^3 volume. There is no tunneling or undermining noted. There is a medium amount of serosanguineous drainage noted. The wound margin is flat and intact. There is large (67-100%) red granulation within the wound bed. There is no necrotic tissue within the wound bed. 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 Wound #2 Left,Proximal,Lateral Lower Leg: Cleanse wound with mild soap and water May Shower, gently pat wound dry prior to applying new dressing. Skin Barriers/Peri-Wound Care: Wound #1 Left,Distal,Lateral Lower Leg: Barrier cream - zinc oxide paste around wound. Not on wound. Moisturizing lotion - to dry area only Wound #2 Left,Proximal,Lateral Lower Leg: Barrier cream - zinc oxide paste around wound. Not on wound. Moisturizing lotion - to dry area only Primary Wound Dressing: Wound #1 Left,Distal,Lateral Lower Leg: Hydrafera Blue - RTD dressing when at home. Wound #2 Left,Proximal,Lateral Lower Leg: Hydrafera Blue -  RTD dressing when at home. Secondary Dressing: Wound #1 Left,Distal,Lateral Lower Leg: Gauze and Kerlix/Conform Wound #2 Left,Proximal,Lateral Lower Leg: Gauze and Kerlix/Conform Dressing Change Frequency: Roberta Hampton, Roberta Hampton. ([id number]) Wound #1 Left,Distal,Lateral Lower Leg: Change dressing every other day. Wound #2 Left,Proximal,Lateral Lower  Leg: Change dressing every other day. Follow-up Appointments: Wound #1 Left,Distal,Lateral Lower Leg: Return Appointment in 1 week. Wound #2 Left,Proximal,Lateral Lower Leg: Return Appointment in 1 week. Home Health: Wound #1 Left,Distal,Lateral Lower Leg: Continue Home Health Visits - *********PLEASE ORDER PTS SUPPLIES INCLUDING (RTD)******** Home Health Nurse may visit PRN to address patient s wound care needs. FACE TO FACE ENCOUNTER: MEDICARE and MEDICAID PATIENTS: I certify that this patient is under my care and that I had a face-to-face encounter that meets the physician face-to-face encounter requirements with this patient on this date. The encounter with the patient was in whole or in part for the following MEDICAL CONDITION: (primary reason for [place]) MEDICAL NECESSITY: I certify, that based on my findings, NURSING services are a medically necessary home health service. HOME BOUND STATUS: I certify that my clinical findings support that this patient is homebound (i.e., Due to illness or injury, pt requires aid of supportive devices such as crutches, cane, wheelchairs, walkers, the use of special transportation or the assistance of another person to leave their place of residence. There is a normal inability to leave the home and doing so requires considerable and taxing effort. Other absences are for medical reasons / religious services and are infrequent or of short duration when for other reasons). If current dressing causes regression in wound condition, may D/C ordered dressing product/s and apply Normal Saline Moist Dressing daily until next [place] / Other MD appointment. [place] of regression in wound condition at [phone]. Please direct any NON-WOUND related issues/requests for orders to patient's Primary Care Physician Wound #2 Left,Proximal,Lateral Lower Leg: [place] Visits - *********PLEASE ORDER PTS SUPPLIES  INCLUDING (RTD)******** Home Health Nurse may visit PRN to address patient s wound care needs. FACE TO FACE ENCOUNTER: MEDICARE and MEDICAID PATIENTS: I certify that this patient is under my care and that I had a face-to-face encounter that meets the physician face-to-face encounter requirements with this patient on this date. The encounter with the patient was in whole or in part for the following MEDICAL CONDITION: (primary reason for [place]) MEDICAL NECESSITY: I certify, that based on my findings, NURSING services are a medically necessary home health service. HOME BOUND STATUS: I certify that my clinical findings support that this patient is homebound (i.e., Due to illness or injury, pt requires aid of supportive devices such as crutches, cane, wheelchairs, walkers, the use of special transportation or the assistance of another person to leave their place of residence. There is a normal inability to leave the home and doing so requires considerable and taxing effort. Other absences are for medical reasons / religious services and are infrequent or of short duration when for other reasons). If current dressing causes regression in wound condition, may D/C ordered dressing product/s and apply Normal Saline Moist Dressing daily until next [place] / Other MD appointment. [place] of regression in wound condition at [phone]. Please direct any NON-WOUND related issues/requests for orders to patient's Primary Care Physician Roberta Hampton, RADEMACHER. ([id number]) I have recommended we continue with Hydrofera Blue and a light compression and see her back next week. Electronic Signature(s) Signed: 10/19/2015 11:20:12 AM By: Christin Fudge MD, FACS Entered  By: Christin Fudge on 10/19/2015 11:20:12 Roberta Hampton ([id number]) -------------------------------------------------------------------------------- SuperBill Details Patient Name: Roberta Hampton. Date  of Service: 10/19/2015 Medical Record Number: [id number] Patient Account Number: [id number] Date of Birth/Sex: [birth year] (50 y.o. 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 Facility Procedures CPT4 Code: [id number] Description: 99214 - WOUND CARE VISIT-LEV 4 EST PT Modifier: Quantity: 1 Physician Procedures CPT4: Description Modifier Quantity Code [id number] - WC PHYS LEVEL 3 - EST PT 1 ICD-10 Description Diagnosis T81.31XA Disruption of external operation (surgical) wound, not elsewhere classified, initial encounter F81.9 Developmental disorder of  scholastic skills, unspecified L97.322 Non-pressure chronic ulcer of left ankle with fat layer exposed Electronic Signature(s) Signed: 10/19/2015 4:26:55 PM By: Christin Fudge MD, FACS Signed: 10/19/2015 4:28:54 PM By: Rebecca Eaton RN, Sendra Previous Signature: 10/19/2015 11:20:25 AM Version By: Christin Fudge MD, FACS Entered By: Rebecca Eaton RN, Sendra on 10/19/2015 13:33:37

## 2015-10-26 ENCOUNTER — Encounter: Payer: Medicare Other | Admitting: Surgery

## 2015-10-26 DIAGNOSIS — L97322 Non-pressure chronic ulcer of left ankle with fat layer exposed: Secondary | ICD-10-CM | POA: Diagnosis not present

## 2015-10-27 NOTE — Progress Notes (Signed)
VALETTA, KINGSLAND ([id number]) Visit Report for 10/26/2015 Arrival Information Details Patient Name: Roberta Hampton, Roberta Hampton 10/26/2015 10:15 Date of Service: AM Medical Record [id number] Number: Patient Account Number: [id number] September 02, 1966 (50 y.o. Treating RN: Macarthur Critchley Date of Birth/Sex: Female) Other Clinician: [facility], Treating Primary Care Physician: Ricky Stabs Physician/Extender: Norville Haggard, Referring Physician: Rivka Safer in Treatment: 5 Visit Information History Since Last Visit All ordered tests and consults were completed: No Patient Arrived: Ambulatory Added or deleted any medications: No Arrival Time: 10:12 Any new allergies or adverse reactions: No Accompanied By: caregiver Had a fall or experienced change in No Transfer Assistance: None activities of daily living that may affect Patient Identification Verified: Yes risk of falls: Secondary Verification Process Yes Signs or symptoms of abuse/neglect since last No Completed: visito Patient Requires Transmission-Based No Hospitalized since last visit: No Precautions: Has Dressing in Place as Prescribed: Yes Patient Has Alerts: No Pain Present Now: No Electronic Signature(s) Signed: 10/26/2015 4:54:11 PM By: Rebecca Eaton, RN, Sendra Entered By: Rebecca Eaton RN, Sendra on 10/26/2015 10:12:50 [name] ([id number]) -------------------------------------------------------------------------------- Clinic Level of Care Assessment Details Patient Name: Roberta, Hampton 10/26/2015 10:15 Date of Service: AM Medical Record [id number] Number: Patient Account Number: [id number] 26-Sep-1965 (50 y.o. Treating RN: Macarthur Critchley Date of Birth/Sex: Female) Other Clinician: [facility], Treating Primary Care Physician: Ricky Stabs Physician/Extender: Norville Haggard, Referring Physician: Rivka Safer in Treatment: 5 Clinic Level of Care Assessment Items TOOL 4 Quantity Score X  - Use when only an EandM is performed on FOLLOW-UP visit 1 0 ASSESSMENTS - Nursing Assessment / Reassessment X - Reassessment of Co-morbidities (includes updates in patient status) 1 10 X - Reassessment of Adherence to Treatment Plan 1 5 ASSESSMENTS - Wound and Skin Assessment / Reassessment []  - Simple Wound Assessment / Reassessment - one wound 0 X - Complex Wound Assessment / Reassessment - multiple wounds 2 5 []  - Dermatologic / Skin Assessment (not related to wound area) 0 ASSESSMENTS - Focused Assessment []  - Circumferential Edema Measurements - multi extremities 0 []  - Nutritional Assessment / Counseling / Intervention 0 X - Lower Extremity Assessment (monofilament, tuning fork, pulses) 1 5 []  - Peripheral Arterial Disease Assessment (using hand held doppler) 0 ASSESSMENTS - Ostomy and/or Continence Assessment and Care []  - Incontinence Assessment and Management 0 []  - Ostomy Care Assessment and Management (repouching, etc.) 0 PROCESS - Coordination of Care X - Simple Patient / Family Education for ongoing care 1 15 []  - Complex (extensive) Patient / Family Education for ongoing care 0 X - Staff obtains Consents, Records, Test Results / Process Orders 1 10 MARLEEN, BRINGHURST R. ([id number]) X - Staff telephones HHA, Nursing Homes / Clarify orders / etc 1 10 []  - Routine Transfer to another Facility (non-emergent condition) 0 []  - Routine Hospital Admission (non-emergent condition) 0 []  - New Admissions / Biomedical engineer / Ordering NPWT, Apligraf, etc. 0 []  - Emergency Hospital Admission (emergent condition) 0 X - Simple Discharge Coordination 1 10 []  - Complex (extensive) Discharge Coordination 0 PROCESS - Special Needs []  - Pediatric / Minor Patient Management 0 []  - Isolation Patient Management 0 []  - Hearing / Language / Visual special needs 0 []  - Assessment of Community assistance (transportation, D/C planning, etc.) 0 []  - Additional assistance / Altered mentation  0 []  - Support Surface(s) Assessment (bed, cushion, seat, etc.) 0 INTERVENTIONS - Wound Cleansing / Measurement []  - Simple Wound Cleansing - one wound 0 X - Complex Wound Cleansing - multiple wounds 2 5 X -  Wound Imaging (photographs - any number of wounds) 1 5 []  - Wound Tracing (instead of photographs) 0 X - Simple Wound Measurement - one wound 1 5 X - Complex Wound Measurement - multiple wounds 2 5 INTERVENTIONS - Wound Dressings X - Small Wound Dressing one or multiple wounds 2 10 []  - Medium Wound Dressing one or multiple wounds 0 []  - Large Wound Dressing one or multiple wounds 0 X - Application of Medications - topical 1 5 Higuchi, Yared R. ([id number]) []  - Application of Medications - injection 0 INTERVENTIONS - Miscellaneous []  - External ear exam 0 []  - Specimen Collection (cultures, biopsies, blood, body fluids, etc.) 0 []  - Specimen(s) / Culture(s) sent or taken to Lab for analysis 0 []  - Patient Transfer (multiple staff / Civil Service fast streamer / Similar devices) 0 []  - Simple Staple / Suture removal (25 or less) 0 []  - Complex Staple / Suture removal (26 or more) 0 []  - Hypo / Hyperglycemic Management (close monitor of Blood Glucose) 0 []  - Ankle / Brachial Index (ABI) - do not check if billed separately 0 X - Vital Signs 1 5 Has the patient been seen at the hospital within the last three years: Yes Total Score: 135 Level Of Care: New/Established - Level 4 Electronic Signature(s) Signed: 10/26/2015 4:54:11 PM By: Rebecca Eaton, RN, Sendra Entered By: Rebecca Eaton RN, Sendra on 10/26/2015 10:49:11 [name] ([id number]) -------------------------------------------------------------------------------- Encounter Discharge Information Details Patient Name: Roberta, Hampton 10/26/2015 10:15 Date of Service: AM Medical Record [id number] Number: Patient Account Number: [id number] 1965-10-12 (50 y.o. Treating RN: Macarthur Critchley Date of Birth/Sex: Female) Other  Clinician: [facility], Treating Primary Care Physician: Ricky Stabs Physician/Extender: Norville Haggard, Referring Physician: Rivka Safer in Treatment: 5 Encounter Discharge Information Items Facility Notification Discharge Pain Level: 0 Facility Type: Home Health Discharge Condition: Stable Orders Sent: Yes Ambulatory Status: Ambulatory Other (Note Discharge Destination: Required) Transportation: Private Auto Accompanied By: caregiver Schedule Follow-up Appointment: Yes Medication Reconciliation completed and provided to Patient/Care Yes Niquita Digioia: Provided on Clinical Summary of Care: 10/26/2015 Form Type Recipient Paper Patient CC Electronic Signature(s) Signed: 10/26/2015 4:54:11 PM By: Rebecca Eaton RN, Sendra Previous Signature: 10/26/2015 10:35:23 AM Version By: Ruthine Dose Entered By: Rebecca Eaton RN, Sendra on 10/26/2015 10:36:56 [name] ([id number]) -------------------------------------------------------------------------------- Lower Extremity Assessment Details Patient Name: VANNAH, BATTIE 10/26/2015 10:15 Date of Service: AM Medical Record [id number] Number: Patient Account Number: [id number] 07-27-1966 (50 y.o. Treating RN: Macarthur Critchley Date of Birth/Sex: Female) Other Clinician: [facility], Treating Primary Care Physician: Ricky Stabs Physician/Extender: Norville Haggard, Referring Physician: Rivka Safer in Treatment: 5 Edema Assessment Assessed: [Left: No] [Right: No] Edema: [Left: N] [Right: o] Vascular Assessment Pulses: Posterior Tibial Dorsalis Pedis Palpable: [Left:Yes] Extremity colors, hair growth, and conditions: Extremity Color: [Left:Normal] Hair Growth on Extremity: [Left:Yes] Temperature of Extremity: [Left:Warm] Capillary Refill: [Left:< 3 seconds] Toe Nail Assessment Left: Right: Thick: No Discolored: No Deformed: No Improper Length and Hygiene: No Electronic Signature(s) Signed: 10/26/2015  4:54:11 PM By: Rebecca Eaton, RN, Sendra Entered By: Rebecca Eaton RN, Sendra on 10/26/2015 10:23:03 [name] ([id number]) -------------------------------------------------------------------------------- Pain Assessment Details Patient Name: KEILIANYS, VANATTA 10/26/2015 10:15 Date of Service: AM Medical Record [id number] Number: Patient Account Number: [id number] 1965/11/04 (50 y.o. Treating RN: Macarthur Critchley Date of Birth/Sex: Female) Other Clinician: [facility], Treating Primary Care Physician: Ricky Stabs Physician/Extender: Norville Haggard, Referring Physician: Rivka Safer in Treatment: 5 Active Problems Location of Pain Severity and Description of Pain Patient Has Paino No Site Locations Rate the pain. Current Pain Level: 0  Pain Management and Medication Current Pain Management: Electronic Signature(s) Signed: 10/26/2015 4:54:11 PM By: Rebecca Eaton, RN, Sendra Entered By: Rebecca Eaton RN, Sendra on 10/26/2015 10:12:57 SAMANTAH, HANSON ([id number]) -------------------------------------------------------------------------------- Patient/Caregiver Education Details Patient Name: DELMARIE, TRABER 10/26/2015 10:15 Date of Service: AM Medical Record [id number] Number: Patient Account Number: [id number] 08/24/1966 (50 y.o. Treating RN: Macarthur Critchley Date of Birth/Gender: Female) Other Clinician: [facility], Treating Primary Care Physician: Ricky Stabs Physician/Extender: Wynn Banker in Treatment: 5 Referring Physician: [facility] Education Assessment Education Provided To: Patient and Caregiver Education Topics Provided Wound/Skin Impairment: Handouts: Caring for Your Ulcer, Skin Care Do's and Dont's Methods: Explain/Verbal Responses: State content correctly Electronic Signature(s) Signed: 10/26/2015 4:54:11 PM By: Rebecca Eaton, RN, Sendra Entered By: Rebecca Eaton RN, Sendra on 10/26/2015 10:37:10 [name]  ([id number]) -------------------------------------------------------------------------------- Wound Assessment Details Patient Name: ISEBELLA, KOSER 10/26/2015 10:15 Date of Service: AM Medical Record [id number] Number: Patient Account Number: [id number] 1965-09-19 (50 y.o. Treating RN: Macarthur Critchley Date of Birth/Sex: Female) Other Clinician: [facility], Treating Primary Care Physician: Ricky Stabs Physician/Extender: Norville Haggard, Referring Physician: Rivka Safer in Treatment: 5 Wound Status Wound Number: 1 Primary Etiology: Trauma, Other Wound Location: Left Lower Leg - Lateral, Distal Wound Status: Open Wounding Event: Surgical Injury Comorbid History: Seizure Disorder Date Acquired: 05/07/2015 Weeks Of Treatment: 5 Clustered Wound: No Photos Photo Uploaded By: Rebecca Eaton RN, Roslynn Amble on 10/26/2015 16:49:46 Wound Measurements Length: (cm) 0.3 Width: (cm) 0.5 Depth: (cm) 0.1 Area: (cm) 0.118 Volume: (cm) 0.012 % Reduction in Area: 96.8% % Reduction in Volume: 96.7% Epithelialization: Small (1-33%) Tunneling: No Wound Description Classification: Partial Thickness Wound Margin: Flat and Intact Exudate Amount: Small Exudate Type: Serosanguineous Exudate Color: red, brown Foul Odor After Cleansing: No Wound Bed Granulation Amount: Large (67-100%) Exposed Structure YEILI, OLDHAM R. ([id number]) Granulation Quality: Red, Pink, Hyper-granulation Fascia Exposed: No Necrotic Amount: None Present (0%) Fat Layer Exposed: No Tendon Exposed: No Muscle Exposed: No Joint Exposed: No Bone Exposed: No Limited to Skin Breakdown Periwound Skin Texture Texture Color No Abnormalities Noted: No No Abnormalities Noted: No Localized Edema: No Temperature / Pain Moisture Temperature: No Abnormality No Abnormalities Noted: No Tenderness on Palpation: Yes Moist: No Wound Preparation Ulcer Cleansing: Rinsed/Irrigated with Saline Topical Anesthetic  Applied: Other: lidocaine 4%, Treatment Notes Wound #1 (Left, Distal, Lateral Lower Leg) 1. Cleansed with: Clean wound with Normal Saline 2. Anesthetic Topical Lidocaine 4% cream to wound bed prior to debridement 3. Peri-wound Care: Barrier cream 4. Dressing Applied: Hydrafera Blue 5. Secondary Dressing Applied Gauze and Kerlix/Conform Electronic Signature(s) Signed: 10/26/2015 4:54:11 PM By: Rebecca Eaton, RN, Sendra Entered By: Rebecca Eaton RN, Sendra on 10/26/2015 10:20:34 LEHLANI, MONTER ([id number]) -------------------------------------------------------------------------------- Wound Assessment Details Patient Name: AURIYAH, LUSH 10/26/2015 10:15 Date of Service: AM Medical Record [id number] Number: Patient Account Number: [id number] 05-04-1966 (50 y.o. Treating RN: Macarthur Critchley Date of Birth/Sex: Female) Other Clinician: [facility], Treating Primary Care Physician: Ricky Stabs Physician/Extender: Norville Haggard, Referring Physician: Rivka Safer in Treatment: 5 Wound Status Wound Number: 2 Primary Etiology: Trauma, Other Wound Location: Left, Proximal, Lateral Lower Wound Status: Open Leg Wounding Event: Trauma Date Acquired: 10/14/2015 Weeks Of Treatment: 1 Clustered Wound: No Photos Photo Uploaded By: Rebecca Eaton RN, Roslynn Amble on 10/26/2015 16:49:47 Wound Measurements Length: (cm) 0.2 Width: (cm) 0.2 Depth: (cm) 0.1 Area: (cm) 0.031 Volume: (cm) 0.003 % Reduction in Area: 87.6% % Reduction in Volume: 88% Wound Description Classification: Partial Thickness Periwound Skin Texture Texture Color No Abnormalities Noted: No No Abnormalities Noted: No Moisture No Abnormalities Noted: No Espiritu, Brecklynn R. (  [id number]) Treatment Notes Wound #2 (Left, Proximal, Lateral Lower Leg) 1. Cleansed with: Clean wound with Normal Saline 2. Anesthetic Topical Lidocaine 4% cream to wound bed prior to debridement 3. Peri-wound Care: Barrier cream 4.  Dressing Applied: Hydrafera Blue 5. Secondary Dressing Applied Gauze and Kerlix/Conform Electronic Signature(s) Signed: 10/26/2015 4:54:11 PM By: Rebecca Eaton, RN, Sendra Entered By: Rebecca Eaton RN, Sendra on 10/26/2015 10:20:50 JAKEA, WILLETT ([id number]) -------------------------------------------------------------------------------- Vitals Details Patient Name: KATRICE, BUDAY 10/26/2015 10:15 Date of Service: AM Medical Record [id number] Number: Patient Account Number: [id number] [birth year] (50 y.o. Treating RN: Macarthur Critchley Date of Birth/Sex: Female) Other Clinician: [facility], Treating Primary Care Physician: Ricky Stabs Physician/Extender: Norville Haggard, Referring Physician: Rivka Safer in Treatment: 5 Vital Signs Time Taken: 10:15 Temperature (F): 98.2 Pulse (bpm): 79 Blood Pressure (mmHg): 121/67 Reference Range: 80 - 120 mg / dl Electronic Signature(s) Signed: 10/26/2015 4:54:11 PM By: Rebecca Eaton RN, Sendra Entered By: Rebecca Eaton RN, Sendra on 10/26/2015 10:15:32

## 2015-10-27 NOTE — Progress Notes (Signed)
[name], [name] ([id number]) Visit Report for 10/26/2015 Chief Complaint Document Details Patient Name: Roberta Hampton, Roberta Hampton 10/26/2015 10:15 Date of Service: AM Medical Record [id number] Number: Patient Account Number: [id number] February 13, 1966 (50 y.o. Treating RN: Macarthur Critchley Date of Birth/Sex: Female) Other Clinician: [facility], Treating Primary Care Physician: Ricky Stabs Physician/Extender: Norville Haggard, Referring Physician: Rivka Safer in Treatment: 5 Information Obtained from: Caregiver Chief Complaint Patient presents to the wound care center with open non-healing surgical wound which she's had for about 2 months Electronic Signature(s) Signed: 10/26/2015 10:31:04 AM By: Christin Fudge MD, FACS Entered By: Christin Fudge on 10/26/2015 10:31:04 [name] ([id number]) -------------------------------------------------------------------------------- HPI Details Patient Name: Roberta Hampton, Roberta Hampton 10/26/2015 10:15 Date of Service: AM Medical Record [id number] Number: Patient Account Number: [id number] [birth year] (49 y.o. Treating RN: Macarthur Critchley Date of Birth/Sex: Female) Other Clinician: [facility], Treating Primary Care Physician: Ricky Stabs Physician/Extender: Norville Haggard, Referring Physician: Rivka Safer in Treatment: 5 History of Present Illness Location: left ankle area Quality: Patient reports experiencing a dull pain to affected area(s). Severity: Patient states wound are getting worse. Duration: Patient has had the wound for > 3 months prior to seeking treatment at the wound center Timing: Pain in wound is Intermittent (comes and goes Context: The wound occurred when the patient had a ORIF of the left ankle in September 2016 Modifying Factors: Other treatment(s) tried include: local care as prescribed by the orthopedic surgery group Associated Signs and Symptoms: Patient reports having increase swelling. HPI  Description: 50 year old patient who has development delays and has subnormal mental acuity, comes along with her caregivers with a nonhealing wound to the left lateral ankle which has been there for about 2-3 months. She had a ORIF of the left ankle in September 2016 and during follow-up visits was noted to have drainage and cellulitis from the wound which has been nonhealing. X-rays done in the orthopedic department have been normal as per the patient's caregivers but no reports are available. Past medical history significant for development delays, major depressive disorder, seizure disorder, status post hysterectomy, status post open reduction and internal fixation of bimalleolar left ankle fracture dislocation in September 2016. 10/12/2015 -- they have not received the RTD but have Hydrofera Blue sent to them. Electronic Signature(s) Signed: 10/26/2015 10:31:10 AM By: Christin Fudge MD, FACS Entered By: Christin Fudge on 10/26/2015 10:31:10 Roberta Hampton, Roberta Hampton ([id number]) -------------------------------------------------------------------------------- Physical Exam Details Patient Name: Roberta Hampton, Roberta Hampton 10/26/2015 10:15 Date of Service: AM Medical Record [id number] Number: Patient Account Number: [id number] [birth year] (50 y.o. Treating RN: Macarthur Critchley Date of Birth/Sex: Female) Other Clinician: [facility], Treating Primary Care Physician: Ricky Stabs Physician/Extender: Norville Haggard, Referring Physician: Rivka Safer in Treatment: 5 Constitutional . Pulse regular. Respirations normal and unlabored. Afebrile. . Eyes Nonicteric. Reactive to light. Ears, Nose, Mouth, and Throat Lips, teeth, and gums WNL.Marland Kitchen Moist mucosa without lesions. Neck supple and nontender. No palpable supraclavicular or cervical adenopathy. Normal sized without goiter. Respiratory WNL. No retractions.. Cardiovascular Pedal Pulses WNL. No clubbing, cyanosis or edema. Lymphatic No  adneopathy. No adenopathy. No adenopathy. Musculoskeletal Adexa without tenderness or enlargement.. Digits and nails w/o clubbing, cyanosis, infection, petechiae, ischemia, or inflammatory conditions.. Integumentary (Hair, Skin) No suspicious lesions. No crepitus or fluctuance. No peri-wound warmth or erythema. No masses.Marland Kitchen Psychiatric Judgement and insight Intact.. No evidence of depression, anxiety, or agitation.. Notes no debridement was required today and the minimal debris is washed out nicely with moist saline gauze. She has got healthy granulation tissue. Electronic Signature(s) Signed: 10/26/2015 10:31:53 AM By:  Christin Fudge MD, FACS Entered By: Christin Fudge on 10/26/2015 10:31:53 Roberta Hampton, Roberta Hampton ([id number]) -------------------------------------------------------------------------------- Physician Orders Details Patient Name: Roberta Hampton, Roberta Hampton 10/26/2015 10:15 Date of Service: AM Medical Record [id number] Number: Patient Account Number: [id number] 1966/09/04 (50 y.o. Treating RN: Macarthur Critchley Date of Birth/Sex: Female) Other Clinician: [facility], Treating Primary Care Physician: Ricky Stabs Physician/Extender: Norville Haggard, Referring Physician: Rivka Safer in Treatment: 5 Verbal / Phone Orders: Yes Clinician: Macarthur Critchley Read Back and Verified: Yes Diagnosis Coding Wound Cleansing Wound #1 Left,Distal,Lateral Lower Leg o Cleanse wound with mild soap and water o May Shower, gently pat wound dry prior to applying new dressing. Wound #2 Left,Proximal,Lateral Lower Leg o Cleanse wound with mild soap and water o May Shower, gently pat wound dry prior to applying new dressing. Skin Barriers/Peri-Wound Care Wound #1 Left,Distal,Lateral Lower Leg o Barrier cream - zinc oxide paste around wound. Not on wound. o Moisturizing lotion - to dry area only Wound #2 Left,Proximal,Lateral Lower Leg o Barrier cream - zinc oxide paste  around wound. Not on wound. o Moisturizing lotion - to dry area only Primary Wound Dressing Wound #1 Left,Distal,Lateral Lower Leg o Hydrafera Blue - RTD dressing when at home. Wound #2 Left,Proximal,Lateral Lower Leg o Hydrafera Blue - RTD dressing when at home. Secondary Dressing Wound #1 Left,Distal,Lateral Lower Leg o Gauze and Kerlix/Conform Wound #2 Left,Proximal,Lateral Lower Leg o Gauze and Kerlix/Conform Roberta Hampton, Roberta R. ([id number]) Dressing Change Frequency Wound #1 Left,Distal,Lateral Lower Leg o Change dressing every other day. Wound #2 Left,Proximal,Lateral Lower Leg o Change dressing every other day. Follow-up Appointments Wound #1 Left,Distal,Lateral Lower Leg o Return Appointment in 1 week. Wound #2 Left,Proximal,Lateral Lower Leg o Return Appointment in 1 week. Home Health Wound #1 Left,Distal,Lateral Lower Leg o Continue Home Health Visits - *********PLEASE ORDER PTS SUPPLIES INCLUDING (RTD)******** o Home Health Nurse may visit PRN to address patientos wound care needs. o FACE TO FACE ENCOUNTER: MEDICARE and MEDICAID PATIENTS: I certify that this patient is under my care and that I had a face-to-face encounter that meets the physician face-to-face encounter requirements with this patient on this date. The encounter with the patient was in whole or in part for the following MEDICAL CONDITION: (primary reason for [place]) MEDICAL NECESSITY: I certify, that based on my findings, NURSING services are a medically necessary home health service. HOME BOUND STATUS: I certify that my clinical findings support that this patient is homebound (i.e., Due to illness or injury, pt requires aid of supportive devices such as crutches, cane, wheelchairs, walkers, the use of special transportation or the assistance of another person to leave their place of residence. There is a normal inability to leave the home and doing so requires considerable  and taxing effort. Other absences are for medical reasons / religious services and are infrequent or of short duration when for other reasons). o If current dressing causes regression in wound condition, may D/C ordered dressing product/s and apply Normal Saline Moist Dressing daily until next [place] / Other MD appointment. [place] of regression in wound condition at [phone]. o Please direct any NON-WOUND related issues/requests for orders to patient's Primary Care Physician Wound #2 Left,Proximal,Lateral Lower Leg o [place] Visits - *********PLEASE ORDER PTS SUPPLIES INCLUDING (RTD)******** o Home Health Nurse may visit PRN to address patientos wound care needs. o FACE TO FACE ENCOUNTER: MEDICARE and MEDICAID PATIENTS: I certify that this patient is under my care and that I had a face-to-face encounter  that meets the physician face-to-face encounter requirements with this patient on this date. The encounter with the patient was in whole or in part for the following MEDICAL CONDITION: (primary reason for [place]) MEDICAL NECESSITY: I certify, that based on my findings, NURSING services are a medically necessary home health service. HOME BOUND STATUS: I certify that my clinical findings support that this patient is homebound (i.e., Due to illness or injury, pt requires aid of supportive devices such as crutches, cane, wheelchairs, walkers, the use of special transportation or the assistance of another person to leave their place of residence. There is a normal inability to leave the home and doing so requires considerable and taxing effort. Other Roberta Hampton, Roberta Hampton ([id number]) absences are for medical reasons / religious services and are infrequent or of short duration when for other reasons). o If current dressing causes regression in wound condition, may D/C ordered dressing product/s and apply Normal Saline Moist  Dressing daily until next [place] / Other MD appointment. [place] of regression in wound condition at [phone]. o Please direct any NON-WOUND related issues/requests for orders to patient's Primary Care Physician Electronic Signature(s) Signed: 10/26/2015 2:58:24 PM By: Christin Fudge MD, FACS Signed: 10/26/2015 4:54:11 PM By: Rebecca Eaton RN, [place]ra Entered By: Rebecca Eaton RN, Sendra on 10/26/2015 10:29:36 Roberta Hampton, Roberta Hampton ([id number]) -------------------------------------------------------------------------------- Problem List Details Patient Name: BEMNET, OKERSTROM 10/26/2015 10:15 Date of Service: AM Medical Record [id number] Number: Patient Account Number: [id number] 11/30/1965 (50 y.o. 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 Treating RN: Macarthur Critchley Date of Birth/Sex: Female) Other Clinician: [facility], Treating Primary Care Physician:  Ricky Stabs Physician/Extender: Norville Haggard, Referring Physician: Rivka Safer in Treatment: 5 Subjective Chief Complaint Information obtained from Caregiver Patient presents to the wound care center with open non-healing surgical wound which she's had for about 2 months History of Present Illness (HPI) The following HPI elements were documented for the patient's wound: Location: left ankle area Quality: Patient reports experiencing a dull pain to affected area(s). Severity: Patient states wound are getting worse. Duration: Patient has had the wound for > 3 months prior to seeking treatment at the wound center Timing: Pain in wound is Intermittent (comes and goes Context: The wound occurred when the patient had a ORIF of the left ankle in September 2016 Modifying Factors: Other treatment(s) tried include: local care as prescribed by the orthopedic surgery group Associated Signs and Symptoms: Patient reports having increase swelling. 50 year old patient who has development delays and has subnormal mental acuity, comes along with her caregivers with a nonhealing wound to the left lateral ankle which has been there for about 2-3 months. She had a ORIF of the left ankle in September 2016 and during follow-up visits was noted to have drainage and cellulitis from the wound which has been nonhealing.  X-rays done in the orthopedic department have been normal as per the patient's caregivers but no reports are available. Past medical history significant for development delays, major depressive disorder, seizure disorder, status post hysterectomy, status post open reduction and internal fixation of bimalleolar left ankle fracture dislocation in September 2016. 10/12/2015 -- they have not received the RTD but have Hydrofera Blue sent to them. Roberta Hampton, Roberta Hampton ([id number]) Objective Constitutional Pulse regular. Respirations normal and unlabored. Afebrile. Vitals Time Taken:  10:15 AM, Temperature: 98.2 F, Pulse: 79 bpm, Blood Pressure: 121/67 mmHg. Eyes Nonicteric. Reactive to light. Ears, Nose, Mouth, and Throat Lips, teeth, and gums WNL.Marland Kitchen Moist mucosa without lesions. Neck supple and nontender. No palpable supraclavicular or cervical adenopathy. Normal sized without goiter. Respiratory WNL. No retractions.. Cardiovascular Pedal Pulses WNL. No clubbing, cyanosis or edema. Lymphatic No adneopathy. No adenopathy. No adenopathy. Musculoskeletal Adexa without tenderness or enlargement.. Digits and nails w/o clubbing, cyanosis, infection, petechiae, ischemia, or inflammatory conditions.Marland Kitchen Psychiatric Judgement and insight Intact.. No evidence of depression, anxiety, or agitation.. General Notes: no debridement was required today and the minimal debris is washed out nicely with moist saline gauze. She has got healthy granulation tissue. Integumentary (Hair, Skin) No suspicious lesions. No crepitus or fluctuance. No peri-wound warmth or erythema. No masses.. Wound #1 status is Open. Original cause of wound was Surgical Injury. The wound is located on the Left,Distal,Lateral Lower Leg. The wound measures 0.3cm length x 0.5cm width x 0.1cm depth; 0.118cm^2 area and 0.012cm^3 volume. The wound is limited to skin breakdown. There is no tunneling noted. There is a small amount of serosanguineous drainage noted. The wound margin is flat and intact. There is large (67- 100%) red, pink granulation within the wound bed. There is no necrotic tissue within the wound bed. The periwound skin appearance did not exhibit: Localized Edema, Moist. Periwound temperature was noted as No Abnormality. The periwound has tenderness on palpation. Wound #2 status is Open. Original cause of wound was Trauma. The wound is located on the [place]. ([id number]) Left,Proximal,Lateral Lower Leg. The wound measures 0.2cm length x 0.2cm width x 0.1cm depth; 0.031cm^2 area and 0.003cm^3  volume. 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 Wound #2 Left,Proximal,Lateral Lower Leg: Cleanse wound with mild soap and water May Shower, gently pat wound dry prior to applying new dressing. Skin Barriers/Peri-Wound Care: Wound #1 Left,Distal,Lateral Lower Leg: Barrier cream - zinc oxide paste around wound. Not on wound. Moisturizing lotion - to dry area only Wound #2 Left,Proximal,Lateral Lower Leg: Barrier cream - zinc oxide paste around wound. Not on wound. Moisturizing lotion - to dry area only Primary Wound Dressing: Wound #1 Left,Distal,Lateral Lower Leg: Hydrafera Blue - RTD dressing when at home. Wound #2 Left,Proximal,Lateral Lower Leg: Hydrafera Blue - RTD dressing when at home. Secondary Dressing: Wound #1 Left,Distal,Lateral Lower Leg: Gauze and Kerlix/Conform Wound #2 Left,Proximal,Lateral Lower Leg: Gauze and Kerlix/Conform Dressing Change Frequency: Wound #1 Left,Distal,Lateral Lower Leg: Change dressing every other day. Wound #2 Left,Proximal,Lateral Lower Leg: Roberta Hampton, Roberta Hampton R. ([id number]) Change dressing every other day. Follow-up Appointments: Wound #1 Left,Distal,Lateral Lower Leg: Return Appointment in 1 week. Wound #2 Left,Proximal,Lateral Lower Leg: Return Appointment in 1 week.  Home Health: Wound #1 Left,Distal,Lateral Lower Leg: Continue Home Health Visits - *********PLEASE ORDER PTS SUPPLIES INCLUDING (RTD)******** Home Health Nurse may visit PRN to address patient s wound care needs. FACE TO FACE ENCOUNTER: MEDICARE and MEDICAID PATIENTS: I  certify that this patient is under my care and that I had a face-to-face encounter that meets the physician face-to-face encounter requirements with this patient on this date. The encounter with the patient was in whole or in part for the following MEDICAL CONDITION: (primary reason for [place]) MEDICAL NECESSITY: I certify, that based on my findings, NURSING services are a medically necessary home health service. HOME BOUND STATUS: I certify that my clinical findings support that this patient is homebound (i.e., Due to illness or injury, pt requires aid of supportive devices such as crutches, cane, wheelchairs, walkers, the use of special transportation or the assistance of another person to leave their place of residence. There is a normal inability to leave the home and doing so requires considerable and taxing effort. Other absences are for medical reasons / religious services and are infrequent or of short duration when for other reasons). If current dressing causes regression in wound condition, may D/C ordered dressing product/s and apply Normal Saline Moist Dressing daily until next [place] / Other MD appointment. [place] of regression in wound condition at [phone]. Please direct any NON-WOUND related issues/requests for orders to patient's Primary Care Physician Wound #2 Left,Proximal,Lateral Lower Leg: [place] Visits - *********PLEASE ORDER PTS SUPPLIES INCLUDING (RTD)******** Home Health Nurse may visit PRN to address patient s wound care needs. FACE TO FACE ENCOUNTER: MEDICARE and MEDICAID PATIENTS: I certify that this patient is under my care and that I had a face-to-face encounter that meets the physician face-to-face encounter requirements with this patient on this date. The encounter with the patient was in whole or in part for the following MEDICAL CONDITION: (primary reason for [place]) MEDICAL NECESSITY: I  certify, that based on my findings, NURSING services are a medically necessary home health service. HOME BOUND STATUS: I certify that my clinical findings support that this patient is homebound (i.e., Due to illness or injury, pt requires aid of supportive devices such as crutches, cane, wheelchairs, walkers, the use of special transportation or the assistance of another person to leave their place of residence. There is a normal inability to leave the home and doing so requires considerable and taxing effort. Other absences are for medical reasons / religious services and are infrequent or of short duration when for other reasons). If current dressing causes regression in wound condition, may D/C ordered dressing product/s and apply Normal Saline Moist Dressing daily until next [place] / Other MD appointment. [place] of regression in wound condition at [phone]. Please direct any NON-WOUND related issues/requests for orders to patient's Primary Care Physician dressing will continue with Hydrofera Blue and a light Kerlix bandage. She will not allow compression. KILYNN, FIUMARA ([id number]) Electronic Signature(s) Signed: 10/26/2015 10:32:32 AM By: Christin Fudge MD, FACS Entered By: Christin Fudge on 10/26/2015 10:32:32 ALLYSE, RONDINELLI ([id number]) -------------------------------------------------------------------------------- SuperBill Details Patient Name: SHAQUONNA, CHESNEY. Date of Service: 10/26/2015 Medical Record Number: [id number] Patient Account Number: [id number] Date of Birth/Sex: 1965-11-19 (50 y.o. 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 Facility Procedures CPT4 Code: [id number] Description: 99214 - WOUND CARE VISIT-LEV 4 EST PT Modifier: Quantity: 1 Physician Procedures CPT4: Description Modifier Quantity Code [id number] - WC PHYS LEVEL 3 - EST PT 1 ICD-10 Description Diagnosis T81.31XA Disruption of external operation (surgical) wound, not elsewhere classified, initial encounter F81.9 Developmental disorder of  scholastic skills, unspecified L97.322 Non-pressure chronic ulcer of left ankle with fat layer exposed Electronic Signature(s) Signed: 10/26/2015 2:58:24 PM By: Christin Fudge MD, FACS Signed: 10/26/2015 4:54:11 PM By: Rebecca Eaton RN, Sendra Previous Signature: 10/26/2015 10:32:47 AM Version By: Christin Fudge MD, FACS Entered By: Rebecca Eaton RN, Sendra on 10/26/2015 10:49:18

## 2015-11-02 ENCOUNTER — Encounter: Payer: Medicare Other | Admitting: Surgery

## 2015-11-02 DIAGNOSIS — L97322 Non-pressure chronic ulcer of left ankle with fat layer exposed: Secondary | ICD-10-CM | POA: Diagnosis not present

## 2015-11-02 NOTE — Progress Notes (Signed)
Roberta Hampton ([id number]) Visit Report for 11/02/2015 Arrival Information Details Patient Name: Roberta Hampton, Roberta Hampton 11/02/2015 10:15 Date of Service: AM Medical Record [id number] Number: Patient Account Number: [id number] [birth year] (50 y.o. Treating RN: Macarthur Critchley Date of Birth/Sex: Female) Other Clinician: [facility], Treating Primary Care Physician: Ricky Stabs Physician/Extender: Norville Haggard, Referring Physician: Rivka Safer in Treatment: 6 Visit Information History Since Last Visit All ordered tests and consults were completed: No Patient Arrived: Ambulatory Added or deleted any medications: No Arrival Time: 10:19 Any new allergies or adverse reactions: No Accompanied By: caregiver Had a fall or experienced change in No Transfer Assistance: None activities of daily living that may affect Patient Requires Transmission-Based No risk of falls: Precautions: Signs or symptoms of abuse/neglect since last No Patient Has Alerts: No visito Hospitalized since last visit: No Has Dressing in Place as Prescribed: Yes Pain Present Now: No Electronic Signature(s) Signed: 11/02/2015 4:13:06 PM By: Rebecca Eaton, RN, Sendra Entered By: Rebecca Eaton RN, Sendra on 11/02/2015 10:20:20 [name] ([id number]) -------------------------------------------------------------------------------- Clinic Level of Care Assessment Details Patient Name: Roberta Hampton 11/02/2015 10:15 Date of Service: AM Medical Record [id number] Number: Patient Account Number: [id number] [birth year] (50 y.o. Treating RN: Macarthur Critchley Date of Birth/Sex: Female) Other Clinician: [facility], Treating Primary Care Physician: Ricky Stabs Physician/Extender: Norville Haggard, Referring Physician: Rivka Safer in Treatment: 6 Clinic Level of Care Assessment Items TOOL 4 Quantity Score X - Use when only an EandM is performed on FOLLOW-UP visit 1 0 ASSESSMENTS - Nursing  Assessment / Reassessment X - Reassessment of Co-morbidities (includes updates in patient status) 1 10 X - Reassessment of Adherence to Treatment Plan 1 5 ASSESSMENTS - Wound and Skin Assessment / Reassessment []  - Simple Wound Assessment / Reassessment - one wound 0 X - Complex Wound Assessment / Reassessment - multiple wounds 2 5 []  - Dermatologic / Skin Assessment (not related to wound area) 0 ASSESSMENTS - Focused Assessment []  - Circumferential Edema Measurements - multi extremities 0 []  - Nutritional Assessment / Counseling / Intervention 0 X - Lower Extremity Assessment (monofilament, tuning fork, pulses) 1 5 []  - Peripheral Arterial Disease Assessment (using hand held doppler) 0 ASSESSMENTS - Ostomy and/or Continence Assessment and Care []  - Incontinence Assessment and Management 0 []  - Ostomy Care Assessment and Management (repouching, etc.) 0 PROCESS - Coordination of Care X - Simple Patient / Family Education for ongoing care 1 15 []  - Complex (extensive) Patient / Family Education for ongoing care 0 X - Staff obtains Consents, Records, Test Results / Process Orders 1 10 WILODEAN, PUROHIT R. ([id number]) X - Staff telephones HHA, Nursing Homes / Clarify orders / etc 1 10 []  - Routine Transfer to another Facility (non-emergent condition) 0 []  - Routine Hospital Admission (non-emergent condition) 0 []  - New Admissions / Biomedical engineer / Ordering NPWT, Apligraf, etc. 0 []  - Emergency Hospital Admission (emergent condition) 0 X - Simple Discharge Coordination 1 10 []  - Complex (extensive) Discharge Coordination 0 PROCESS - Special Needs []  - Pediatric / Minor Patient Management 0 []  - Isolation Patient Management 0 []  - Hearing / Language / Visual special needs 0 []  - Assessment of Community assistance (transportation, D/C planning, etc.) 0 []  - Additional assistance / Altered mentation 0 []  - Support Surface(s) Assessment (bed, cushion, seat, etc.) 0 INTERVENTIONS -  Wound Cleansing / Measurement []  - Simple Wound Cleansing - one wound 0 X - Complex Wound Cleansing - multiple wounds 2 5 X - Wound Imaging (photographs - any number of wounds)  1 5 []  - Wound Tracing (instead of photographs) 0 []  - Simple Wound Measurement - one wound 0 X - Complex Wound Measurement - multiple wounds 2 5 INTERVENTIONS - Wound Dressings X - Small Wound Dressing one or multiple wounds 1 10 []  - Medium Wound Dressing one or multiple wounds 0 []  - Large Wound Dressing one or multiple wounds 0 []  - Application of Medications - topical 0 TANDRIA, STAUP R. ([id number]) []  - Application of Medications - injection 0 INTERVENTIONS - Miscellaneous []  - External ear exam 0 []  - Specimen Collection (cultures, biopsies, blood, body fluids, etc.) 0 []  - Specimen(s) / Culture(s) sent or taken to Lab for analysis 0 []  - Patient Transfer (multiple staff / Harrel Lemon Lift / Similar devices) 0 []  - Simple Staple / Suture removal (25 or less) 0 []  - Complex Staple / Suture removal (26 or more) 0 []  - Hypo / Hyperglycemic Management (close monitor of Blood Glucose) 0 []  - Ankle / Brachial Index (ABI) - do not check if billed separately 0 X - Vital Signs 1 5 Has the patient been seen at the hospital within the last three years: Yes Total Score: 115 Level Of Care: New/Established - Level 3 Electronic Signature(s) Signed: 11/02/2015 4:13:06 PM By: Rebecca Eaton, RN, Sendra Entered By: Rebecca Eaton RN, Sendra on 11/02/2015 11:02:48 [name] ([id number]) -------------------------------------------------------------------------------- Encounter Discharge Information Details Patient Name: Roberta Hampton 11/02/2015 10:15 Date of Service: AM Medical Record [id number] Number: Patient Account Number: [id number] 1966-09-02 (50 y.o. Treating RN: Macarthur Critchley Date of Birth/Sex: Female) Other Clinician: [facility], Treating Primary Care Physician: Ricky Stabs  Physician/Extender: Norville Haggard, Referring Physician: Rivka Safer in Treatment: 6 Encounter Discharge Information Items Schedule Follow-up Appointment: No Medication Reconciliation completed and provided to Patient/Care No Walsie Smeltz: Provided on Clinical Summary of Care: 11/02/2015 Form Type Recipient Paper Patient CC Electronic Signature(s) Signed: 11/02/2015 10:39:25 AM By: Ruthine Dose Entered By: Ruthine Dose on 11/02/2015 10:39:25 [name] ([id number]) -------------------------------------------------------------------------------- Lower Extremity Assessment Details Patient Name: TAYDE, SHAMMAS 11/02/2015 10:15 Date of Service: AM Medical Record [id number] Number: Patient Account Number: [id number] April 03, 1966 (50 y.o. Treating RN: Macarthur Critchley Date of Birth/Sex: Female) Other Clinician: [facility], Treating Primary Care Physician: Ricky Stabs Physician/Extender: Norville Haggard, Referring Physician: Rivka Safer in Treatment: 6 Edema Assessment Assessed: [Left: No] [Right: No] Edema: [Left: N] [Right: o] Vascular Assessment Pulses: Posterior Tibial Dorsalis Pedis Palpable: [Left:Yes] Extremity colors, hair growth, and conditions: Extremity Color: [Left:Normal] Hair Growth on Extremity: [Left:Yes] Temperature of Extremity: [Left:Warm] Capillary Refill: [Left:< 3 seconds] Toe Nail Assessment Left: Right: Thick: No Discolored: No Deformed: No Improper Length and Hygiene: No Electronic Signature(s) Signed: 11/02/2015 4:13:06 PM By: Rebecca Eaton, RN, Sendra Entered By: Rebecca Eaton RN, Sendra on 11/02/2015 10:31:05 [name] ([id number]) -------------------------------------------------------------------------------- Pain Assessment Details Patient Name: MAKINLY, MANGEL 11/02/2015 10:15 Date of Service: AM Medical Record [id number] Number: Patient Account Number: [id number] 12-13-1965 (50 y.o. Treating RN: Macarthur Critchley Date of Birth/Sex: Female) Other Clinician: [facility], Treating Primary Care Physician: Ricky Stabs Physician/Extender: Norville Haggard, Referring Physician: Rivka Safer in Treatment: 6 Active Problems Location of Pain Severity and Description of Pain Patient Has Paino No Site Locations Rate the pain. Current Pain Level: 0 Pain Management and Medication Current Pain Management: Electronic Signature(s) Signed: 11/02/2015 4:13:06 PM By: Rebecca Eaton, RN, Sendra Entered By: Rebecca Eaton RN, Sendra on 11/02/2015 10:20:28 JENN, HIBBITTS ([id number]) -------------------------------------------------------------------------------- Wound Assessment Details Patient Name: MAEOLA, HOLDRIDGE 11/02/2015 10:15 Date of Service: AM Medical Record [id number] Number: Patient Account Number:  [id number] 1965/09/21 (50 y.o. Treating RN: Macarthur Critchley Date of Birth/Sex: Female) Other Clinician: [facility], Treating Primary Care Physician: Ricky Stabs Physician/Extender: Norville Haggard, Referring Physician: Rivka Safer in Treatment: 6 Wound Status Wound Number: 1 Primary Etiology: Trauma, Other Wound Location: Left Lower Leg - Lateral, Distal Wound Status: Open Wounding Event: Surgical Injury Comorbid History: Seizure Disorder Date Acquired: 05/07/2015 Weeks Of Treatment: 6 Clustered Wound: No Wound Measurements Length: (cm) 0 Width: (cm) 0 Depth: (cm) 0 Area: (cm) 0 Volume: (cm) 0 % Reduction in Area: 100% % Reduction in Volume: 100% Epithelialization: Large (67-100%) Tunneling: No Undermining: No Wound Description Classification: Partial Thickness Wound Margin: Flat and Intact Exudate Amount: None Present Foul Odor After Cleansing: No Wound Bed Granulation Amount: Large (67-100%) Exposed Structure Granulation Quality: Red, Pink, Hyper-granulation Fascia Exposed: No Necrotic Amount: None Present (0%) Fat Layer Exposed: No Tendon Exposed:  No Muscle Exposed: No Joint Exposed: No Bone Exposed: No Limited to Skin Breakdown Periwound Skin Texture Texture Color No Abnormalities Noted: No No Abnormalities Noted: No Localized Edema: No Temperature / Pain Moisture Temperature: No Abnormality Carrol, Jeanee R. ([id number]) No Abnormalities Noted: No Dry / Scaly: Yes Moist: No Wound Preparation Ulcer Cleansing: Rinsed/Irrigated with Saline Topical Anesthetic Applied: None Electronic Signature(s) Signed: 11/02/2015 4:13:06 PM By: Rebecca Eaton, RN, Sendra Entered By: Rebecca Eaton RN, Sendra on 11/02/2015 10:33:12 [name] ([id number]) -------------------------------------------------------------------------------- Wound Assessment Details Patient Name: TAKYAH, HAYASHI 11/02/2015 10:15 Date of Service: AM Medical Record [id number] Number: Patient Account Number: [id number] August 31, 1966 (50 y.o. Treating RN: Macarthur Critchley Date of Birth/Sex: Female) Other Clinician: [facility], Treating Primary Care Physician: Ricky Stabs Physician/Extender: Norville Haggard, Referring Physician: Rivka Safer in Treatment: 6 Wound Status Wound Number: 2 Primary Etiology: Trauma, Other Wound Location: Left Lower Leg - Lateral, Wound Status: Open Proximal Comorbid History: Seizure Disorder Wounding Event: Trauma Date Acquired: 10/14/2015 Weeks Of Treatment: 2 Clustered Wound: No Wound Measurements Length: (cm) 0.1 Width: (cm) 0.1 Depth: (cm) 0.1 Area: (cm) 0.008 Volume: (cm) 0.001 % Reduction in Area: 96.8% % Reduction in Volume: 96% Epithelialization: Large (67-100%) Tunneling: No Undermining: No Wound Description Classification: Partial Thickness Foul Odor A Exudate Amount: None Present fter Cleansing: No Wound Bed Granulation Amount: Large (67-100%) Granulation Quality: Pink Necrotic Amount: None Present (0%) Periwound Skin Texture Texture Color No Abnormalities Noted: No No Abnormalities Noted:  No Moisture No Abnormalities Noted: No Dry / Scaly: Yes Wound Preparation Ulcer Cleansing: Rinsed/Irrigated with Saline Topical Anesthetic Applied: None Tortorelli, Dolores R. ([id number]) Treatment Notes Wound #2 (Left, Proximal, Lateral Lower Leg) 1. Cleansed with: Clean wound with Normal Saline 4. Dressing Applied: Mepitel 5. Secondary Dressing Applied Bordered Foam Dressing Electronic Signature(s) Signed: 11/02/2015 4:13:06 PM By: Rebecca Eaton RN, Sendra Entered By: Rebecca Eaton RN, Sendra on 11/02/2015 10:33:32 STEVEN, PLACERES ([id number]) -------------------------------------------------------------------------------- Vitals Details Patient Name: ADALYNE, HEMBREE 11/02/2015 10:15 Date of Service: AM Medical Record [id number] Number: Patient Account Number: [id number] 15-Aug-1966 (50 y.o. Treating RN: Macarthur Critchley Date of Birth/Sex: Female) Other Clinician: [facility], Treating Primary Care Physician: Ricky Stabs Physician/Extender: Norville Haggard, Referring Physician: Rivka Safer in Treatment: 6 Vital Signs Time Taken: 10:31 Temperature (F): 97.5 Pulse (bpm): 79 Blood Pressure (mmHg): 116/68 Reference Range: 80 - 120 mg / dl Electronic Signature(s) Signed: 11/02/2015 4:13:06 PM By: Rebecca Eaton RN, Sendra Entered By: Rebecca Eaton RN, Sendra on 11/02/2015 10:31:37

## 2015-11-02 NOTE — Progress Notes (Addendum)
REECIE, LEAVITT ([id number]) Visit Report for 11/02/2015 Chief Complaint Document Details Patient Name: Roberta Hampton, Roberta Hampton 11/02/2015 10:15 Date of Service: AM Medical Record [id number] Number: Patient Account Number: [id number] [birth year] (50 y.o. Treating RN: Macarthur Critchley Date of Birth/Sex: Female) Other Clinician: [facility], Treating Primary Care Physician: Ricky Stabs Physician/Extender: Norville Haggard, Referring Physician: Rivka Safer in Treatment: 6 Information Obtained from: Caregiver Chief Complaint Patient presents to the wound care center with open non-healing surgical wound which she's had for about 2 months Electronic Signature(s) Signed: 11/02/2015 10:36:13 AM By: Christin Fudge MD, FACS Entered By: Christin Fudge on 11/02/2015 10:36:12 YUKIE, RODA ([id number]) -------------------------------------------------------------------------------- HPI Details Patient Name: Roberta Hampton, Roberta Hampton 11/02/2015 10:15 Date of Service: AM Medical Record [id number] Number: Patient Account Number: [id number] [birth year] (50 y.o. Treating RN: Macarthur Critchley Date of Birth/Sex: Female) Other Clinician: [facility], Treating Primary Care Physician: Ricky Stabs Physician/Extender: Norville Haggard, Referring Physician: Rivka Safer in Treatment: 6 History of Present Illness Location: left ankle area Quality: Patient reports experiencing a dull pain to affected area(s). Severity: Patient states wound are getting worse. Duration: Patient has had the wound for > 3 months prior to seeking treatment at the wound center Timing: Pain in wound is Intermittent (comes and goes Context: The wound occurred when the patient had a ORIF of the left ankle in September 2016 Modifying Factors: Other treatment(s) tried include: local care as prescribed by the orthopedic surgery group Associated Signs and Symptoms: Patient reports having increase swelling. HPI  Description: 50 year old patient who has development delays and has subnormal mental acuity, comes along with her caregivers with a nonhealing wound to the left lateral ankle which has been there for about 2-3 months. She had a ORIF of the left ankle in September 2016 and during follow-up visits was noted to have drainage and cellulitis from the wound which has been nonhealing. X-rays done in the orthopedic department have been normal as per the patient's caregivers but no reports are available. Past medical history significant for development delays, major depressive disorder, seizure disorder, status post hysterectomy, status post open reduction and internal fixation of bimalleolar left ankle fracture dislocation in September 2016. 10/12/2015 -- they have not received the RTD but have Hydrofera Blue sent to them. Electronic Signature(s) Signed: 11/02/2015 10:36:20 AM By: Christin Fudge MD, FACS Entered By: Christin Fudge on 11/02/2015 10:36:19 MIREILY, WICHERS ([id number]) -------------------------------------------------------------------------------- Physical Exam Details Patient Name: Roberta Hampton, Roberta Hampton 11/02/2015 10:15 Date of Service: AM Medical Record [id number] Number: Patient Account Number: [id number] 08/26/1966 (50 y.o. Treating RN: Macarthur Critchley Date of Birth/Sex: Female) Other Clinician: [facility], Treating Primary Care Physician: Ricky Stabs Physician/Extender: Norville Haggard, Referring Physician: Rivka Safer in Treatment: 6 Constitutional . Pulse regular. Respirations normal and unlabored. Afebrile. . Eyes Nonicteric. Reactive to light. Ears, Nose, Mouth, and Throat Lips, teeth, and gums WNL.Marland Kitchen Moist mucosa without lesions. Neck supple and nontender. No palpable supraclavicular or cervical adenopathy. Normal sized without goiter. Respiratory WNL. No retractions.. Cardiovascular Pedal Pulses WNL. No clubbing, cyanosis or edema. Lymphatic No  adneopathy. No adenopathy. No adenopathy. Musculoskeletal Adexa without tenderness or enlargement.. Digits and nails w/o clubbing, cyanosis, infection, petechiae, ischemia, or inflammatory conditions.. Integumentary (Hair, Skin) No suspicious lesions. No crepitus or fluctuance. No peri-wound warmth or erythema. No masses.Marland Kitchen Psychiatric Judgement and insight Intact.. No evidence of depression, anxiety, or agitation.. Notes the lower of the 2 wounds has completely healed in the left lateral lower extremity. The most superior wound has a micro-ulceration which is still open. Electronic Signature(s) Signed: 11/02/2015  10:36:52 AM By: Christin Fudge MD, FACS Entered By: Christin Fudge on 11/02/2015 10:36:51 DHANVI, BUCKMAN ([id number]) -------------------------------------------------------------------------------- Physician Orders Details Patient Name: Roberta Hampton, LOM 11/02/2015 10:15 Date of Service: AM Medical Record [id number] Number: Patient Account Number: [id number] April 22, 1966 (50 y.o. 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 o Cleanse wound with mild soap and water o May Shower, gently pat wound dry prior to applying new dressing. Wound #2  Left,Proximal,Lateral Lower Leg o Cleanse wound with mild soap and water o May Shower, gently pat wound dry prior to applying new dressing. o Cleanse wound with mild soap and water o May Shower, gently pat wound dry prior to applying new dressing. Primary Wound Dressing Wound #1 Left,Distal,Lateral Lower Leg o Mepitel One Wound #2 Left,Proximal,Lateral Lower Leg o Mepitel One Secondary Dressing Wound #1 Left,Distal,Lateral Lower Leg o Boardered Foam Dressing Wound #2 Left,Proximal,Lateral Lower Leg NATSHA, MCKEOWN R. ([id number]) o Boardered Foam Dressing Dressing Change Frequency Wound #1 Left,Distal,Lateral Lower Leg o Change dressing every week - leave dressing in place for one week. May change only if dressing gets wet underneath bandage Wound #2 Left,Proximal,Lateral Lower Leg o Change dressing every week - leave dressing in place for one week. May change only if dressing gets wet underneath bandage Follow-up Appointments Wound #1 Left,Distal,Lateral Lower Leg o Return Appointment in 1 week. Wound #2 Left,Proximal,Lateral Lower Leg o Return Appointment in 1 week. Home Health Wound #1 Left,Distal,Lateral Lower Leg o Continue Home Health Visits - *********PLEASE ORDER PTS SUPPLIES INCLUDING (RTD)******** o Home Health Nurse may visit PRN to address patientos wound care needs. o FACE TO FACE ENCOUNTER: MEDICARE and MEDICAID PATIENTS: I certify that this patient is under my care and that I had a face-to-face encounter that meets the physician face-to-face encounter requirements with this patient on this date. The encounter with the patient was in whole or in part for the following MEDICAL CONDITION: (primary reason for [place]) MEDICAL NECESSITY: I certify, that based on my findings, NURSING services are a medically necessary home health service. HOME BOUND STATUS: I certify that my clinical findings support that this patient is homebound  (i.e., Due to illness or injury, pt requires aid of supportive devices such as crutches, cane, wheelchairs, walkers, the use of special transportation or the assistance of another person to leave their place of residence. There is a normal inability to leave the home and doing so requires considerable and taxing effort. Other absences are for medical reasons / religious services and are infrequent or of short duration when for other reasons). o If current dressing causes regression in wound condition, may D/C ordered dressing product/s and apply Normal Saline Moist Dressing daily until next [place] / Other MD appointment. [place] of regression in wound condition at [phone]. o Please direct any NON-WOUND related issues/requests for orders to patient's Primary Care Physician Wound #2 Left,Proximal,Lateral Lower Leg o [place] Visits - *********  PLEASE ORDER PTS SUPPLIES INCLUDING (RTD)******** o Home Health Nurse may visit PRN to address patientos wound care needs. o FACE TO FACE ENCOUNTER: MEDICARE and MEDICAID PATIENTS: I certify that this patient is under my care and that I had a face-to-face encounter that meets the physician face-to-face encounter requirements with this patient on this date. The encounter with the patient was in whole or in part for the following MEDICAL CONDITION: (primary reason for [place]) MEDICAL NECESSITY: I certify, that based on my findings, NURSING services are a medically DAMISHA, HEIDEN. ([id number]) necessary home health service. HOME BOUND STATUS: I certify that my clinical findings support that this patient is homebound (i.e., Due to illness or injury, pt requires aid of supportive devices such as crutches, cane, wheelchairs, walkers, the use of special transportation or the assistance of another person to leave their place of residence. There is a normal inability to leave the home and doing  so requires considerable and taxing effort. Other absences are for medical reasons / religious services and are infrequent or of short duration when for other reasons). o If current dressing causes regression in wound condition, may D/C ordered dressing product/s and apply Normal Saline Moist Dressing daily until next [place] / Other MD appointment. [place] of regression in wound condition at [phone]. o Please direct any NON-WOUND related issues/requests for orders to patient's Primary Care Physician Electronic Signature(s) Signed: 11/02/2015 4:13:06 PM By: Ardean Larsen Signed: 11/02/2015 4:45:18 PM By: Christin Fudge MD, FACS Entered By: Rebecca Eaton RN, [place] on 11/02/2015 10:40:00 ANNUM, BRAUTIGAM ([id number]) -------------------------------------------------------------------------------- Problem List Details Patient Name: Roberta Hampton, Roberta Hampton 11/02/2015 10:15 Date of Service: AM Medical Record [id number] Number: Patient Account Number: [id number] 1965-10-11 (50 y.o. 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 Treating RN: Macarthur Critchley Date of Birth/Sex: Female) Other Clinician: [facility], Treating Primary Care Physician: Ricky Stabs Physician/Extender: Norville Haggard, Referring Physician: Rivka Safer in Treatment: 6 Subjective Chief Complaint Information obtained from Caregiver Patient presents to the wound care center with open non-healing surgical wound which she's had for about 2 months History of Present Illness (HPI) The following HPI elements were documented for the patient's wound: Location: left ankle area Quality: Patient reports experiencing a dull pain to affected area(s). Severity: Patient states wound are getting worse. Duration: Patient has had the wound for > 3 months prior to seeking treatment at the wound center Timing: Pain in wound is Intermittent (comes and goes Context: The wound occurred when the patient had a ORIF of the left ankle in September 2016 Modifying Factors: Other treatment(s) tried include: local care as prescribed by the orthopedic surgery group Associated Signs and Symptoms: Patient reports having increase swelling. 50 year old patient who has development delays and has subnormal mental acuity, comes along with her caregivers with a  nonhealing wound to the left lateral ankle which has been there for about 2-3 months. She had a ORIF of the left ankle in September 2016 and during follow-up visits was noted to have drainage and cellulitis from the wound which has been nonhealing. X-rays done in the orthopedic department have been normal as per the patient's caregivers but no reports are available. Past medical history significant for development delays, major depressive disorder, seizure disorder, status post hysterectomy, status post open reduction and internal fixation  of bimalleolar left ankle fracture dislocation in September 2016. 10/12/2015 -- they have not received the RTD but have Hydrofera Blue sent to them. ISADORE, KAMRATH ([id number]) Objective Constitutional Pulse regular. Respirations normal and unlabored. Afebrile. Vitals Time Taken: 10:31 AM, Temperature: 97.5 F, Pulse: 79 bpm, Blood Pressure: 116/68 mmHg. Eyes Nonicteric. Reactive to light. Ears, Nose, Mouth, and Throat Lips, teeth, and gums WNL.Marland Kitchen Moist mucosa without lesions. Neck supple and nontender. No palpable supraclavicular or cervical adenopathy. Normal sized without goiter. Respiratory WNL. No retractions.. Cardiovascular Pedal Pulses WNL. No clubbing, cyanosis or edema. Lymphatic No adneopathy. No adenopathy. No adenopathy. Musculoskeletal Adexa without tenderness or enlargement.. Digits and nails w/o clubbing, cyanosis, infection, petechiae, ischemia, or inflammatory conditions.Marland Kitchen Psychiatric Judgement and insight Intact.. No evidence of depression, anxiety, or agitation.. General Notes: the lower of the 2 wounds has completely healed in the left lateral lower extremity. The most superior wound has a micro-ulceration which is still open. Integumentary (Hair, Skin) No suspicious lesions. No crepitus or fluctuance. No peri-wound warmth or erythema. No masses.. Wound #1 status is Open. Original cause of wound was Surgical Injury. The wound is located on the Left,Distal,Lateral Lower Leg. The wound measures 0cm length x 0cm width x 0cm depth; 0cm^2 area and 0cm^3 volume. The wound is limited to skin breakdown. There is no tunneling or undermining noted. There is a none present amount of drainage noted. The wound margin is flat and intact. There is large (67-100%) red, pink granulation within the wound bed. There is no necrotic tissue within the wound bed. The periwound skin appearance exhibited: Dry/Scaly. The periwound skin appearance did not exhibit: Localized Edema,  Moist. Periwound temperature was noted as No Abnormality. Wound #2 status is Open. Original cause of wound was Trauma. The wound is located on the [place]. ([id number]) Left,Proximal,Lateral Lower Leg. The wound measures 0.1cm length x 0.1cm width x 0.1cm depth; 0.008cm^2 area and 0.001cm^3 volume. There is no tunneling or undermining noted. There is a none present amount of drainage noted. There is large (67-100%) pink granulation within the wound bed. There is no necrotic tissue within the wound bed. The periwound skin appearance exhibited: Dry/Scaly. Assessment Active Problems ICD-10 T81.31XA - Disruption of external operation (surgical) wound, not elsewhere classified, initial encounter F81.9 - Developmental disorder of scholastic skills, unspecified L97.322 - Non-pressure chronic ulcer of left ankle with fat layer exposed I recommended a piece of Mepitel and a bordered foam over this and leave it alone for the week if possible. I anticipate she'll be healed by next week. Plan Wound Cleansing: Wound #1 Left,Distal,Lateral Lower Leg: Cleanse wound with mild soap and water May Shower, gently pat wound dry prior to applying new dressing. Cleanse wound with mild soap and water May Shower, gently pat wound dry prior to applying new dressing. Wound #2 Left,Proximal,Lateral Lower Leg: Cleanse wound with mild soap and water May Shower, gently pat wound dry prior to applying new dressing. Cleanse wound with mild soap and water May Shower,  gently pat wound dry prior to applying new dressing. Primary Wound Dressing: Wound #1 Left,Distal,Lateral Lower Leg: Mepitel One Wound #2 Left,Proximal,Lateral Lower Leg: Mepitel One Secondary Dressing: Wound #1 Left,Distal,Lateral Lower Leg: Boardered Foam Dressing Wound #2 Left,Proximal,Lateral Lower Leg: Boardered Foam Dressing KAILAH, MARRON R. ([id number]) Dressing Change Frequency: Wound #1 Left,Distal,Lateral Lower Leg: Change  dressing every week - leave dressing in place for one week. May change only if dressing gets wet underneath bandage Wound #2 Left,Proximal,Lateral Lower Leg: Change dressing every week - leave dressing in place for one week. May change only if dressing gets wet underneath bandage Follow-up Appointments: Wound #1 Left,Distal,Lateral Lower Leg: Return Appointment in 1 week. Wound #2 Left,Proximal,Lateral Lower Leg: Return Appointment in 1 week. Home Health: Wound #1 Left,Distal,Lateral Lower Leg: Continue Home Health Visits - *********PLEASE ORDER PTS SUPPLIES INCLUDING (RTD)******** Home Health Nurse may visit PRN to address patient s wound care needs. FACE TO FACE ENCOUNTER: MEDICARE and MEDICAID PATIENTS: I certify that this patient is under my care and that I had a face-to-face encounter that meets the physician face-to-face encounter requirements with this patient on this date. The encounter with the patient was in whole or in part for the following MEDICAL CONDITION: (primary reason for [place]) MEDICAL NECESSITY: I certify, that based on my findings, NURSING services are a medically necessary home health service. HOME BOUND STATUS: I certify that my clinical findings support that this patient is homebound (i.e., Due to illness or injury, pt requires aid of supportive devices such as crutches, cane, wheelchairs, walkers, the use of special transportation or the assistance of another person to leave their place of residence. There is a normal inability to leave the home and doing so requires considerable and taxing effort. Other absences are for medical reasons / religious services and are infrequent or of short duration when for other reasons). If current dressing causes regression in wound condition, may D/C ordered dressing product/s and apply Normal Saline Moist Dressing daily until next [place] / Other MD appointment. [place] of regression in  wound condition at [phone]. Please direct any NON-WOUND related issues/requests for orders to patient's Primary Care Physician Wound #2 Left,Proximal,Lateral Lower Leg: [place] Visits - *********PLEASE ORDER PTS SUPPLIES INCLUDING (RTD)******** Home Health Nurse may visit PRN to address patient s wound care needs. FACE TO FACE ENCOUNTER: MEDICARE and MEDICAID PATIENTS: I certify that this patient is under my care and that I had a face-to-face encounter that meets the physician face-to-face encounter requirements with this patient on this date. The encounter with the patient was in whole or in part for the following MEDICAL CONDITION: (primary reason for [place]) MEDICAL NECESSITY: I certify, that based on my findings, NURSING services are a medically necessary home health service. HOME BOUND STATUS: I certify that my clinical findings support that this patient is homebound (i.e., Due to illness or injury, pt requires aid of supportive devices such as crutches, cane, wheelchairs, walkers, the use of special transportation or the assistance of another person to leave their place of residence. There is a normal inability to leave the home and doing so requires considerable and taxing effort. Other absences are for medical reasons / religious services and are infrequent or of short duration when for other reasons). If current dressing causes regression in wound condition, may D/C ordered dressing product/s and apply Normal Saline Moist Dressing daily until next [place] / Other MD appointment. [address] of  regression in wound condition at [phone]. Please direct any NON-WOUND related issues/requests for orders to patient's Primary Care Physician TYPHANI, PARATORE. ([id number]) I recommended a piece of Mepitel and a bordered foam over this and leave it alone for the week if possible. I anticipate she'll be healed by next week. Electronic  Signature(s) Signed: 11/02/2015 4:47:59 PM By: Christin Fudge MD, FACS Previous Signature: 11/02/2015 10:37:34 AM Version By: Christin Fudge MD, FACS Entered By: Christin Fudge on 11/02/2015 16:47:58 DELITHA, WOEHR ([id number]) -------------------------------------------------------------------------------- SuperBill Details Patient Name: Roberta Hampton, Roberta Hampton. Date of Service: 11/02/2015 Medical Record Number: [id number] Patient Account Number: [id number] Date of Birth/Sex: 1966/05/14 (50 y.o. 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 Facility Procedures CPT4 Code: [id number] Description: 99213 - WOUND CARE VISIT-LEV 3 EST PT Modifier: Quantity: 1 Physician Procedures CPT4: Description Modifier Quantity Code [id number] 99213 - WC PHYS LEVEL 3 - EST PT 1 ICD-10 Description Diagnosis T81.31XA Disruption of external operation (surgical) wound, not elsewhere classified, initial encounter F81.9 Developmental disorder of  scholastic skills, unspecified L97.322 Non-pressure chronic ulcer of left ankle with fat layer exposed Electronic Signature(s) Signed: 11/02/2015 4:13:06 PM By: Ardean Larsen Signed: 11/02/2015 4:45:18 PM By: Christin Fudge MD, FACS Previous Signature: 11/02/2015 10:37:56 AM Version By: Christin Fudge MD, FACS Entered By: Rebecca Eaton RN, [place] on 11/02/2015 11:02:57

## 2015-11-09 ENCOUNTER — Encounter: Payer: Medicare Other | Attending: Surgery | Admitting: Surgery

## 2015-11-09 DIAGNOSIS — F819 Developmental disorder of scholastic skills, unspecified: Secondary | ICD-10-CM | POA: Diagnosis not present

## 2015-11-09 DIAGNOSIS — L97322 Non-pressure chronic ulcer of left ankle with fat layer exposed: Secondary | ICD-10-CM | POA: Diagnosis not present

## 2015-11-09 DIAGNOSIS — G40909 Epilepsy, unspecified, not intractable, without status epilepticus: Secondary | ICD-10-CM | POA: Diagnosis not present

## 2015-11-09 DIAGNOSIS — Y839 Surgical procedure, unspecified as the cause of abnormal reaction of the patient, or of later complication, without mention of misadventure at the time of the procedure: Secondary | ICD-10-CM | POA: Insufficient documentation

## 2015-11-09 DIAGNOSIS — R233 Spontaneous ecchymoses: Secondary | ICD-10-CM | POA: Insufficient documentation

## 2015-11-09 DIAGNOSIS — T8131XA Disruption of external operation (surgical) wound, not elsewhere classified, initial encounter: Secondary | ICD-10-CM | POA: Diagnosis not present

## 2015-11-09 NOTE — Progress Notes (Signed)
EZMERALDA, LUCERO ([id number]) Visit Report for 11/09/2015 Arrival Information Details Patient Name: Roberta, Hampton. Date of Service: 11/09/2015 9:30 AM Medical Record Number: [id number] Patient Account Number: [id number] Date of Birth/Sex: 1965/09/27 (50 y.o. Female) Treating RN: Macarthur Critchley Primary Care Physician: [name] Other Clinician: Referring Physician: [name] Treating Physician/Extender: Frann Rider in Treatment: 7 Visit Information History Since Last Visit All ordered tests and consults were completed: No Patient Arrived: Ambulatory Added or deleted any medications: Yes Arrival Time: 09:26 Any new allergies or adverse reactions: No Accompanied By: caregivers Had a fall or experienced change in No Transfer Assistance: None activities of daily living that may affect Patient Identification Verified: Yes risk of falls: Secondary Verification Process Yes Signs or symptoms of abuse/neglect since last No Completed: visito Patient Requires Transmission-Based No Hospitalized since last visit: No Precautions: Has Dressing in Place as Prescribed: Yes Patient Has Alerts: No Pain Present Now: No Electronic Signature(s) Signed: 11/09/2015 3:39:32 PM By: Rebecca Eaton, RN, Sendra Entered By: Rebecca Eaton RN, Sendra on 11/09/2015 09:26:38 Roberta Hampton ([id number]) -------------------------------------------------------------------------------- Clinic Level of Care Assessment Details Patient Name: Roberta Hampton. Date of Service: 11/09/2015 9:30 AM Medical Record Number: [id number] Patient Account Number: [id number] Date of Birth/Sex: April 14, 1966 (50 y.o. Female) Treating RN: Macarthur Critchley Primary Care Physician: [name] Other Clinician: Referring Physician: [name] Treating Physician/Extender: Frann Rider in Treatment: 7 Clinic Level of Care Assessment Items TOOL 4 Quantity Score X - Use when only  an EandM is performed on FOLLOW-UP visit 1 0 ASSESSMENTS - Nursing Assessment / Reassessment X - Reassessment of Co-morbidities (includes updates in patient status) 1 10 X - Reassessment of Adherence to Treatment Plan 1 5 ASSESSMENTS - Wound and Skin Assessment / Reassessment X - Simple Wound Assessment / Reassessment - one wound 1 5 []  - Complex Wound Assessment / Reassessment - multiple wounds 0 []  - Dermatologic / Skin Assessment (not related to wound area) 0 ASSESSMENTS - Focused Assessment X - Circumferential Edema Measurements - multi extremities 1 5 []  - Nutritional Assessment / Counseling / Intervention 0 X - Lower Extremity Assessment (monofilament, tuning fork, pulses) 1 5 []  - Peripheral Arterial Disease Assessment (using hand held doppler) 0 ASSESSMENTS - Ostomy and/or Continence Assessment and Care []  - Incontinence Assessment and Management 0 []  - Ostomy Care Assessment and Management (repouching, etc.) 0 PROCESS - Coordination of Care X - Simple Patient / Family Education for ongoing care 1 15 []  - Complex (extensive) Patient / Family Education for ongoing care 0 X - Staff obtains Programmer, systems, Records, Test Results / Process Orders 1 10 []  - Staff telephones HHA, Nursing Homes / Clarify orders / etc 0 []  - Routine Transfer to another Facility (non-emergent condition) 0 Roberta Hampton, Roberta R. ([id number]) []  - Routine Hospital Admission (non-emergent condition) 0 []  - New Admissions / Biomedical engineer / Ordering NPWT, Apligraf, etc. 0 []  - Emergency Hospital Admission (emergent condition) 0 X - Simple Discharge Coordination 1 10 []  - Complex (extensive) Discharge Coordination 0 PROCESS - Special Needs []  - Pediatric / Minor Patient Management 0 []  - Isolation Patient Management 0 []  - Hearing / Language / Visual special needs 0 []  - Assessment of Community assistance (transportation, D/C planning, etc.) 0 []  - Additional assistance / Altered mentation 0 []  - Support  Surface(s) Assessment (bed, cushion, seat, etc.) 0 INTERVENTIONS - Wound Cleansing / Measurement X - Simple Wound Cleansing - one wound 1 5 []  - Complex Wound Cleansing - multiple wounds 0 []  -  Wound Imaging (photographs - any number of wounds) 0 []  - Wound Tracing (instead of photographs) 0 X - Simple Wound Measurement - one wound 1 5 []  - Complex Wound Measurement - multiple wounds 0 INTERVENTIONS - Wound Dressings X - Small Wound Dressing one or multiple wounds 1 10 []  - Medium Wound Dressing one or multiple wounds 0 []  - Large Wound Dressing one or multiple wounds 0 []  - Application of Medications - topical 0 []  - Application of Medications - injection 0 INTERVENTIONS - Miscellaneous []  - External ear exam 0 Roberta Hampton, Roberta R. ([id number]) []  - Specimen Collection (cultures, biopsies, blood, body fluids, etc.) 0 []  - Specimen(s) / Culture(s) sent or taken to Lab for analysis 0 []  - Patient Transfer (multiple staff / Harrel Lemon Lift / Similar devices) 0 []  - Simple Staple / Suture removal (25 or less) 0 []  - Complex Staple / Suture removal (26 or more) 0 []  - Hypo / Hyperglycemic Management (close monitor of Blood Glucose) 0 []  - Ankle / Brachial Index (ABI) - do not check if billed separately 0 X - Vital Signs 1 5 Has the patient been seen at the hospital within the last three years: Yes Total Score: 90 Level Of Care: New/Established - Level 3 Electronic Signature(s) Signed: 11/09/2015 3:39:32 PM By: Rebecca Eaton, RN, Sendra Entered By: Rebecca Eaton RN, Sendra on 11/09/2015 10:33:48 Roberta Hampton ([id number]) -------------------------------------------------------------------------------- Encounter Discharge Information Details Patient Name: Roberta Hampton. Date of Service: 11/09/2015 9:30 AM Medical Record Number: [id number] Patient Account Number: [id number] Date of Birth/Sex: 1966-04-14 (50 y.o. Female) Treating RN: Macarthur Critchley Primary Care Physician: [name] Other Clinician: Referring Physician: [name] Treating Physician/Extender: Frann Rider in Treatment: 7 Encounter Discharge Information Items Facility Notification Discharge Pain Level: 0 Facility Type: Home Health Discharge Condition: Stable Orders Sent: Yes Ambulatory Status: Ambulatory Discharge Destination: Home Transportation: Private Auto Accompanied By: caregiver Schedule Follow-up Appointment: Yes Medication Reconciliation completed and provided to Patient/Care Yes Chai Verdejo: Provided on Clinical Summary of Care: 11/09/2015 Form Type Recipient Paper Patient CC Electronic Signature(s) Signed: 11/09/2015 3:39:32 PM By: Rebecca Eaton RN, Sendra Previous Signature: 11/09/2015 9:50:33 AM Version By: Ruthine Dose Entered By: Rebecca Eaton RN, Sendra on 11/09/2015 09:56:08 Roberta Hampton ([id number]) -------------------------------------------------------------------------------- Lower Extremity Assessment Details Patient Name: Roberta Hampton, Roberta Hampton. Date of Service: 11/09/2015 9:30 AM Medical Record Number: [id number] Patient Account Number: [id number] Date of Birth/Sex: 12-23-1965 (50 y.o. Female) Treating RN: Macarthur Critchley Primary Care Physician: [name] Other Clinician: Referring Physician: [name] Treating Physician/Extender: Frann Rider in Treatment: 7 Edema Assessment Assessed: [Left: No] [Right: No] Edema: [Left: Ye] [Right: s] Calf Left: Right: Point of Measurement: 28 cm From Medial Instep 34 cm cm Ankle Left: Right: Point of Measurement: 9 cm From Medial Instep 22 cm cm Vascular Assessment Pulses: Posterior Tibial Dorsalis Pedis Palpable: [Left:Yes] Electronic Signature(s) Signed: 11/09/2015 3:39:32 PM By: Rebecca Eaton, RN, Sendra Entered By: Rebecca Eaton RN, Sendra on 11/09/2015 09:28:35 Roberta Hampton ([id number]) -------------------------------------------------------------------------------- Pain  Assessment Details Patient Name: Roberta Hampton. Date of Service: 11/09/2015 9:30 AM Medical Record Number: [id number] Patient Account Number: [id number] Date of Birth/Sex: 07-22-1966 (50 y.o. Female) Treating RN: Macarthur Critchley Primary Care Physician: [name] Other Clinician: Referring Physician: [name] Treating Physician/Extender: Frann Rider in Treatment: 7 Active Problems Location of Pain Severity and Description of Pain Patient Has Paino No Site Locations Rate the pain. Current Pain Level: 0 Pain Management and Medication Current Pain Management: Electronic Signature(s) Signed: 11/09/2015 3:39:32 PM By: Rebecca Eaton, RN,  Roslynn Amble Entered By: Rebecca Eaton, RN, Sendra on 11/09/2015 09:26:46 Roberta Hampton ([id number]) -------------------------------------------------------------------------------- Patient/Caregiver Education Details Patient Name: Roberta Hampton. Date of Service: 11/09/2015 9:30 AM Medical Record Number: [id number] Patient Account Number: [id number] Date of Birth/Gender: Apr 02, 1966 (50 y.o. Female) Treating RN: Macarthur Critchley Primary Care Physician: [name] Other Clinician: Referring Physician: [name] Treating Physician/Extender: Frann Rider in Treatment: 7 Education Assessment Education Provided To: Patient and Caregiver Education Topics Provided Venous: Handouts: Other: kerlix and coban Methods: Demonstration, Explain/Verbal Responses: State content correctly Wound/Skin Impairment: Handouts: Caring for Your Ulcer, Skin Care Do's and Dont's Methods: Demonstration, Explain/Verbal Responses: State content correctly Electronic Signature(s) Signed: 11/09/2015 3:39:32 PM By: Rebecca Eaton, RN, Sendra Entered By: Rebecca Eaton RN, Sendra on 11/09/2015 09:56:33 Roberta Hampton, Roberta Hampton ([id number]) -------------------------------------------------------------------------------- Wound Assessment  Details Patient Name: Roberta Hampton. Date of Service: 11/09/2015 9:30 AM Medical Record Number: [id number] Patient Account Number: [id number] Date of Birth/Sex: March 11, 1966 (50 y.o. Female) Treating RN: Macarthur Critchley Primary Care Physician: [name] Other Clinician: Referring Physician: [name] Treating Physician/Extender: Frann Rider in Treatment: 7 Wound Status Wound Number: 2 Primary Etiology: Trauma, Other Wound Location: Left Lower Leg - Lateral, Wound Status: Converted Proximal Comorbid History: Seizure Disorder Wounding Event: Trauma Date Acquired: 10/14/2015 Weeks Of Treatment: 3 Clustered Wound: No Photos Photo Uploaded By: Rebecca Eaton RN, Roslynn Amble on 11/09/2015 15:33:31 Wound Measurements Length: (cm) 0.1 Width: (cm) 0.1 Depth: (cm) 0.1 Area: (cm) 0.008 Volume: (cm) 0.001 % Reduction in Area: 96.8% % Reduction in Volume: 96% Epithelialization: None Tunneling: No Undermining: No Wound Description Classification: Partial Thickness Wound Margin: Well defined, not attached Exudate Amount: Medium Exudate Type: Serosanguineous Exudate Color: red, brown Foul Odor After Cleansing: No Wound Bed Granulation Amount: Large (67-100%) Granulation Quality: Pink Necrotic Amount: Small (1-33%) Roberta Hampton, Roberta Hampton ([id number]) Necrotic Quality: Adherent Slough Periwound Skin Texture Texture Color No Abnormalities Noted: No No Abnormalities Noted: No Localized Edema: Yes Erythema: Yes Erythema Location: Circumferential Moisture No Abnormalities Noted: No Temperature / Pain Dry / Scaly: No Tenderness on Palpation: Yes Maceration: No Moist: No Wound Preparation Ulcer Cleansing: Rinsed/Irrigated with Saline Topical Anesthetic Applied: None Assessment Notes hit leg at workshop, new wound established Electronic Signature(s) Signed: 11/09/2015 3:39:32 PM By: Rebecca Eaton, RN, Sendra Entered By: Rebecca Eaton RN, Sendra on 11/09/2015  09:39:56 Roberta Hampton, Roberta Hampton ([id number]) -------------------------------------------------------------------------------- Wound Assessment Details Patient Name: Roberta Hampton. Date of Service: 11/09/2015 9:30 AM Medical Record Number: [id number] Patient Account Number: [id number] Date of Birth/Sex: 02-Jun-1966 (50 y.o. Female) Treating RN: Macarthur Critchley Primary Care Physician: [name] Other Clinician: Referring Physician: [name] Treating Physician/Extender: Frann Rider in Treatment: 7 Wound Status Wound Number: 3 Primary Etiology: Trauma, Other Wound Location: Left Lower Leg Wound Status: Open Wounding Event: Trauma Comorbid History: Seizure Disorder Date Acquired: 11/06/2015 Weeks Of Treatment: 0 Clustered Wound: No Photos Photo Uploaded By: Rebecca Eaton, RN, Roslynn Amble on 11/09/2015 15:33:31 Wound Measurements Length: (cm) 5.5 Width: (cm) 2.3 Depth: (cm) 0.1 Area: (cm) 9.935 Volume: (cm) 0.994 % Reduction in Area: % Reduction in Volume: Epithelialization: None Tunneling: No Undermining: No Wound Description Classification: Partial Thickness Wound Margin: Flat and Intact Exudate Amount: Medium Exudate Type: Serosanguineous Exudate Color: red, brown Foul Odor After Cleansing: No Wound Bed Granulation Amount: Medium (34-66%) Exposed Structure Granulation Quality: Pink Fascia Exposed: No Necrotic Amount: Small (1-33%) Fat Layer Exposed: No Necrotic Quality: Adherent Slough Tendon Exposed: No Roberta Hampton, Roberta R. ([id number]) Muscle Exposed: No Joint Exposed: No Bone Exposed: No Limited to Skin Breakdown Periwound Skin Texture Texture Color  No Abnormalities Noted: No No Abnormalities Noted: No Localized Edema: Yes Ecchymosis: Yes Moisture Temperature / Pain No Abnormalities Noted: No Temperature: No Abnormality Tenderness on Palpation: Yes Wound Preparation Ulcer Cleansing: Rinsed/Irrigated with Saline Topical Anesthetic  Applied: None Assessment Notes patient hit leg against a chair at workshop Treatment Notes Wound #3 (Left Lower Leg) 1. Cleansed with: Clean wound with Normal Saline 4. Dressing Applied: Aquacel Ag 5. Secondary Dressing Applied Bordered Foam Dressing 7. Secured with Other (specify in notes) Notes kerlix and Event organiser) Signed: 11/09/2015 3:39:32 PM By: Rebecca Eaton, RN, Sendra Entered By: Rebecca Eaton RN, Sendra on 11/09/2015 09:39:20 Roberta Hampton ([id number]) -------------------------------------------------------------------------------- [place] Details Patient Name: Roberta Hampton. Date of Service: 11/09/2015 9:30 AM Medical Record Number: [id number] Patient Account Number: [id number] Date of Birth/Sex: May 15, 1966 (50 y.o. Female) Treating RN: Macarthur Critchley Primary Care Physician: [name] Other Clinician: Referring Physician: [name] Treating Physician/Extender: Frann Rider in Treatment: 7 Vital Signs Time Taken: 09:18 Temperature (F): 97.8 Pulse (bpm): 78 Blood Pressure (mmHg): 102/69 Reference Range: 80 - 120 mg / dl Electronic Signature(s) Signed: 11/09/2015 3:39:32 PM By: Rebecca Eaton RN, Sendra Entered By: Rebecca Eaton RN, Sendra on 11/09/2015 09:50:21

## 2015-11-09 NOTE — Progress Notes (Signed)
YARDEN, ROTTLER ([id number]) Visit Report for 11/09/2015 Chief Complaint Document Details Patient Name: Roberta Hampton, Roberta Hampton. Date of Service: 11/09/2015 9:30 AM Medical Record Number: [id number] Patient Account Number: [id number] Date of Birth/Sex: 1965-11-22 (50 y.o. Female) Treating RN: Macarthur Critchley Primary Care Physician: [name] Other Clinician: Referring Physician: [name] Treating Physician/Extender: Frann Rider in Treatment: 7 Information Obtained from: Caregiver Chief Complaint Patient presents to the wound care center with open non-healing surgical wound which she's had for about 2 months Electronic Signature(s) Signed: 11/09/2015 9:56:29 AM By: Christin Fudge MD, FACS Entered By: Christin Fudge on 11/09/2015 09:56:29 Napp, Lessie Dings ([id number]) -------------------------------------------------------------------------------- HPI Details Patient Name: Roberta Hampton. Date of Service: 11/09/2015 9:30 AM Medical Record Number: [id number] Patient Account Number: [id number] Date of Birth/Sex: 04-22-1966 (50 y.o. Female) Treating RN: Macarthur Critchley Primary Care Physician: [name] Other Clinician: Referring Physician: [name] Treating Physician/Extender: Frann Rider in Treatment: 7 History of Present Illness Location: left ankle area Quality: Patient reports experiencing a dull pain to affected area(s). Severity: Patient states wound are getting worse. Duration: Patient has had the wound for > 3 months prior to seeking treatment at the wound center Timing: Pain in wound is Intermittent (comes and goes Context: The wound occurred when the patient had a ORIF of the left ankle in September 2016 Modifying Factors: Other treatment(s) tried include: local care as prescribed by the orthopedic surgery group Associated Signs and Symptoms: Patient reports having increase swelling. HPI Description: 50 year old  patient who has development delays and has subnormal mental acuity, comes along with her caregivers with a nonhealing wound to the left lateral ankle which has been there for about 2-3 months. She had a ORIF of the left ankle in September 2016 and during follow-up visits was noted to have drainage and cellulitis from the wound which has been nonhealing. X-rays done in the orthopedic department have been normal as per the patient's caregivers but no reports are available. Past medical history significant for development delays, major depressive disorder, seizure disorder, status post hysterectomy, status post open reduction and internal fixation of bimalleolar left ankle fracture dislocation in September 2016. 10/12/2015 -- they have not received the RTD but have Hydrofera Blue sent to them. 11/09/2015 -- 3 days ago she injured her foot against a chair and has had ecchymosis hematoma and a fresh wound which was a little superior to the one which was almost healed. The patient was really close to discharge but now has had another wound. He has been on doxycycline. Electronic Signature(s) Signed: 11/09/2015 9:57:24 AM By: Christin Fudge MD, FACS Entered By: Christin Fudge on 11/09/2015 09:57:24 SIRAH, SLAYMAKER ([id number]) -------------------------------------------------------------------------------- Physical Exam Details Patient Name: Roberta Hampton. Date of Service: 11/09/2015 9:30 AM Medical Record Number: [id number] Patient Account Number: [id number] Date of Birth/Sex: 1965/12/08 (50 y.o. Female) Treating RN: Macarthur Critchley Primary Care Physician: [name] Other Clinician: Referring Physician: [name] Treating Physician/Extender: Frann Rider in Treatment: 7 Constitutional . Pulse regular. Respirations normal and unlabored. Afebrile. . Eyes Nonicteric. Reactive to light. Ears, Nose, Mouth, and Throat Lips, teeth, and gums WNL.Marland Kitchen Moist mucosa  without lesions. Neck supple and nontender. No palpable supraclavicular or cervical adenopathy. Normal sized without goiter. Respiratory WNL. No retractions.. Cardiovascular Pedal Pulses WNL. No clubbing, cyanosis or edema. Lymphatic No adneopathy. No adenopathy. No adenopathy. Musculoskeletal Adexa without tenderness or enlargement.. Digits and nails w/o clubbing, cyanosis, infection, petechiae, ischemia, or inflammatory conditions.. Integumentary (Hair, Skin) No suspicious lesions. No crepitus or  fluctuance. No peri-wound warmth or erythema. No masses.Marland Kitchen Psychiatric Judgement and insight Intact.. No evidence of depression, anxiety, or agitation.. Notes due to the injury she has got a fresh wound and there is a lot of ecchymosis and surrounding hematoma. Debris over the wounds was washed away with moist saline gauze and she has much tenderness in this area and will not allow deeper debridement. Electronic Signature(s) Signed: 11/09/2015 9:58:14 AM By: Christin Fudge MD, FACS Entered By: Christin Fudge on 11/09/2015 09:58:13 TAYVEN, LEYDON ([id number]) -------------------------------------------------------------------------------- Physician Orders Details Patient Name: Roberta Hampton. Date of Service: 11/09/2015 9:30 AM Medical Record Number: [id number] Patient Account Number: [id number] Date of Birth/Sex: 03-Jul-1966 (50 y.o. Female) Treating RN: Macarthur Critchley Primary Care Physician: [name] Other Clinician: Referring Physician: [name] Treating Physician/Extender: Frann Rider in Treatment: 7 Verbal / Phone Orders: Yes Clinician: Macarthur Critchley Read Back and Verified: Yes Diagnosis Coding Wound Cleansing Wound #3 Left Lower Leg o Cleanse wound with mild soap and water o May Shower, gently pat wound dry prior to applying new dressing. Primary Wound Dressing Wound #3 Left Lower Leg o Aquacel Ag Secondary Dressing Wound #3  Left Lower Leg o Boardered Foam Dressing Dressing Change Frequency Wound #3 Left Lower Leg o Change dressing every other day. Follow-up Appointments Wound #3 Left Lower Leg o Return Appointment in 1 week. Edema Control Wound #3 Left Lower Leg o Other: - kerlix and coban Notes ok for [place] to work. No restrictions from wound care stand point. Electronic Signature(s) Signed: 11/09/2015 3:39:32 PM By: Ardean Larsen Signed: 11/09/2015 3:57:47 PM By: Christin Fudge MD, FACS Entered By: Rebecca Eaton RN, Sendra on 11/09/2015 09:51:05 JULIETH, PUNG ([id number]) -------------------------------------------------------------------------------- Problem List Details Patient Name: RUKMINI, ROBUCK. Date of Service: 11/09/2015 9:30 AM Medical Record Number: [id number] Patient Account Number: [id number] Date of Birth/Sex: 1966/08/15 (50 y.o. 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 Date of Service: 11/09/2015 9:30 AM Medical Record Number: [id number] Patient Account Number: [id number] Date of  Birth/Sex: April 07, 1966 (50 y.o. Female) Treating RN: Macarthur Critchley Primary Care Physician: [name] Other Clinician: Referring Physician: [name] Treating Physician/Extender: Frann Rider in Treatment: 7 Subjective Chief Complaint Information obtained from Caregiver Patient presents to the wound care center with open non-healing surgical wound which she's had for about 2 months History of Present Illness (HPI) The following HPI elements were documented for the patient's wound: Location: left ankle area Quality: Patient reports experiencing a dull pain to affected area(s). Severity: Patient states wound are getting worse. Duration: Patient has had the wound for > 3 months prior to seeking treatment at the wound center Timing: Pain in wound is Intermittent (comes and goes Context: The wound occurred when the patient had a ORIF of the left ankle in September 2016 Modifying Factors: Other treatment(s) tried include: local care as prescribed by the orthopedic surgery group Associated Signs and Symptoms: Patient reports  having increase swelling. 50 year old patient who has development delays and has subnormal mental acuity, comes along with her caregivers with a nonhealing wound to the left lateral ankle which has been there for about 2-3 months. She had a ORIF of the left ankle in September 2016 and during follow-up visits was noted to have drainage and cellulitis from the wound which has been nonhealing. X-rays done in the orthopedic department have been normal as per the patient's caregivers but no reports are available. Past medical history significant for development delays, major depressive disorder, seizure disorder, status post hysterectomy, status post open reduction and internal fixation of bimalleolar left ankle fracture dislocation in September 2016. 10/12/2015 -- they have not received the RTD but have Hydrofera Blue sent to them. 11/09/2015  -- 3 days ago she injured her foot against a chair and has had ecchymosis hematoma and a fresh wound which was a little superior to the one which was almost healed. The patient was really close to discharge but now has had another wound. He has been on doxycycline. JAQUESHA, BOTTGER ([id number]) Objective Constitutional Pulse regular. Respirations normal and unlabored. Afebrile. Vitals Time Taken: 9:18 AM, Temperature: 97.8 F, Pulse: 78 bpm, Blood Pressure: 102/69 mmHg. Eyes Nonicteric. Reactive to light. Ears, Nose, Mouth, and Throat Lips, teeth, and gums WNL.Marland Kitchen Moist mucosa without lesions. Neck supple and nontender. No palpable supraclavicular or cervical adenopathy. Normal sized without goiter. Respiratory WNL. No retractions.. Cardiovascular Pedal Pulses WNL. No clubbing, cyanosis or edema. Lymphatic No adneopathy. No adenopathy. No adenopathy. Musculoskeletal Adexa without tenderness or enlargement.. Digits and nails w/o clubbing, cyanosis, infection, petechiae, ischemia, or inflammatory conditions.Marland Kitchen Psychiatric Judgement and insight Intact.. No evidence of depression, anxiety, or agitation.. General Notes: due to the injury she has got a fresh wound and there is a lot of ecchymosis and surrounding hematoma. Debris over the wounds was washed away with moist saline gauze and she has much tenderness in this area and will not allow deeper debridement. Integumentary (Hair, Skin) No suspicious lesions. No crepitus or fluctuance. No peri-wound warmth or erythema. No masses.. Wound #2 status is Converted. Original cause of wound was Trauma. The wound is located on the Left,Proximal,Lateral Lower Leg. The wound measures 0.1cm length x 0.1cm width x 0.1cm depth; 0.008cm^2 area and 0.001cm^3 volume. There is no tunneling or undermining noted. There is a medium amount of serosanguineous drainage noted. The wound margin is well defined and not attached to the wound base. There is large  (67-100%) pink granulation within the wound bed. There is a small (1-33%) amount of necrotic tissue within the wound bed including Adherent Slough. The periwound skin appearance exhibited: Localized Edema, Erythema. The periwound skin appearance did not exhibit: Dry/Scaly, Maceration, Moist. The surrounding wound skin color is noted with erythema which is circumferential. The Farquhar, Teairra R. ([id number]) periwound has tenderness on palpation. General Notes: hit leg at workshop, new wound established Wound #3 status is Open. Original cause of wound was Trauma. The wound is located on the Left Lower Leg. The wound measures 5.5cm length x 2.3cm width x 0.1cm depth; 9.935cm^2 area and 0.994cm^3 volume. The wound is limited to skin breakdown. There is no tunneling or undermining noted. There is a medium amount of serosanguineous drainage noted. The wound margin is flat and intact. There is medium (34-66%) pink granulation within the wound bed. There is a small (1-33%) amount of necrotic tissue within the wound bed including Adherent Slough. The periwound skin appearance exhibited: Localized Edema, Ecchymosis. Periwound temperature  was noted as No Abnormality. The periwound has tenderness on palpation. 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 Come back and see me next week. Plan Wound Cleansing: Wound #3 Left Lower Leg: Cleanse wound with mild soap and  water May Shower, gently pat wound dry prior to applying new dressing. Primary Wound Dressing: Wound #3 Left Lower Leg: Aquacel Ag Secondary Dressing: Wound #3 Left Lower Leg: Boardered Foam Dressing RATEEL, PLINE R. ([id number]) Dressing Change Frequency: Wound #3 Left Lower Leg: Change dressing every other day. Follow-up Appointments: Wound #3 Left Lower Leg: Return Appointment in 1 week. Edema Control: Wound #3 Left Lower Leg: Other: - kerlix and coban General Notes: ok for [place] to work. No restrictions from wound care stand point. Due to the new wound and the ecchymosis and hematoma I have recommended Aquacel Ag to be applied over the wound with a secondary dressing and a light Kerlix and Coban to keep in place and changed every second day. Come back and see me next week. Electronic Signature(s) Signed: 11/09/2015 9:59:02 AM By: Christin Fudge MD, FACS Entered By: Christin Fudge on 11/09/2015 09:59:02 ENRICA, BRANNOCK ([id number]) -------------------------------------------------------------------------------- SuperBill Details Patient Name: ANNAKA, COSLEY. Date of Service: 11/09/2015 Medical Record Number: [id number] Patient Account Number: [id number] Date of Birth/Sex: March 29, 1966 (50 y.o. 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 R23.3 Spontaneous ecchymoses Facility Procedures CPT4 Code: [id number] Description: 99213 - WOUND CARE VISIT-LEV 3 EST PT Modifier: Quantity: 1 Physician Procedures CPT4: Description  Modifier Quantity Code [id number] 99213 - WC PHYS LEVEL 3 - EST PT 1 ICD-10 Description Diagnosis T81.31XA Disruption of external operation (surgical) wound, not elsewhere classified, initial encounter L97.322 Non-pressure chronic ulcer of  left ankle with fat layer exposed R23.3 Spontaneous ecchymoses Electronic Signature(s) Signed: 11/09/2015 3:39:32 PM By: Ardean Larsen Signed: 11/09/2015 3:57:47 PM By: Christin Fudge MD, FACS Previous Signature: 11/09/2015 9:59:19 AM Version By: Christin Fudge MD, FACS Entered By: Rebecca Eaton RN, Sendra on 11/09/2015 10:33:57

## 2015-11-16 ENCOUNTER — Encounter: Payer: Medicare Other | Admitting: Surgery

## 2015-11-16 DIAGNOSIS — L97322 Non-pressure chronic ulcer of left ankle with fat layer exposed: Secondary | ICD-10-CM | POA: Diagnosis not present

## 2015-11-16 NOTE — Progress Notes (Signed)
Roberta Hampton, Roberta Hampton ([id number]) Visit Report for 11/16/2015 Arrival Information Details Patient Name: Roberta Hampton, Roberta Hampton 11/16/2015 10:45 Date of Service: AM Medical Record [id number] Number: Patient Account Number: [id number] 05-20-1966 (50 y.o. Treating RN: Macarthur Critchley Date of Birth/Sex: Female) Other Clinician: [facility], Treating Primary Care Physician: Ricky Stabs Physician/Extender: Norville Haggard, Referring Physician: Rivka Safer in Treatment: 8 Visit Information History Since Last Visit All ordered tests and consults were completed: No Patient Arrived: Ambulatory Added or deleted any medications: No Arrival Time: 11:04 Any new allergies or adverse reactions: No Accompanied By: caregiver Had a fall or experienced change in No Transfer Assistance: None activities of daily living that may affect Patient Identification Verified: Yes risk of falls: Secondary Verification Process Yes Signs or symptoms of abuse/neglect since last No Completed: visito Patient Requires Transmission-Based No Hospitalized since last visit: No Precautions: Has Dressing in Place as Prescribed: Yes Patient Has Alerts: No Pain Present Now: No Electronic Signature(s) Signed: 11/16/2015 2:46:25 PM By: Rebecca Eaton, RN, Sendra Entered By: Rebecca Eaton RN, Sendra on 11/16/2015 11:04:43 Roberta Hampton ([id number]) -------------------------------------------------------------------------------- Clinic Level of Care Assessment Details Patient Name: [place]OT[place], EXLEY 11/16/2015 10:45 Date of Service: AM Medical Record [id number] Number: Patient Account Number: [id number] [birth year] (50 y.o. Treating RN: Macarthur Critchley Date of Birth/Sex: Female) Other Clinician: [facility], Treating Primary Care Physician: Ricky Stabs Physician/Extender: Norville Haggard, Referring Physician: Rivka Safer in Treatment: 8 Clinic Level of Care Assessment Items TOOL 4 Quantity Score X  - Use when only an EandM is performed on FOLLOW-UP visit 1 0 ASSESSMENTS - Nursing Assessment / Reassessment X - Reassessment of Co-morbidities (includes updates in patient status) 1 10 X - Reassessment of Adherence to Treatment Plan 1 5 ASSESSMENTS - Wound and Skin Assessment / Reassessment X - Simple Wound Assessment / Reassessment - one wound 1 5 []  - Complex Wound Assessment / Reassessment - multiple wounds 0 []  - Dermatologic / Skin Assessment (not related to wound area) 0 ASSESSMENTS - Focused Assessment X - Circumferential Edema Measurements - multi extremities 1 5 []  - Nutritional Assessment / Counseling / Intervention 0 X - Lower Extremity Assessment (monofilament, tuning fork, pulses) 1 5 []  - Peripheral Arterial Disease Assessment (using hand held doppler) 0 ASSESSMENTS - Ostomy and/or Continence Assessment and Care []  - Incontinence Assessment and Management 0 []  - Ostomy Care Assessment and Management (repouching, etc.) 0 PROCESS - Coordination of Care X - Simple Patient / Family Education for ongoing care 1 15 []  - Complex (extensive) Patient / Family Education for ongoing care 0 X - Staff obtains Consents, Records, Test Results / Process Orders 1 10 Roberta Hampton, Roberta R. ([id number]) X - Staff telephones HHA, Nursing Homes / Clarify orders / etc 1 10 []  - Routine Transfer to another Facility (non-emergent condition) 0 []  - Routine Hospital Admission (non-emergent condition) 0 []  - New Admissions / Biomedical engineer / Ordering NPWT, Apligraf, etc. 0 []  - Emergency Hospital Admission (emergent condition) 0 X - Simple Discharge Coordination 1 10 []  - Complex (extensive) Discharge Coordination 0 PROCESS - Special Needs []  - Pediatric / Minor Patient Management 0 []  - Isolation Patient Management 0 []  - Hearing / Language / Visual special needs 0 []  - Assessment of Community assistance (transportation, D/C planning, etc.) 0 []  - Additional assistance / Altered mentation  0 []  - Support Surface(s) Assessment (bed, cushion, seat, etc.) 0 INTERVENTIONS - Wound Cleansing / Measurement X - Simple Wound Cleansing - one wound 1 5 []  - Complex Wound Cleansing - multiple wounds 0  X - Wound Imaging (photographs - any number of wounds) 1 5 []  - Wound Tracing (instead of photographs) 0 X - Simple Wound Measurement - one wound 1 5 []  - Complex Wound Measurement - multiple wounds 0 INTERVENTIONS - Wound Dressings X - Small Wound Dressing one or multiple wounds 1 10 []  - Medium Wound Dressing one or multiple wounds 0 []  - Large Wound Dressing one or multiple wounds 0 []  - Application of Medications - topical 0 Roberta Hampton, Roberta R. ([id number]) []  - Application of Medications - injection 0 INTERVENTIONS - Miscellaneous []  - External ear exam 0 []  - Specimen Collection (cultures, biopsies, blood, body fluids, etc.) 0 []  - Specimen(s) / Culture(s) sent or taken to Lab for analysis 0 []  - Patient Transfer (multiple staff / Civil Service fast streamer / Similar devices) 0 []  - Simple Staple / Suture removal (25 or less) 0 []  - Complex Staple / Suture removal (26 or more) 0 []  - Hypo / Hyperglycemic Management (close monitor of Blood Glucose) 0 []  - Ankle / Brachial Index (ABI) - do not check if billed separately 0 X - Vital Signs 1 5 Has the patient been seen at the hospital within the last three years: Yes Total Score: 105 Level Of Care: New/Established - Level 3 Electronic Signature(s) Signed: 11/16/2015 2:46:25 PM By: Rebecca Eaton, RN, Sendra Entered By: Rebecca Eaton RN, Sendra on 11/16/2015 11:40:58 Roberta Hampton ([id number]) -------------------------------------------------------------------------------- Encounter Discharge Information Details Patient Name: Roberta Hampton, Roberta Hampton 11/16/2015 10:45 Date of Service: AM Medical Record [id number] Number: Patient Account Number: [id number] [birth year] (50 y.o. Treating RN: Macarthur Critchley Date of Birth/Sex: Female) Other  Clinician: [facility], Treating Primary Care Physician: Ricky Stabs Physician/Extender: Norville Haggard, Referring Physician: Rivka Safer in Treatment: 8 Encounter Discharge Information Items Facility Notification Discharge Pain Level: 0 Facility Type: Home Health Discharge Condition: Stable Orders Sent: Yes Ambulatory Status: Ambulatory Discharge Destination: Home Transportation: Private Auto Accompanied By: caregiver Schedule Follow-up Appointment: Yes Medication Reconciliation completed and provided to Patient/Care Yes Enoch Moffa: Provided on Clinical Summary of Care: 11/16/2015 Form Type Recipient Paper Patient CC Electronic Signature(s) Signed: 11/16/2015 2:46:25 PM By: Rebecca Eaton RN, Sendra Previous Signature: 11/16/2015 11:29:28 AM Version By: Sharon Mt Entered By: Rebecca Eaton RN, Sendra on 11/16/2015 11:44:50 Roberta Hampton ([id number]) -------------------------------------------------------------------------------- Lower Extremity Assessment Details Patient Name: Roberta Hampton, Roberta Hampton 11/16/2015 10:45 Date of Service: AM Medical Record [id number] Number: Patient Account Number: [id number] Sep 26, 1965 (50 y.o. Treating RN: Macarthur Critchley Date of Birth/Sex: Female) Other Clinician: [facility], Treating Primary Care Physician: Ricky Stabs Physician/Extender: Norville Haggard, Referring Physician: Rivka Safer in Treatment: 8 Edema Assessment Assessed: [Left: No] [Right: No] Edema: [Left: N] [Right: o] Vascular Assessment Pulses: Posterior Tibial Dorsalis Pedis Palpable: [Left:Yes] Extremity colors, hair growth, and conditions: Extremity Color: [Left:Normal] Hair Growth on Extremity: [Left:Yes] Temperature of Extremity: [Left:Warm] Capillary Refill: [Left:< 3 seconds] Toe Nail Assessment Left: Right: Thick: No Discolored: No Deformed: No Improper Length and Hygiene: No Electronic Signature(s) Signed: 11/16/2015 2:46:25 PM By:  Rebecca Eaton, RN, Sendra Entered By: Rebecca Eaton RN, Sendra on 11/16/2015 11:08:52 Roberta Hampton ([id number]) -------------------------------------------------------------------------------- Pain Assessment Details Patient Name: Roberta Hampton, Roberta Hampton 11/16/2015 10:45 Date of Service: AM Medical Record [id number] Number: Patient Account Number: [id number] 1965/12/06 (50 y.o. Treating RN: Macarthur Critchley Date of Birth/Sex: Female) Other Clinician: [facility], Treating Primary Care Physician: Ricky Stabs Physician/Extender: Norville Haggard, Referring Physician: Rivka Safer in Treatment: 8 Active Problems Location of Pain Severity and Description of Pain Patient Has Paino No Site Locations Rate the pain. Current Pain Level: 0 Pain Management  and Medication Current Pain Management: Electronic Signature(s) Signed: 11/16/2015 2:46:25 PM By: Rebecca Eaton, RN, Sendra Entered By: Rebecca Eaton RN, Sendra on 11/16/2015 11:09:29 Roberta Hampton, Roberta Hampton ([id number]) -------------------------------------------------------------------------------- Patient/Caregiver Education Details Patient Name: Roberta Hampton, Roberta Hampton 11/16/2015 10:45 Date of Service: AM Medical Record [id number] Number: Patient Account Number: [id number] [birth year] (50 y.o. Treating RN: Macarthur Critchley Date of Birth/Gender: Female) Other Clinician: [facility], Treating Primary Care Physician: Ricky Stabs Physician/Extender: Wynn Banker in Treatment: 8 Referring Physician: [facility] Education Assessment Education Provided To: Patient Education Topics Provided Wound/Skin Impairment: Handouts: Caring for Your Ulcer, Skin Care Do's and Dont's Methods: Explain/Verbal Responses: Reinforcements needed Electronic Signature(s) Signed: 11/16/2015 2:46:25 PM By: Rebecca Eaton RN, Sendra Entered By: Rebecca Eaton RN, Sendra on 11/16/2015 11:44:15 Roberta Hampton  ([id number]) -------------------------------------------------------------------------------- Wound Assessment Details Patient Name: Roberta Hampton, Roberta Hampton 11/16/2015 10:45 Date of Service: AM Medical Record [id number] Number: Patient Account Number: [id number] 02-09-1966 (50 y.o. Treating RN: Macarthur Critchley Date of Birth/Sex: Female) Other Clinician: [facility], Treating Primary Care Physician: Ricky Stabs Physician/Extender: Norville Haggard, Referring Physician: Rivka Safer in Treatment: 8 Wound Status Wound Number: 3 Primary Etiology: Trauma, Other Wound Location: Left Lower Leg Wound Status: Open Wounding Event: Trauma Comorbid History: Seizure Disorder Date Acquired: 11/06/2015 Weeks Of Treatment: 1 Clustered Wound: No Photos Photo Uploaded By: Rebecca Eaton, RN, Roslynn Amble on 11/16/2015 14:24:55 Wound Measurements Length: (cm) 4 Width: (cm) 0.2 Depth: (cm) 0.1 Area: (cm) 0.628 Volume: (cm) 0.063 % Reduction in Area: 93.7% % Reduction in Volume: 93.7% Epithelialization: Large (67-100%) Tunneling: No Undermining: No Wound Description Classification: Partial Thickness Foul Odor After Wound Margin: Flat and Intact Exudate Amount: Small Exudate Type: Serosanguineous Exudate Color: red, brown Cleansing: No Wound Bed Granulation Amount: Large (67-100%) Exposed Structure Roberta Hampton, Roberta Hampton R. ([id number]) Granulation Quality: Pink Fascia Exposed: No Necrotic Amount: Small (1-33%) Fat Layer Exposed: No Necrotic Quality: Adherent Slough Tendon Exposed: No Muscle Exposed: No Joint Exposed: No Bone Exposed: No Limited to Skin Breakdown Periwound Skin Texture Texture Color No Abnormalities Noted: No No Abnormalities Noted: No Localized Edema: Yes Ecchymosis: Yes Moisture Temperature / Pain No Abnormalities Noted: No Temperature: No Abnormality Tenderness on Palpation: Yes Wound Preparation Ulcer Cleansing: Rinsed/Irrigated with Saline Topical Anesthetic  Applied: None Treatment Notes Wound #3 (Left Lower Leg) 1. Cleansed with: Clean wound with Normal Saline 4. Dressing Applied: Aquacel Ag 5. Secondary Dressing Applied Bordered Foam Dressing 7. Secured with Other (specify in notes) Notes kerlix and Event organiser) Signed: 11/16/2015 2:46:25 PM By: Rebecca Eaton, RN, Sendra Entered By: Rebecca Eaton RN, Sendra on 11/16/2015 11:09:17 GENI, LEEMON ([id number]) -------------------------------------------------------------------------------- Vitals Details Patient Name: ANNMARIE, CHEREK 11/16/2015 10:45 Date of Service: AM Medical Record [id number] Number: Patient Account Number: [id number] 09/18/1965 (50 y.o. Treating RN: Macarthur Critchley Date of Birth/Sex: Female) Other Clinician: [facility], Treating Primary Care Physician: Ricky Stabs Physician/Extender: Norville Haggard, Referring Physician: Rivka Safer in Treatment: 8 Vital Signs Time Taken: 10:50 Pulse (bpm): 84 Blood Pressure (mmHg): 102/64 Reference Range: 80 - 120 mg / dl Electronic Signature(s) Signed: 11/16/2015 2:46:25 PM By: Rebecca Eaton RN, Sendra Entered By: Rebecca Eaton RN, Sendra on 11/16/2015 11:39:48

## 2015-11-16 NOTE — Progress Notes (Addendum)
KIHARA, SAADAT ([id number]) Visit Report for 11/16/2015 Chief Complaint Document Details Patient Name: Roberta Hampton, Roberta Hampton 11/16/2015 10:45 Date of Service: AM Medical Record [id number] Number: Patient Account Number: [id number] 1966-07-29 (50 y.o. Treating RN: Macarthur Critchley Date of Birth/Sex: Female) Other Clinician: [facility], Treating Primary Care Physician: Ricky Stabs Physician/Extender: Norville Haggard, Referring Physician: Rivka Safer in Treatment: 8 Information Obtained from: Caregiver Chief Complaint Patient presents to the wound care center with open non-healing surgical wound which she's had for about 2 months Electronic Signature(s) Signed: 11/16/2015 11:38:33 AM By: Christin Fudge MD, FACS Entered By: Christin Fudge on 11/16/2015 11:38:33 GWYNETTE, BEGG ([id number]) -------------------------------------------------------------------------------- HPI Details Patient Name: Roberta Hampton, Roberta Hampton 11/16/2015 10:45 Date of Service: AM Medical Record [id number] Number: Patient Account Number: [id number] 1966/04/20 (50 y.o. Treating RN: Macarthur Critchley Date of Birth/Sex: Female) Other Clinician: [facility], Treating Primary Care Physician: Ricky Stabs Physician/Extender: Norville Haggard, Referring Physician: Rivka Safer in Treatment: 8 History of Present Illness Location: left ankle area Quality: Patient reports experiencing a dull pain to affected area(s). Severity: Patient states wound are getting worse. Duration: Patient has had the wound for > 3 months prior to seeking treatment at the wound center Timing: Pain in wound is Intermittent (comes and goes Context: The wound occurred when the patient had a ORIF of the left ankle in September 2016 Modifying Factors: Other treatment(s) tried include: local care as prescribed by the orthopedic surgery group Associated Signs and Symptoms: Patient reports having increase swelling. HPI  Description: 50 year old patient who has development delays and has subnormal mental acuity, comes along with her caregivers with a nonhealing wound to the left lateral ankle which has been there for about 2-3 months. She had a ORIF of the left ankle in September 2016 and during follow-up visits was noted to have drainage and cellulitis from the wound which has been nonhealing. X-rays done in the orthopedic department have been normal as per the patient's caregivers but no reports are available. Past medical history significant for development delays, major depressive disorder, seizure disorder, status post hysterectomy, status post open reduction and internal fixation of bimalleolar left ankle fracture dislocation in September 2016. 10/12/2015 -- they have not received the RTD but have Hydrofera Blue sent to them. 11/09/2015 -- 3 days ago she injured her foot against a chair and has had ecchymosis hematoma and a fresh wound which was a little superior to the one which was almost healed. The patient was really close to discharge but now has had another wound. He has been on doxycycline. Electronic Signature(s) Signed: 11/16/2015 11:38:40 AM By: Christin Fudge MD, FACS Entered By: Christin Fudge on 11/16/2015 11:38:39 ISZABELLA, TRULL ([id number]) -------------------------------------------------------------------------------- Physical Exam Details Patient Name: Roberta Hampton, Roberta Hampton 11/16/2015 10:45 Date of Service: AM Medical Record [id number] Number: Patient Account Number: [id number] [birth year] (50 y.o. Treating RN: Macarthur Critchley Date of Birth/Sex: Female) Other Clinician: [facility], Treating Primary Care Physician: Ricky Stabs Physician/Extender: Norville Haggard, Referring Physician: Rivka Safer in Treatment: 8 Constitutional . Pulse regular. Respirations normal and unlabored. Afebrile. . Eyes Nonicteric. Reactive to light. Ears, Nose, Mouth, and Throat Lips,  teeth, and gums WNL.Marland Kitchen Moist mucosa without lesions. Neck supple and nontender. No palpable supraclavicular or cervical adenopathy. Normal sized without goiter. Respiratory WNL. No retractions.. Cardiovascular Pedal Pulses WNL. No clubbing, cyanosis or edema. Lymphatic No adneopathy. No adenopathy. No adenopathy. Musculoskeletal Adexa without tenderness or enlargement.. Digits and nails w/o clubbing, cyanosis, infection, petechiae, ischemia, or inflammatory conditions.. Integumentary (Hair, Skin) No suspicious lesions. No crepitus or  fluctuance. No peri-wound warmth or erythema. No masses.Marland Kitchen Psychiatric Judgement and insight Intact.. No evidence of depression, anxiety, or agitation.. Notes the wound which had taken a very bad turn last week has made her 100% turnaround and all the ecchymosis is gotten down the edema has gone down and the wound looks excellent. It is been clean with healthy granulation tissue and no debridement was required today. Electronic Signature(s) Signed: 11/16/2015 11:39:17 AM By: Christin Fudge MD, FACS Entered By: Christin Fudge on 11/16/2015 11:39:16 Roberta Hampton, Roberta Hampton ([id number]) -------------------------------------------------------------------------------- Physician Orders Details Patient Name: Roberta Hampton, Roberta Hampton 11/16/2015 10:45 Date of Service: AM Medical Record [id number] Number: Patient Account Number: [id number] 02/09/1966 (50 y.o. Treating RN: Macarthur Critchley Date of Birth/Sex: Female) Other Clinician: [facility], Treating Primary Care Physician: Ricky Stabs Physician/Extender: Norville Haggard, Referring Physician: Rivka Safer in Treatment: 8 Verbal / Phone Orders: Yes Clinician: Macarthur Critchley Read Back and Verified: Yes Diagnosis Coding Wound Cleansing Wound #3 Left Lower Leg o Cleanse wound with mild soap and water o May Shower, gently pat wound dry prior to applying new dressing. Primary Wound Dressing Wound #3  Left Lower Leg o Aquacel Ag Secondary Dressing Wound #3 Left Lower Leg o Boardered Foam Dressing Dressing Change Frequency Wound #3 Left Lower Leg o Change dressing every other day. Follow-up Appointments Wound #3 Left Lower Leg o Return Appointment in 1 week. Edema Control Wound #3 Left Lower Leg o Other: - kerlix and Manson Wound #3 Left Lower Leg o [place] Visits - see physicians order o [place] Nurse may visit PRN to address patientos wound care needs. IDELLA, BISWELL ([id number]) Electronic Signature(s) Signed: 11/16/2015 2:46:25 PM By: Ardean Larsen Signed: 11/16/2015 4:11:43 PM By: Christin Fudge MD, FACS Entered By: Rebecca Eaton RN, Sendra on 11/16/2015 11:27:43 DESIRAY, KRUIZENGA ([id number]) -------------------------------------------------------------------------------- Problem List Details Patient Name: Roberta Hampton, Roberta Hampton 11/16/2015 10:45 Date of Service: AM Medical Record [id number] Number: Patient Account Number: [id number] March 29, 1966 (50 y.o. 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 Treating RN: Macarthur Critchley Date of Birth/Sex: Female) Other Clinician: [facility], Treating Primary Care Physician: Ricky Stabs Physician/Extender: Norville Haggard, Referring Physician: Rivka Safer in Treatment: 8 Subjective Chief Complaint Information obtained from Caregiver Patient presents to the wound care center with open non-healing surgical wound which she's had for about 2 months History of Present Illness (HPI) The following HPI elements were documented for the patient's wound: Location: left ankle area Quality: Patient reports experiencing a dull pain to affected area(s). Severity: Patient states wound are getting worse. Duration: Patient has had the wound for > 3 months prior to seeking treatment at the wound center Timing: Pain in wound is Intermittent (comes and goes Context: The wound occurred when the patient had a ORIF of the left ankle in September  2016 Modifying Factors: Other treatment(s) tried include: local care as prescribed by the orthopedic surgery group Associated Signs and Symptoms: Patient reports having increase swelling. 50 year old patient who has development delays and has subnormal mental acuity, comes along with her caregivers with a nonhealing wound to the left lateral ankle which has been there for about 2-3 months. She had a ORIF of the left ankle in September 2016 and during follow-up visits was noted to have drainage and cellulitis from the wound which has been nonhealing. X-rays done in the orthopedic department have been normal as per the patient's caregivers but no reports are available. Past medical history significant for development delays, major  depressive disorder, seizure disorder, status post hysterectomy, status post open reduction and internal fixation of bimalleolar left ankle fracture dislocation in September 2016. 10/12/2015 -- they have not received the RTD but have Hydrofera Blue sent to them. 11/09/2015 -- 3 days ago she injured her foot against a chair and has had ecchymosis hematoma and a fresh wound which was a little superior to the one which was almost healed. The patient was really close to discharge but now has had another wound. He has been on doxycycline. Roberta Hampton, Roberta Hampton ([id number]) Objective Constitutional Pulse regular. Respirations normal and unlabored. Afebrile. Vitals Time Taken: 10:50 AM, Pulse: 84 bpm, Blood Pressure: 102/64 mmHg. Eyes Nonicteric. Reactive to light. Ears, Nose, Mouth, and Throat Lips, teeth, and gums WNL.Marland Kitchen Moist mucosa without lesions. Neck supple and nontender. No palpable supraclavicular or cervical adenopathy. Normal sized without goiter. Respiratory WNL. No retractions.. Cardiovascular Pedal Pulses WNL. No clubbing, cyanosis or edema. Lymphatic No adneopathy. No adenopathy. No adenopathy. Musculoskeletal Adexa without tenderness or enlargement.. Digits and nails w/o clubbing, cyanosis, infection, petechiae, ischemia, or inflammatory conditions.Marland Kitchen Psychiatric Judgement and insight Intact.. No evidence of depression, anxiety, or agitation.. General Notes: the wound which had taken a very bad turn last week has made her 100% turnaround and all the ecchymosis is gotten down the edema has gone down and the wound looks excellent. It is been clean with healthy granulation tissue and no debridement was required today. Integumentary (Hair, Skin) No suspicious lesions. No crepitus or fluctuance. No peri-wound warmth or erythema. No masses.. Wound #3 status is Open. Original cause of wound was Trauma. The wound is located on the Left Lower Leg. The wound measures 4cm length x 0.2cm  width x 0.1cm depth; 0.628cm^2 area and 0.063cm^3 volume. The wound is limited to skin breakdown. There is no tunneling or undermining noted. There is a Roberta Hampton, Roberta R. ([id number]) small amount of serosanguineous drainage noted. The wound margin is flat and intact. There is large (67- 100%) pink granulation within the wound bed. There is a small (1-33%) amount of necrotic tissue within the wound bed including Adherent Slough. The periwound skin appearance exhibited: Localized Edema, Ecchymosis. Periwound temperature was noted as No Abnormality. The periwound has tenderness on palpation. 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 Primary Wound Dressing: Wound #3 Left Lower Leg: Aquacel Ag Secondary Dressing: Wound #3 Left Lower Leg: Boardered Foam Dressing Dressing Change Frequency: Wound #3 Left Lower Leg: Change dressing every other day. Follow-up Appointments: Wound #  3 Left Lower Leg: Return Appointment in 1 week. Edema Control: Wound #3 Left Lower Leg: Other: - kerlix and coban Home Health: Wound #3 Left Lower Leg: Continue Home Health Visits - see physicians order Home Health Nurse may visit PRN to address patient s wound care needs. Roberta Hampton, Roberta Hampton ([id number]) I have recommended Aquacel Ag to be applied over the wound with a secondary dressing and a light Kerlix and Coban to keep in place and changed every second day. Come back and see me next week. Electronic Signature(s) Signed: 11/16/2015 4:16:35 PM By: Christin Fudge MD, FACS Previous Signature:  11/16/2015 11:39:31 AM Version By: Christin Fudge MD, FACS Entered By: Christin Fudge on 11/16/2015 16:16:35 Roberta Hampton, Roberta Hampton ([id number]) -------------------------------------------------------------------------------- SuperBill Details Patient Name: RUBAB, LAUDANO. Date of Service: 11/16/2015 Medical Record Number: [id number] Patient Account Number: [id number] Date of Birth/Sex: Sep 13, 1965 (50 y.o. 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 R23.3 Spontaneous ecchymoses Facility Procedures CPT4 Code: [id number] Description: 99213 - WOUND CARE VISIT-LEV 3 EST PT Modifier: Quantity: 1 Physician Procedures CPT4: Description Modifier Quantity Code [id number] 99213 - WC PHYS LEVEL 3 - EST PT 1 ICD-10 Description Diagnosis T81.31XA Disruption of external operation (surgical) wound, not elsewhere classified, initial encounter F81.9 Developmental disorder of  scholastic skills, unspecified R23.3 Spontaneous ecchymoses L97.322 Non-pressure chronic ulcer of left ankle with fat layer exposed Electronic Signature(s) Signed: 11/16/2015 2:46:25 PM By: Ardean Larsen Signed: 11/16/2015 4:11:43 PM By: Christin Fudge MD, FACS Previous Signature: 11/16/2015 11:39:48 AM Version By: Christin Fudge MD, FACS Entered By: Rebecca Eaton RN, Sendra on 11/16/2015 11:41:07

## 2015-11-23 ENCOUNTER — Encounter: Payer: Medicare Other | Admitting: Surgery

## 2015-11-23 DIAGNOSIS — L97322 Non-pressure chronic ulcer of left ankle with fat layer exposed: Secondary | ICD-10-CM | POA: Diagnosis not present

## 2015-11-24 NOTE — Progress Notes (Signed)
BETHANN, KAWA ([id number]) Visit Report for 11/23/2015 Arrival Information Details Patient Name: Roberta Hampton, Roberta Hampton 11/23/2015 10:15 Date of Service: AM Medical Record [id number] Number: Patient Account Number: [id number] November 19, 1965 (50 y.o. Treating RN: Macarthur Critchley Date of Birth/Sex: Female) Other Clinician: [facility], Treating Primary Care Physician: Ricky Stabs Physician/Extender: Norville Haggard, Referring Physician: Rivka Safer in Treatment: 9 Visit Information History Since Last Visit All ordered tests and consults were completed: No Patient Arrived: Ambulatory Added or deleted any medications: No Arrival Time: 10:09 Any new allergies or adverse reactions: No Accompanied By: caregiver Had a fall or experienced change in No Transfer Assistance: None activities of daily living that may affect Patient Identification Verified: Yes risk of falls: Secondary Verification Process Yes Signs or symptoms of abuse/neglect since last No Completed: visito Patient Requires Transmission-Based No Hospitalized since last visit: No Precautions: Has Dressing in Place as Prescribed: Yes Patient Has Alerts: No Pain Present Now: No Electronic Signature(s) Signed: 11/23/2015 4:16:25 PM By: Rebecca Eaton, RN, Sendra Entered By: Rebecca Eaton RN, Sendra on 11/23/2015 10:10:46 [name] ([id number]) -------------------------------------------------------------------------------- Clinic Level of Care Assessment Details Patient Name: Roberta, Hampton 11/23/2015 10:15 Date of Service: AM Medical Record [id number] Number: Patient Account Number: [id number] [birth year] (50 y.o. Treating RN: Macarthur Critchley Date of Birth/Sex: Female) Other Clinician: [facility], Treating Primary Care Physician: Ricky Stabs Physician/Extender: Norville Haggard, Referring Physician: Rivka Safer in Treatment: 9 Clinic Level of Care Assessment Items TOOL 4 Quantity Score X  - Use when only an EandM is performed on FOLLOW-UP visit 1 0 ASSESSMENTS - Nursing Assessment / Reassessment X - Reassessment of Co-morbidities (includes updates in patient status) 1 10 X - Reassessment of Adherence to Treatment Plan 1 5 ASSESSMENTS - Wound and Skin Assessment / Reassessment X - Simple Wound Assessment / Reassessment - one wound 1 5 []  - Complex Wound Assessment / Reassessment - multiple wounds 0 []  - Dermatologic / Skin Assessment (not related to wound area) 0 ASSESSMENTS - Focused Assessment []  - Circumferential Edema Measurements - multi extremities 0 []  - Nutritional Assessment / Counseling / Intervention 0 X - Lower Extremity Assessment (monofilament, tuning fork, pulses) 1 5 []  - Peripheral Arterial Disease Assessment (using hand held doppler) 0 ASSESSMENTS - Ostomy and/or Continence Assessment and Care []  - Incontinence Assessment and Management 0 []  - Ostomy Care Assessment and Management (repouching, etc.) 0 PROCESS - Coordination of Care X - Simple Patient / Family Education for ongoing care 1 15 []  - Complex (extensive) Patient / Family Education for ongoing care 0 X - Staff obtains Consents, Records, Test Results / Process Orders 1 10 [name], [name] ([id number]) X - Staff telephones HHA, Nursing Homes / Clarify orders / etc 1 10 []  - Routine Transfer to another Facility (non-emergent condition) 0 []  - Routine Hospital Admission (non-emergent condition) 0 []  - New Admissions / Biomedical engineer / Ordering NPWT, Apligraf, etc. 0 []  - Emergency Hospital Admission (emergent condition) 0 X - Simple Discharge Coordination 1 10 []  - Complex (extensive) Discharge Coordination 0 PROCESS - Special Needs []  - Pediatric / Minor Patient Management 0 []  - Isolation Patient Management 0 []  - Hearing / Language / Visual special needs 0 []  - Assessment of Community assistance (transportation, D/C planning, etc.) 0 []  - Additional assistance / Altered mentation  0 []  - Support Surface(s) Assessment (bed, cushion, seat, etc.) 0 INTERVENTIONS - Wound Cleansing / Measurement X - Simple Wound Cleansing - one wound 1 5 []  - Complex Wound Cleansing - multiple wounds 0 X -  Wound Imaging (photographs - any number of wounds) 1 5 []  - Wound Tracing (instead of photographs) 0 X - Simple Wound Measurement - one wound 1 5 []  - Complex Wound Measurement - multiple wounds 0 INTERVENTIONS - Wound Dressings X - Small Wound Dressing one or multiple wounds 1 10 []  - Medium Wound Dressing one or multiple wounds 0 []  - Large Wound Dressing one or multiple wounds 0 []  - Application of Medications - topical 0 RAEVAN, DELIO R. ([id number]) []  - Application of Medications - injection 0 INTERVENTIONS - Miscellaneous []  - External ear exam 0 []  - Specimen Collection (cultures, biopsies, blood, body fluids, etc.) 0 []  - Specimen(s) / Culture(s) sent or taken to Lab for analysis 0 []  - Patient Transfer (multiple staff / Civil Service fast streamer / Similar devices) 0 []  - Simple Staple / Suture removal (25 or less) 0 []  - Complex Staple / Suture removal (26 or more) 0 []  - Hypo / Hyperglycemic Management (close monitor of Blood Glucose) 0 []  - Ankle / Brachial Index (ABI) - do not check if billed separately 0 X - Vital Signs 1 5 Has the patient been seen at the hospital within the last three years: Yes Total Score: 100 Level Of Care: New/Established - Level 3 Electronic Signature(s) Signed: 11/23/2015 4:16:25 PM By: Rebecca Eaton, RN, Sendra Entered By: Rebecca Eaton RN, Sendra on 11/23/2015 10:45:42 [name] ([id number]) -------------------------------------------------------------------------------- Encounter Discharge Information Details Patient Name: Roberta, Hampton 11/23/2015 10:15 Date of Service: AM Medical Record [id number] Number: Patient Account Number: [id number] [birth year] (50 y.o. Treating RN: Macarthur Critchley Date of Birth/Sex: Female) Other  Clinician: [facility], Treating Primary Care Physician: Ricky Stabs Physician/Extender: Norville Haggard, Referring Physician: Rivka Safer in Treatment: 9 Encounter Discharge Information Items Facility Notification Discharge Pain Level: 0 Facility Type: Home Health Discharge Condition: Stable Orders Sent: Yes Ambulatory Status: Ambulatory Discharge Destination: Home Transportation: Private Auto Accompanied By: caregiver Schedule Follow-up Appointment: Yes Medication Reconciliation completed and provided to Patient/Care Yes Ellin Fitzgibbons: Provided on Clinical Summary of Care: 11/23/2015 Form Type Recipient Paper Patient CC Electronic Signature(s) Signed: 11/23/2015 10:43:58 AM By: Ruthine Dose Entered By: Ruthine Dose on 11/23/2015 10:43:58 [name] ([id number]) -------------------------------------------------------------------------------- Lower Extremity Assessment Details Patient Name: DAMANI, BARDIN 11/23/2015 10:15 Date of Service: AM Medical Record [id number] Number: Patient Account Number: [id number] 06-Jun-1966 (50 y.o. Treating RN: Macarthur Critchley Date of Birth/Sex: Female) Other Clinician: [facility], Treating Primary Care Physician: Ricky Stabs Physician/Extender: Norville Haggard, Referring Physician: Rivka Safer in Treatment: 9 Edema Assessment Assessed: [Left: No] [Right: No] Edema: [Left: N] [Right: o] Vascular Assessment Pulses: Posterior Tibial Dorsalis Pedis Palpable: [Left:Yes] Extremity colors, hair growth, and conditions: Extremity Color: [Left:Mottled] Hair Growth on Extremity: [Left:Yes] Temperature of Extremity: [Left:Warm] Capillary Refill: [Left:< 3 seconds] Toe Nail Assessment Left: Right: Thick: No Discolored: No Deformed: No Improper Length and Hygiene: No Electronic Signature(s) Signed: 11/23/2015 4:16:25 PM By: Rebecca Eaton, RN, Sendra Entered By: Rebecca Eaton RN, Sendra on 11/23/2015 10:19:43 [name] ([id number]) -------------------------------------------------------------------------------- Pain Assessment Details Patient Name: NEOMIA, CREED 11/23/2015 10:15 Date of Service: AM Medical Record [id number] Number: Patient Account Number: [id number] 16-Mar-1966 (50 y.o. Treating RN: Macarthur Critchley Date of Birth/Sex: Female) Other Clinician: [facility], Treating Primary Care Physician: Ricky Stabs Physician/Extender: Norville Haggard, Referring Physician: Rivka Safer in Treatment: 9 Active Problems Location of Pain Severity and Description of Pain Patient Has Paino No Site Locations Rate the pain. Current Pain Level: 0 Pain Management and Medication Current Pain Management: Electronic Signature(s) Signed: 11/23/2015 4:16:25 PM By: Rebecca Eaton,  RN, Roslynn Amble Entered By: Rebecca Eaton, RN, Sendra on 11/23/2015 10:20:34 [name] ([id number]) -------------------------------------------------------------------------------- Patient/Caregiver Education Details Patient Name: MANJIT, STIDD 11/23/2015 10:15 Date of Service: AM Medical Record [id number] Number: Patient Account Number: [id number] 04-10-1966 (50 y.o. Treating RN: Macarthur Critchley Date of Birth/Gender: Female) Other Clinician: [facility], Treating Primary Care Physician: Ricky Stabs Physician/Extender: Wynn Banker in Treatment: 9 Referring Physician: Jefm Bryant Education Assessment Education Provided To: Patient Education Topics Provided Wound/Skin Impairment: Handouts: Caring for Your Ulcer, Skin Care Do's and Dont's Methods: Explain/Verbal Responses: Reinforcements needed Electronic Signature(s) Signed: 11/23/2015 4:16:25 PM By: Rebecca Eaton, RN, Sendra Entered By: Rebecca Eaton RN, Sendra on 11/23/2015 10:22:48 DORANNA, CZERWONKA ([id number]) -------------------------------------------------------------------------------- Wound Assessment Details Patient Name: JOHNEE, LORAH 11/23/2015 10:15 Date of Service: AM Medical Record [id number] Number: Patient Account Number: [id number] 1966-08-22 (50 y.o. Treating RN: Macarthur Critchley Date of Birth/Sex: Female) Other Clinician: [facility], Treating Primary Care Physician: Ricky Stabs Physician/Extender: Norville Haggard, Referring Physician: Rivka Safer in Treatment: 9 Wound Status Wound Number: 3 Primary Etiology: Trauma, Other Wound Location: Left Lower Leg Wound Status: Open Wounding Event: Trauma Comorbid History: Seizure Disorder Date Acquired: 11/06/2015 Weeks Of Treatment: 2 Clustered Wound: No Photos Photo Uploaded By: Rebecca Eaton RN, Roslynn Amble on 11/23/2015 13:50:05 Wound Measurements Length: (cm) 2 Width: (cm) 0.2 Depth: (cm) 0.1 Area: (cm) 0.314 Volume: (cm) 0.031 % Reduction in Area: 96.8% % Reduction in Volume: 96.9% Epithelialization: Large (67-100%) Tunneling: No Undermining: No Wound Description Classification: Partial Thickness Foul Odor After Wound Margin: Flat and Intact Exudate Amount: Small Exudate Type: Serosanguineous Exudate Color: red, brown Cleansing: No Wound Bed Granulation Amount: Large (67-100%) Exposed Structure AMORINA, EDSALL R. ([id number]) Granulation Quality: Pink Fascia Exposed: No Necrotic Amount: None Present (0%) Fat Layer Exposed: No Tendon Exposed: No Muscle Exposed: No Joint Exposed: No Bone Exposed: No Limited to Skin Breakdown Periwound Skin Texture Texture Color No Abnormalities Noted: No No Abnormalities Noted: No Localized Edema: Yes Ecchymosis: Yes Moisture Temperature / Pain No Abnormalities Noted: No Temperature: No Abnormality Tenderness on Palpation: Yes Wound Preparation Ulcer Cleansing: Rinsed/Irrigated with Saline Topical Anesthetic Applied: None Treatment Notes Wound #3 (Left Lower Leg) 1. Cleansed with: Clean wound with Normal Saline 5. Secondary Dressing Applied Dry Gauze Notes cutimed  siltec, kerlix and coban Electronic Signature(s) Signed: 11/23/2015 4:16:25 PM By: Rebecca Eaton, RN, Sendra Entered By: Rebecca Eaton RN, Sendra on 11/23/2015 10:19:21 RAECHELLE, FEURTADO ([id number]) -------------------------------------------------------------------------------- Vitals Details Patient Name: NIKITHA, ANNAN 11/23/2015 10:15 Date of Service: AM Medical Record [id number] Number: Patient Account Number: [id number] Jan 13, 1966 (51 y.o. Treating RN: Macarthur Critchley Date of Birth/Sex: Female) Other Clinician: [facility], Treating Primary Care Physician: Ricky Stabs Physician/Extender: Norville Haggard, Referring Physician: Rivka Safer in Treatment: 9 Vital Signs Time Taken: 10:20 Pulse (bpm): 79 Blood Pressure (mmHg): 103/74 Reference Range: 80 - 120 mg / dl Electronic Signature(s) Signed: 11/23/2015 4:16:25 PM By: Rebecca Eaton RN, Sendra Entered By: Rebecca Eaton RN, Sendra on 11/23/2015 10:20:57

## 2015-11-24 NOTE — Progress Notes (Signed)
Roberta Hampton, Roberta Hampton ([id number]) Visit Report for 11/23/2015 Chief Complaint Document Details Patient Name: Roberta Hampton, Roberta Hampton 11/23/2015 10:15 Date of Service: AM Medical Record [id number] Number: Patient Account Number: [id number] 05-24-1966 (50 y.o. Treating RN: Roberta Hampton Date of Birth/Sex: Female) Other Clinician: [facility], Treating Primary Care Physician: Roberta Hampton Physician/Extender: Roberta Hampton, Referring Physician: Rivka Hampton in Treatment: 9 Information Obtained from: Caregiver Chief Complaint Patient presents to the wound care center with open non-healing surgical wound which she's had for about 2 months Electronic Signature(s) Signed: 11/23/2015 10:52:44 AM By: Roberta Fudge MD, FACS Entered By: Roberta Hampton on 11/23/2015 10:52:44 Roberta Hampton, Roberta Hampton ([id number]) -------------------------------------------------------------------------------- HPI Details Patient Name: Roberta Hampton 11/23/2015 10:15 Date of Service: AM Medical Record [id number] Number: Patient Account Number: [id number] 06-01-1966 (50 y.o. Treating RN: Roberta Hampton Date of Birth/Sex: Female) Other Clinician: [facility], Treating Primary Care Physician: Roberta Hampton Physician/Extender: Roberta Hampton, Referring Physician: Rivka Hampton in Treatment: 9 History of Present Illness Location: left ankle area Quality: Patient reports experiencing a dull pain to affected area(s). Severity: Patient states wound are Roberta Hampton worse. Duration: Patient has had the wound for > 3 months prior to seeking treatment at the wound center Timing: Pain in wound is Intermittent (comes and goes Context: The wound occurred when the patient had a ORIF of the left ankle in September 2016 Modifying Factors: Other treatment(s) tried include: local care as prescribed by the orthopedic surgery group Associated Signs and Symptoms: Patient reports having increase swelling. HPI  Description: 50 year old patient who has development delays and has subnormal mental acuity, comes along with her caregivers with a nonhealing wound to the left lateral ankle which has been there for about 2-3 months. She had a ORIF of the left ankle in September 2016 and during follow-up visits was noted to have drainage and cellulitis from the wound which has been nonhealing. X-rays done in the orthopedic department have been normal as per the patient's caregivers but no reports are available. Past medical history significant for development delays, major depressive disorder, seizure disorder, status post hysterectomy, status post open reduction and internal fixation of bimalleolar left ankle fracture dislocation in September 2016. 10/12/2015 -- they have not received the RTD but have Hydrofera Blue sent to them. 11/09/2015 -- 3 days ago she injured her foot against a chair and has had ecchymosis hematoma and a fresh wound which was a little superior to the one which was almost healed. The patient was really close to discharge but now has had another wound. He has been on doxycycline. Electronic Signature(s) Signed: 11/23/2015 10:52:53 AM By: Roberta Fudge MD, FACS Entered By: Roberta Hampton on 11/23/2015 10:52:53 Roberta Hampton, Roberta Hampton ([id number]) -------------------------------------------------------------------------------- Physical Exam Details Patient Name: Roberta Hampton, Roberta Hampton 11/23/2015 10:15 Date of Service: AM Medical Record [id number] Number: Patient Account Number: [id number] 1966/07/10 (50 y.o. Treating RN: Roberta Hampton Date of Birth/Sex: Female) Other Clinician: [facility], Treating Primary Care Physician: Roberta Hampton Physician/Extender: Roberta Hampton, Referring Physician: Rivka Hampton in Treatment: 9 Constitutional . Pulse regular. Respirations normal and unlabored. Afebrile. . Eyes Nonicteric. Reactive to light. Ears, Nose, Mouth, and Throat Lips,  teeth, and gums WNL.Marland Kitchen Moist mucosa without lesions. Neck supple and nontender. No palpable supraclavicular or cervical adenopathy. Normal sized without goiter. Respiratory WNL. No retractions.. Cardiovascular Pedal Pulses WNL. No clubbing, cyanosis or edema. Lymphatic No adneopathy. No adenopathy. No adenopathy. Musculoskeletal Adexa without tenderness or enlargement.. Digits and nails w/o clubbing, cyanosis, infection, petechiae, ischemia, or inflammatory conditions.. Integumentary (Hair, Skin) No suspicious lesions. No crepitus or  fluctuance. No peri-wound warmth or erythema. No masses.Marland Kitchen Psychiatric Judgement and insight Intact.. No evidence of depression, anxiety, or agitation.. Notes the wound is looking excellent, with some epithelization at the edges of the wound and there is no debridement required today Electronic Signature(s) Signed: 11/23/2015 10:53:24 AM By: Roberta Fudge MD, FACS Entered By: Roberta Hampton on 11/23/2015 10:53:24 Roberta Hampton, Roberta Hampton ([id number]) -------------------------------------------------------------------------------- Physician Orders Details Patient Name: Roberta Hampton, Roberta Hampton 11/23/2015 10:15 Date of Service: AM Medical Record [id number] Number: Patient Account Number: [id number] [birth year] (50 y.o. Treating RN: Roberta Hampton Date of Birth/Sex: Female) Other Clinician: [facility], Treating Primary Care Physician: Roberta Hampton Physician/Extender: Roberta Hampton, Referring Physician: Rivka Hampton in Treatment: 9 Verbal / Phone Orders: Yes Clinician: Macarthur Hampton Read Back and Verified: Yes Diagnosis Coding Wound Cleansing Wound #3 Left Lower Leg o May Shower, gently pat wound dry prior to applying new dressing. - keep dressing dry Primary Wound Dressing Wound #3 Left Lower Leg o Other: - Cutimed siltec foam Secondary Dressing o Dry Gauze Dressing Change Frequency Wound #3 Left Lower Leg o Change dressing every  week - only change if dressing gets wet Follow-up Appointments Wound #3 Left Lower Leg o Return Appointment in 1 week. Edema Control Wound #3 Left Lower Leg o Other: - kerlix and [place] Wound #3 Left Lower Leg o [place] Visits - see physicians order o [place] Nurse may visit PRN to address patientos wound care needs. Electronic Signature(s) Signed: 11/23/2015 4:16:25 PM By: Ardean Larsen Signed: 11/23/2015 4:23:25 PM By: Roberta Fudge MD, FACS Roberta Hampton, Roberta Hampton ([id number]) Entered By: Rebecca Eaton RN, [place] on 11/23/2015 10:37:17 Roberta Hampton, Roberta Hampton ([id number]) -------------------------------------------------------------------------------- Problem List Details Patient Name: Roberta Hampton, Roberta Hampton 11/23/2015 10:15 Date of Service: AM Medical Record [id number] Number: Patient Account Number: [id number] [birth year] (50 y.o. 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 Treating RN: Roberta Hampton Date of Birth/Sex: Female) Other Clinician: [facility], Treating Primary Care Physician: Roberta Hampton Physician/Extender: Roberta Hampton, Referring Physician: Rivka Hampton in Treatment: 9 Subjective Chief Complaint Information obtained from Caregiver Patient presents to the wound care center with open non-healing surgical wound which she's had for about 2 months History of Present Illness (HPI) The following HPI elements were documented for the patient's wound: Location: left ankle area Quality: Patient reports experiencing a dull pain to affected area(s). Severity: Patient states wound are Roberta Hampton worse. Duration: Patient has had the wound for > 3 months prior to seeking treatment at the wound center Timing: Pain in wound is Intermittent (comes and goes Context: The wound occurred when the patient had a ORIF of the left ankle in September 2016 Modifying Factors: Other treatment(s) tried include: local care as prescribed by the orthopedic surgery group Associated Signs and Symptoms: Patient reports having increase swelling. 50 year old patient  who has development delays and has subnormal mental acuity, comes along with her caregivers with a nonhealing wound to the left lateral ankle which has been there for about 2-3 months. She had a ORIF of the left ankle in September 2016 and during follow-up visits was noted to have drainage and cellulitis from the wound which has been nonhealing. X-rays done in the orthopedic department have been normal as per the patient's caregivers but no reports are available. Past medical history significant for development delays, major depressive disorder, seizure disorder, status post hysterectomy, status post open reduction and internal fixation of bimalleolar left ankle fracture dislocation in September 2016. 10/12/2015 -- they  have not received the RTD but have Hydrofera Blue sent to them. 11/09/2015 -- 3 days ago she injured her foot against a chair and has had ecchymosis hematoma and a fresh wound which was a little superior to the one which was almost healed. The patient was really close to discharge but now has had another wound. He has been on doxycycline. Roberta Hampton, Roberta Hampton ([id number]) Objective Constitutional Pulse regular. Respirations normal and unlabored. Afebrile. Vitals Time Taken: 10:20 AM, Pulse: 79 bpm, Blood Pressure: 103/74 mmHg. Eyes Nonicteric. Reactive to light. Ears, Nose, Mouth, and Throat Lips, teeth, and gums WNL.Marland Kitchen Moist mucosa without lesions. Neck supple and nontender. No palpable supraclavicular or cervical adenopathy. Normal sized without goiter. Respiratory WNL. No retractions.. Cardiovascular Pedal Pulses WNL. No clubbing, cyanosis or edema. Lymphatic No adneopathy. No adenopathy. No adenopathy. Musculoskeletal Adexa without tenderness or enlargement.. Digits and nails w/o clubbing, cyanosis, infection, petechiae, ischemia, or inflammatory conditions.Marland Kitchen Psychiatric Judgement and insight Intact.. No evidence of depression, anxiety, or agitation.. General Notes: the wound is looking excellent, with some epithelization at the edges of the wound and there is no debridement required today Integumentary (Hair, Skin) No suspicious lesions. No crepitus or fluctuance. No peri-wound warmth or erythema. No masses.. Wound #3 status is Open. Original cause of wound was Trauma. The wound is located on the Left Lower Leg. The wound measures 2cm length x 0.2cm width x 0.1cm depth; 0.314cm^2 area and 0.031cm^3 volume. The wound is limited to skin breakdown. There is no tunneling or undermining noted. There is a small amount of serosanguineous drainage noted. The wound margin is flat and intact. There is large (67- Chaput, Camilla R. ([id number]) 100%) pink granulation within the wound bed.  There is no necrotic tissue within the wound bed. The periwound skin appearance exhibited: Localized Edema, Ecchymosis. Periwound temperature was noted as No Abnormality. The periwound has tenderness on palpation. 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 - keep dressing dry Primary Wound Dressing: Wound #3 Left Lower Leg: Other: - Cutimed siltec foam Secondary Dressing: Dry Gauze Dressing Change Frequency: Wound #3 Left Lower Leg: Change dressing every week - only change if dressing gets wet Follow-up Appointments: Wound #3 Left Lower Leg: Return Appointment in 1 week. Edema Control: Wound #3 Left Lower Leg: Other: - kerlix and coban Home Health: Wound #3 Left Lower Leg: Continue Home Health Visits - see physicians order Home Health Nurse may visit PRN to address patient s wound care needs. MIKAILA, DIOGO ([id number]) I have recommended Cutimed siltec foam to  be applied over the wound with a secondary dressing and a light Kerlix and [place] to keep in place and changed every second day only if the need arises. If dry it should be left alone for the week Come back and see me next week an anticipated discharge soon. Electronic Signature(s) Signed: 11/23/2015 10:54:44 AM By: Roberta Fudge MD, FACS Entered By: Roberta Hampton on 11/23/2015 10:54:43 CORTISHA, TRAGER ([id number]) -------------------------------------------------------------------------------- SuperBill Details Patient Name: LOTTI, ANDLER. Date of Service: 11/23/2015 Medical Record Number: [id number] Patient Account Number:  [id number] Date of Birth/Sex: 1966/05/23 (50 y.o. 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 R23.3 Spontaneous ecchymoses Facility Procedures CPT4 Code: [id number] Description: 99213 - WOUND CARE VISIT-LEV 3 EST PT Modifier: Quantity: 1 Physician Procedures CPT4: Description Modifier Quantity Code [id number] 99213 - WC PHYS LEVEL 3 - EST PT 1 ICD-10 Description Diagnosis T81.31XA Disruption of external operation (surgical) wound, not elsewhere classified, initial encounter F81.9 Developmental disorder of  scholastic skills, unspecified L97.322 Non-pressure chronic ulcer of left ankle with fat layer exposed Electronic Signature(s) Signed: 11/23/2015 10:55:00 AM By: Roberta Fudge MD, FACS Entered By: Roberta Hampton on 11/23/2015 10:55:00

## 2015-11-30 ENCOUNTER — Encounter: Payer: Medicare Other | Admitting: Surgery

## 2015-11-30 DIAGNOSIS — L97322 Non-pressure chronic ulcer of left ankle with fat layer exposed: Secondary | ICD-10-CM | POA: Diagnosis not present

## 2015-11-30 NOTE — Progress Notes (Addendum)
Roberta Hampton, Roberta Hampton ([id number]) Visit Report for 11/30/2015 Chief Complaint Document Details Patient Name: Roberta Hampton, Roberta Hampton 11/30/2015 8:45 Date of Service: AM Medical Record [id number] Number: Patient Account Number: [id number] 28-Jun-1966 (50 y.o. Treating RN: Ahmed Prima Date of Birth/Sex: Female) Other Clinician: [facility], Treating Primary Care Physician: Ricky Stabs Physician/Extender: Norville Haggard, Referring Physician: Rivka Safer in Treatment: 10 Information Obtained from: Caregiver Chief Complaint Patient presents to the wound care center with open non-healing surgical wound which she's had for about 2 months Electronic Signature(s) Signed: 11/30/2015 9:14:11 AM By: Christin Fudge MD, FACS Entered By: Christin Fudge on 11/30/2015 09:14:11 Roberta Hampton, Roberta Hampton ([id number]) -------------------------------------------------------------------------------- HPI Details Patient Name: ZIONA, CICERO 11/30/2015 8:45 Date of Service: AM Medical Record [id number] Number: Patient Account Number: [id number] 1965-10-18 (50 y.o. Treating RN: Ahmed Prima Date of Birth/Sex: Female) Other Clinician: [facility], Treating Primary Care Physician: Ricky Stabs Physician/Extender: Norville Haggard, Referring Physician: Rivka Safer in Treatment: 10 History of Present Illness Location: left ankle area Quality: Patient reports experiencing a dull pain to affected area(s). Severity: Patient states wound are getting worse. Duration: Patient has had the wound for > 3 months prior to seeking treatment at the wound center Timing: Pain in wound is Intermittent (comes and goes Context: The wound occurred when the patient had a ORIF of the left ankle in September 2016 Modifying Factors: Other treatment(s) tried include: local care as prescribed by the orthopedic surgery group Associated Signs and Symptoms: Patient reports having increase swelling. HPI  Description: 50 year old patient who has development delays and has subnormal mental acuity, comes along with her caregivers with a nonhealing wound to the left lateral ankle which has been there for about 2-3 months. She had a ORIF of the left ankle in September 2016 and during follow-up visits was noted to have drainage and cellulitis from the wound which has been nonhealing. X-rays done in the orthopedic department have been normal as per the patient's caregivers but no reports are available. Past medical history significant for development delays, major depressive disorder, seizure disorder, status post hysterectomy, status post open reduction and internal fixation of bimalleolar left ankle fracture dislocation in September 2016. 10/12/2015 -- they have not received the RTD but have Hydrofera Blue sent to them. 11/09/2015 -- 3 days ago she injured her foot against a chair and has had ecchymosis hematoma and a fresh wound which was a little superior to the one which was almost healed. The patient was really close to discharge but now has had another wound. He has been on doxycycline. Electronic Signature(s) Signed: 11/30/2015 9:14:22 AM By: Christin Fudge MD, FACS Entered By: Christin Fudge on 11/30/2015 09:14:22 Roberta Hampton, Roberta Hampton ([id number]) -------------------------------------------------------------------------------- Physical Exam Details Patient Name: Roberta Hampton, Roberta Hampton 11/30/2015 8:45 Date of Service: AM Medical Record [id number] Number: Patient Account Number: [id number] 08/01/1966 (50 y.o. Treating RN: Ahmed Prima Date of Birth/Sex: Female) Other Clinician: [facility], Treating Primary Care Physician: Ricky Stabs Physician/Extender: Norville Haggard, Referring Physician: Rivka Safer in Treatment: 10 Constitutional . Pulse regular. Respirations normal and unlabored. Afebrile. . Eyes Nonicteric. Reactive to light. Ears, Nose, Mouth, and Throat Lips,  teeth, and gums WNL.Marland Kitchen Moist mucosa without lesions. Neck supple and nontender. No palpable supraclavicular or cervical adenopathy. Normal sized without goiter. Respiratory WNL. No retractions.. Cardiovascular Pedal Pulses WNL. No clubbing, cyanosis or edema. Lymphatic No adneopathy. No adenopathy. No adenopathy. Musculoskeletal Adexa without tenderness or enlargement.. Digits and nails w/o clubbing, cyanosis, infection, petechiae, ischemia, or inflammatory conditions.. Integumentary (Hair, Skin) No suspicious lesions. No crepitus or  fluctuance. No peri-wound warmth or erythema. No masses.Marland Kitchen Psychiatric Judgement and insight Intact.. No evidence of depression, anxiety, or agitation.. Notes the wound looks excellent and is completely healed and there is no surrounding cellulitis. Electronic Signature(s) Signed: 11/30/2015 9:14:47 AM By: Christin Fudge MD, FACS Entered By: Christin Fudge on 11/30/2015 09:14:47 Roberta Hampton, Roberta Hampton ([id number]) -------------------------------------------------------------------------------- Physician Orders Details Patient Name: Roberta Hampton, Roberta Hampton 11/30/2015 8:45 Date of Service: AM Medical Record [id number] Number: Patient Account Number: [id number] Jul 03, 1966 (50 y.o. Treating RN: Ahmed Prima Date of Birth/Sex: Female) Other Clinician: [facility], Treating Primary Care Physician: Ricky Stabs Physician/Extender: Norville Haggard, Referring Physician: Rivka Safer in Treatment: 10 Verbal / Phone Orders: Yes Clinician: Carolyne Fiscal, Debi Read Back and Verified: Yes Diagnosis Coding Wound Cleansing o Other: - keep dry and after one week can take a shower like normal Skin Barriers/Peri-Wound Care o Moisturizing lotion - after one week Secondary Dressing o Gauze and Kerlix/Conform o Foam Dressing Change Frequency o Change dressing every week - take off in one week and protect after that and keep [place] healed may discontinue services Discharge From [facility] Services o Discharge from [place] - take off dressing in one week and protect after that and keep moisturized please call if you need anything Electronic Signature(s) Signed: 11/30/2015 4:52:55 PM By: Christin Fudge MD, FACS Signed: 11/30/2015 5:48:30 PM By: Alric Quan Entered By: Alric Quan on 11/30/2015 09:17:43 BRITTNEY, CASTILLEJO ([id number]) -------------------------------------------------------------------------------- Problem List Details Patient Name: Roberta Hampton, Roberta Hampton 11/30/2015 8:45 Date of Service: AM Medical Record [id number] Number: Patient Account Number: [id number] October 09, 1965 (50 y.o. 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 Treating RN: Ahmed Prima Date of Birth/Sex: Female) Other Clinician: [facility], Treating Primary Care  Physician: Ricky Stabs Physician/Extender: Norville Haggard, Referring Physician: Rivka Safer in Treatment: 10 Subjective Chief Complaint Information obtained from Caregiver Patient presents to the wound care center with open non-healing surgical wound which she's had for about 2 months History of Present Illness (HPI) The following HPI elements were documented for the patient's wound: Location: left ankle area Quality: Patient reports experiencing a dull pain to affected area(s). Severity: Patient states wound are getting worse. Duration: Patient has had the wound for > 3 months prior to seeking treatment at the wound center Timing: Pain in wound is Intermittent (comes and goes Context: The wound occurred when the patient had a ORIF of the left ankle in September 2016 Modifying Factors: Other treatment(s) tried include: local care as prescribed by the orthopedic surgery group Associated Signs and Symptoms: Patient reports having increase swelling. 50 year old patient who has development delays and has subnormal mental acuity, comes along with her caregivers with a nonhealing wound to the left lateral ankle which has been there for about 2-3 months.  She had a ORIF of the left ankle in September 2016 and during follow-up visits was noted to have drainage and cellulitis from the wound which has been nonhealing. X-rays done in the orthopedic department have been normal as per the patient's caregivers but no reports are available. Past medical history significant for development delays, major depressive disorder, seizure disorder, status post hysterectomy, status post open reduction and internal fixation of bimalleolar left ankle fracture dislocation in September 2016. 10/12/2015 -- they have not received the RTD but have Hydrofera Blue sent to them. 11/09/2015 -- 3 days ago she injured her foot against a chair and has had ecchymosis hematoma and a fresh wound which was a little  superior to the one which was almost healed. The patient was really close to discharge but now has had another wound. He has been on doxycycline. Roberta Hampton, Roberta Hampton ([id number]) Objective Constitutional Pulse regular. Respirations normal and unlabored. Afebrile. Vitals Time Taken: 9:01 AM, Pulse: 89 bpm, Respiratory Rate: 20 breaths/min, Blood Pressure: 95/69 mmHg. Eyes Nonicteric. Reactive to light. Ears, Nose, Mouth, and Throat Lips, teeth, and gums WNL.Marland Kitchen Moist mucosa without lesions. Neck supple and nontender. No palpable supraclavicular or cervical adenopathy. Normal sized without goiter. Respiratory WNL. No retractions.. Cardiovascular Pedal Pulses WNL. No clubbing, cyanosis or edema. Lymphatic No adneopathy. No adenopathy. No adenopathy. Musculoskeletal Adexa without tenderness or enlargement.. Digits and nails w/o clubbing, cyanosis, infection, petechiae, ischemia, or inflammatory conditions.Marland Kitchen Psychiatric Judgement and insight Intact.. No evidence of depression, anxiety, or agitation.. General Notes: the wound looks excellent and is completely healed and there is no surrounding cellulitis. Integumentary (Hair, Skin) No suspicious lesions. No crepitus or fluctuance. No peri-wound warmth or erythema. No masses.. Wound #3 status is Healed - Epithelialized. Original cause of wound was Trauma. The wound is located on the Left Lower Leg. The wound measures 0cm length x 0cm width x 0cm depth; 0cm^2 area and 0cm^3 volume. The wound is limited to skin breakdown. There is no tunneling or undermining noted. There is a small amount of serosanguineous drainage noted. The wound margin is flat and intact. There is no Wolfert, Malli R. ([id number]) granulation within the wound bed. There is no necrotic tissue within the wound bed. The periwound skin appearance exhibited: Ecchymosis. Periwound temperature was noted as No Abnormality. The periwound has tenderness on  palpation. 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 She is discharged on the wound care services and will be seen back as needed. Plan Wound Cleansing: Other: - keep dry and after one week can take a shower like normal Skin Barriers/Peri-Wound Care: Moisturizing lotion - after one week Secondary Dressing: Gauze and Kerlix/Conform Foam Dressing Change Frequency: Change dressing every week - take off in one week and protect after that and keep moisturized Home Health: D/C [place] healed may discontinue services Discharge From [facility] Services: Discharge from [place] - take off dressing in one week and protect after that and keep moisturized please call if you need anything REEGAN, COFFEE R. ([id number]) Having completely healed the wound, we will apply a another piece of foam over this just to protect the young  scar and a prior Kerlix and Coban. She is discharged on the wound care services and will be seen back as needed. Electronic Signature(s) Signed: 11/30/2015 4:58:48 PM By: Christin Fudge MD, FACS Previous Signature: 11/30/2015 9:15:44 AM Version By: Christin Fudge MD, FACS Entered By: Christin Fudge on 11/30/2015 16:58:48 TYNA, HASPER ([id number]) -------------------------------------------------------------------------------- SuperBill Details Patient Name: JOELLY, BUTCHER. Date of Service: 11/30/2015 Medical Record Number: [id number] Patient Account Number: [id number] Date of Birth/Sex: 02/19/1966 (50 y.o. 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 R23.3 Spontaneous ecchymoses Facility Procedures CPT4 Code: [id number] Description: 99213 - WOUND CARE VISIT-LEV 3 EST PT Modifier: Quantity: 1 Physician Procedures CPT4: Description Modifier Quantity Code [id number] - WC PHYS LEVEL 2 - EST PT 1 ICD-10 Description Diagnosis T81.31XA Disruption of external operation (surgical) wound, not elsewhere classified, initial encounter F81.9 Developmental disorder of  scholastic skills, unspecified L97.322 Non-pressure chronic ulcer of left ankle with fat layer exposed R23.3 Spontaneous ecchymoses Electronic Signature(s) Signed: 11/30/2015 4:52:55 PM By: Christin Fudge MD, FACS Signed: 11/30/2015 5:48:30 PM By: Alric Quan Previous Signature: 11/30/2015 9:15:59 AM Version By: Christin Fudge MD, FACS Entered By: Alric Quan on 11/30/2015 11:43:20

## 2015-12-01 NOTE — Progress Notes (Signed)
KAMEKO, KNOWLDEN ([id number]) Visit Report for 11/30/2015 Arrival Information Details Patient Name: Roberta Hampton, Roberta Hampton. Date of Service: 11/30/2015 8:45 AM Medical Record Number: [id number] Patient Account Number: [id number] Date of Birth/Sex: [birth year] (50 y.o. Female) Treating RN: Carolyne Fiscal, Debi Primary Care Physician: [name] Other Clinician: Referring Physician: [name] Treating Physician/Extender: Frann Rider in Treatment: 10 Visit Information History Since Last Visit All ordered tests and consults were completed: No Patient Arrived: Ambulatory Added or deleted any medications: No Arrival Time: 09:01 Any new allergies or adverse reactions: No Accompanied By: caregiver Had a fall or experienced change in No Transfer Assistance: None activities of daily living that may affect Patient Requires Transmission-Based No risk of falls: Precautions: Signs or symptoms of abuse/neglect since last No Patient Has Alerts: No visito Hospitalized since last visit: No Pain Present Now: No Electronic Signature(s) Signed: 11/30/2015 5:48:30 PM By: Alric Quan Entered By: Alric Quan on 11/30/2015 09:01:37 [place], Roberta Hampton ([id number]) -------------------------------------------------------------------------------- Clinic Level of Care Assessment Details Patient Name: Roberta Hampton. Date of Service: 11/30/2015 8:45 AM Medical Record Number: [id number] Patient Account Number: [id number] Date of Birth/Sex: [birth year] (50 y.o. Female) Treating RN: Carolyne Fiscal, Debi Primary Care Physician: [name] Other Clinician: Referring Physician: [name] Treating Physician/Extender: Frann Rider in Treatment: 10 Clinic Level of Care Assessment Items TOOL 4 Quantity Score []  - Use when only an EandM is performed on FOLLOW-UP visit 0 ASSESSMENTS - Nursing Assessment / Reassessment []  - Reassessment of Co-morbidities  (includes updates in patient status) 0 X - Reassessment of Adherence to Treatment Plan 1 5 ASSESSMENTS - Wound and Skin Assessment / Reassessment X - Simple Wound Assessment / Reassessment - one wound 1 5 []  - Complex Wound Assessment / Reassessment - multiple wounds 0 []  - Dermatologic / Skin Assessment (not related to wound area) 0 ASSESSMENTS - Focused Assessment []  - Circumferential Edema Measurements - multi extremities 0 []  - Nutritional Assessment / Counseling / Intervention 0 []  - Lower Extremity Assessment (monofilament, tuning fork, pulses) 0 []  - Peripheral Arterial Disease Assessment (using hand held doppler) 0 ASSESSMENTS - Ostomy and/or Continence Assessment and Care []  - Incontinence Assessment and Management 0 []  - Ostomy Care Assessment and Management (repouching, etc.) 0 PROCESS - Coordination of Care []  - Simple Patient / Family Education for ongoing care 0 X - Complex (extensive) Patient / Family Education for ongoing care 1 20 []  - Staff obtains Programmer, systems, Records, Test Results / Process Orders 0 X - Staff telephones HHA, Nursing Homes / Clarify orders / etc 1 10 []  - Routine Transfer to another Facility (non-emergent condition) 0 Roberta Hampton, Roberta R. ([id number]) []  - Routine Hospital Admission (non-emergent condition) 0 []  - New Admissions / Biomedical engineer / Ordering NPWT, Apligraf, etc. 0 []  - Emergency Hospital Admission (emergent condition) 0 []  - Simple Discharge Coordination 0 X - Complex (extensive) Discharge Coordination 1 15 PROCESS - Special Needs []  - Pediatric / Minor Patient Management 0 []  - Isolation Patient Management 0 []  - Hearing / Language / Visual special needs 0 []  - Assessment of Community assistance (transportation, D/C planning, etc.) 0 []  - Additional assistance / Altered mentation 0 []  - Support Surface(s) Assessment (bed, cushion, seat, etc.) 0 INTERVENTIONS - Wound Cleansing / Measurement X - Simple Wound Cleansing - one wound  1 5 []  - Complex Wound Cleansing - multiple wounds 0 X - Wound Imaging (photographs - any number of wounds) 1 5 []  - Wound Tracing (instead of photographs) 0 X - Simple  Wound Measurement - one wound 1 5 []  - Complex Wound Measurement - multiple wounds 0 INTERVENTIONS - Wound Dressings X - Small Wound Dressing one or multiple wounds 1 10 []  - Medium Wound Dressing one or multiple wounds 0 []  - Large Wound Dressing one or multiple wounds 0 []  - Application of Medications - topical 0 []  - Application of Medications - injection 0 INTERVENTIONS - Miscellaneous []  - External ear exam 0 Roberta Hampton, Roberta R. ([id number]) []  - Specimen Collection (cultures, biopsies, blood, body fluids, etc.) 0 []  - Specimen(s) / Culture(s) sent or taken to Lab for analysis 0 []  - Patient Transfer (multiple staff / Harrel Lemon Lift / Similar devices) 0 []  - Simple Staple / Suture removal (25 or less) 0 []  - Complex Staple / Suture removal (26 or more) 0 []  - Hypo / Hyperglycemic Management (close monitor of Blood Glucose) 0 []  - Ankle / Brachial Index (ABI) - do not check if billed separately 0 X - Vital Signs 1 5 Has the patient been seen at the hospital within the last three years: Yes Total Score: 85 Level Of Care: New/Established - Level 3 Electronic Signature(s) Signed: 11/30/2015 5:48:30 PM By: Alric Quan Entered By: Alric Quan on 11/30/2015 11:43:09 [place], Roberta Hampton ([id number]) -------------------------------------------------------------------------------- Encounter Discharge Information Details Patient Name: Roberta Hampton. Date of Service: 11/30/2015 8:45 AM Medical Record Number: [id number] Patient Account Number: [id number] Date of Birth/Sex: 18-Jul-1966 (50 y.o. Female) Treating RN: Carolyne Fiscal, Debi Primary Care Physician: [name] Other Clinician: Referring Physician: [name] Treating Physician/Extender: Frann Rider in Treatment: 10 Encounter  Discharge Information Items Discharge Pain Level: 0 Discharge Condition: Stable Ambulatory Status: Ambulatory Discharge Destination: Nursing Home Transportation: Other Accompanied By: caregiver Schedule Follow-up Appointment: Yes Medication Reconciliation completed and provided to Patient/Care Yes Delena Casebeer: Provided on Clinical Summary of Care: 11/30/2015 Form Type Recipient Paper Patient cc Electronic Signature(s) Signed: 11/30/2015 9:29:34 AM By: Ruthine Dose Entered By: Ruthine Dose on 11/30/2015 09:29:34 [place], Roberta Hampton ([id number]) -------------------------------------------------------------------------------- Lower Extremity Assessment Details Patient Name: Roberta Hampton. Date of Service: 11/30/2015 8:45 AM Medical Record Number: [id number] Patient Account Number: [id number] Date of Birth/Sex: 1966-08-03 (50 y.o. Female) Treating RN: Carolyne Fiscal, Debi Primary Care Physician: [name] Other Clinician: Referring Physician: [name] Treating Physician/Extender: Frann Rider in Treatment: 10 Vascular Assessment Pulses: Posterior Tibial Dorsalis Pedis Palpable: [Left:Yes] Extremity colors, hair growth, and conditions: Extremity Color: [Left:Hyperpigmented] Temperature of Extremity: [Left:Warm] Capillary Refill: [Left:< 3 seconds] Toe Nail Assessment Left: Right: Thick: No Discolored: No Deformed: No Improper Length and Hygiene: No Electronic Signature(s) Signed: 11/30/2015 5:48:30 PM By: Alric Quan Entered By: Alric Quan on 11/30/2015 09:08:20 [place], Roberta Hampton ([id number]) -------------------------------------------------------------------------------- Multi Wound Chart Details Patient Name: Roberta Hampton. Date of Service: 11/30/2015 8:45 AM Medical Record Number: [id number] Patient Account Number: [id number] Date of Birth/Sex: 1966-07-03 (50 y.o. Female) Treating RN: Carolyne Fiscal, Debi Primary Care Physician:  [name] Other Clinician: Referring Physician: [name] Treating Physician/Extender: Frann Rider in Treatment: 10 Vital Signs Height(in): Pulse(bpm): 89 Weight(lbs): Blood Pressure 95/69 (mmHg): Body Mass Index(BMI): Temperature(F): Respiratory Rate 20 (breaths/min): Photos: [3:No Photos] [N/A:N/A] Wound Location: [3:Left Lower Leg] [N/A:N/A] Wounding Event: [3:Trauma] [N/A:N/A] Primary Etiology: [3:Trauma, Other] [N/A:N/A] Comorbid History: [3:Seizure Disorder] [N/A:N/A] Date Acquired: [3:11/06/2015] [N/A:N/A] Weeks of Treatment: [3:3] [N/A:N/A] Wound Status: [3:Healed - Epithelialized] [N/A:N/A] Measurements L x W x D 0x0x0 [N/A:N/A] (cm) Area (cm) : [3:0] [N/A:N/A] Volume (cm) : [3:0] [N/A:N/A] % Reduction in Area: [3:100.00%] [N/A:N/A] % Reduction in Volume: 100.00% [N/A:N/A]  Classification: [3:Partial Thickness] [N/A:N/A] Exudate Amount: [3:Small] [N/A:N/A] Exudate Type: [3:Serosanguineous] [N/A:N/A] Exudate Color: [3:red, brown] [N/A:N/A] Wound Margin: [3:Flat and Intact] [N/A:N/A] Granulation Amount: [3:None Present (0%)] [N/A:N/A] Necrotic Amount: [3:None Present (0%)] [N/A:N/A] Exposed Structures: [3:Fascia: No Fat: No Tendon: No Muscle: No Joint: No Bone: No Limited to Skin Breakdown] [N/A:N/A] Epithelialization: [3:Large (67-100%)] [N/A:N/A] Periwound Skin Texture: No Abnormalities Noted N/A N/A Periwound Skin No Abnormalities Noted N/A N/A Moisture: Periwound Skin Color: Ecchymosis: Yes N/A N/A Temperature: No Abnormality N/A N/A Tenderness on Yes N/A N/A Palpation: Wound Preparation: Ulcer Cleansing: N/A N/A Rinsed/Irrigated with Saline Topical Anesthetic Applied: None Treatment Notes Electronic Signature(s) Signed: 11/30/2015 5:48:30 PM By: Alric Quan Entered By: Alric Quan on 11/30/2015 09:12:11 Roberta Hampton  ([id number]) -------------------------------------------------------------------------------- [place] Details Patient Name: Roberta Hampton, MCKERCHER. Date of Service: 11/30/2015 8:45 AM Medical Record Number: [id number] Patient Account Number: [id number] Date of Birth/Sex: 1965-12-19 (50 y.o. Female) Treating RN: Carolyne Fiscal, Debi Primary Care Physician: [name] Other Clinician: Referring Physician: [name] Treating Physician/Extender: Frann Rider in Treatment: 10 Active Inactive Electronic Signature(s) Signed: 11/30/2015 5:48:30 PM By: Alric Quan Entered By: Alric Quan on 11/30/2015 11:44:34 [place], [place]. ([id number]) -------------------------------------------------------------------------------- Pain Assessment Details Patient Name: Roberta Hampton, Roberta Hampton. Date of Service: 11/30/2015 8:45 AM Medical Record Number: [id number] Patient Account Number: [id number] Date of Birth/Sex: 19-Mar-1966 (50 y.o. Female) Treating RN: Carolyne Fiscal, Debi Primary Care Physician: [name] Other Clinician: Referring Physician: [name] Treating Physician/Extender: Frann Rider in Treatment: 10 Active Problems Location of Pain Severity and Description of Pain Patient Has Paino No Site Locations Pain Management and Medication Current Pain Management: Electronic Signature(s) Signed: 11/30/2015 5:48:30 PM By: Alric Quan Entered By: Alric Quan on 11/30/2015 09:01:43 Roberta Hampton ([id number]) -------------------------------------------------------------------------------- Patient/Caregiver Education Details Patient Name: Roberta Hampton, Roberta Hampton. Date of Service: 11/30/2015 8:45 AM Medical Record Number: [id number] Patient Account Number: [id number] Date of Birth/Gender: Nov 25, 1965 (50 y.o. Female) Treating RN: Carolyne Fiscal, Debi Primary Care Physician: [name] Other Clinician: Referring  Physician: [name] Treating Physician/Extender: Frann Rider in Treatment: 10 Education Assessment Education Provided To: Patient and Caregiver Education Topics Provided Wound/Skin Impairment: Handouts: Other: take off dressing in one week and protect after that and keep moisturized Methods: Demonstration, Explain/Verbal Responses: State content correctly Electronic Signature(s) Signed: 11/30/2015 5:48:30 PM By: Alric Quan Entered By: Alric Quan on 11/30/2015 09:27:48 Roberta Hampton, Roberta Hampton ([id number]) -------------------------------------------------------------------------------- Wound Assessment Details Patient Name: Roberta Hampton. Date of Service: 11/30/2015 8:45 AM Medical Record Number: [id number] Patient Account Number: [id number] Date of Birth/Sex: Aug 15, 1966 (50 y.o. Female) Treating RN: Carolyne Fiscal, Debi Primary Care Physician: [name] Other Clinician: Referring Physician: [name] Treating Physician/Extender: Frann Rider in Treatment: 10 Wound Status Wound Number: 3 Primary Etiology: Trauma, Other Wound Location: Left Lower Leg Wound Status: Healed - Epithelialized Wounding Event: Trauma Comorbid History: Seizure Disorder Date Acquired: 11/06/2015 Weeks Of Treatment: 3 Clustered Wound: No Photos Photo Uploaded By: Alric Quan on 11/30/2015 17:17:34 Wound Measurements Length: (cm) 0 % Reduction in Width: (cm) 0 % Reduction in Depth: (cm) 0 Epithelializati Area: (cm) 0 Tunneling: Volume: (cm) 0 Undermining: Area: 100% Volume: 100% on: Large (67-100%) No No Wound Description Classification: Partial Thickness Wound Margin: Flat and Intact Exudate Amount: Small Exudate Type: Serosanguineous Exudate Color: red, brown Foul Odor After Cleansing: No Wound Bed Granulation Amount: None Present (0%) Exposed Structure Necrotic Amount: None Present (0%) Fascia Exposed: No Fat Layer  Exposed: No Tendon Exposed: No Roberta Hampton, Roberta R. ([id number]) Muscle Exposed: No Joint  Exposed: No Bone Exposed: No Limited to Skin Breakdown Periwound Skin Texture Texture Color No Abnormalities Noted: No No Abnormalities Noted: No Ecchymosis: Yes Moisture No Abnormalities Noted: No Temperature / Pain Temperature: No Abnormality Tenderness on Palpation: Yes Wound Preparation Ulcer Cleansing: Rinsed/Irrigated with Saline Topical Anesthetic Applied: None Electronic Signature(s) Signed: 11/30/2015 5:48:30 PM By: Alric Quan Entered By: Alric Quan on 11/30/2015 09:11:56 Roberta Hampton, Roberta Hampton ([id number]) -------------------------------------------------------------------------------- [place] Details Patient Name: Roberta Hampton. Date of Service: 11/30/2015 8:45 AM Medical Record Number: [id number] Patient Account Number: [id number] Date of Birth/Sex: [birth year] (50 y.o. Female) Treating RN: Carolyne Fiscal, Debi Primary Care Physician: [name] Other Clinician: Referring Physician: [name] Treating Physician/Extender: Frann Rider in Treatment: 10 Vital Signs Time Taken: 09:01 Pulse (bpm): 89 Respiratory Rate (breaths/min): 20 Blood Pressure (mmHg): 95/69 Reference Range: 80 - 120 mg / dl Electronic Signature(s) Signed: 11/30/2015 5:48:30 PM By: Alric Quan Entered By: Alric Quan on 11/30/2015 09:03:49

## 2015-12-17 DIAGNOSIS — Z0000 Encounter for general adult medical examination without abnormal findings: Secondary | ICD-10-CM | POA: Insufficient documentation

## 2015-12-25 ENCOUNTER — Ambulatory Visit: Payer: Medicare Other | Admitting: Sports Medicine

## 2016-01-06 ENCOUNTER — Ambulatory Visit (INDEPENDENT_AMBULATORY_CARE_PROVIDER_SITE_OTHER): Payer: Medicare Other | Admitting: Podiatry

## 2016-01-06 ENCOUNTER — Encounter: Payer: Self-pay | Admitting: Podiatry

## 2016-01-06 VITALS — BP 98/74 | HR 97 | Resp 16

## 2016-01-06 DIAGNOSIS — M79676 Pain in unspecified toe(s): Secondary | ICD-10-CM | POA: Diagnosis not present

## 2016-01-06 DIAGNOSIS — B351 Tinea unguium: Secondary | ICD-10-CM | POA: Diagnosis not present

## 2016-01-06 NOTE — Progress Notes (Signed)
   Subjective:    Patient ID: Roberta Hampton, female    DOB: 09/30/1965, 50 y.o.   MRN: [id number]  HPI: She presents today with her assistant/aid. She presents for routine nail debridement. They're changing providers from [place] to [place]. [name] is mentally disabled with painful toenails.    Review of Systems  Neurological: Positive for seizures.  Hematological: Bruises/bleeds easily.  Psychiatric/Behavioral: Positive for behavioral problems.  All other systems reviewed and are negative.      Objective:   Physical Exam: Vital signs are stable she is alert and oriented. Pulses are strongly palpable neurologic sensorium is intact. Orthopedics demonstrates a history of a fractured ankle left with a wound that has gone on to heal uneventfully at this point. She has pain on palpation to the toenails which are thick yellow dystrophic onychomycotic.        Assessment & Plan:  Assessment: Pain and limp secondary to onychomycosis.  Plan: Debridement of toenails 1 through 5 bilateral.

## 2016-02-02 ENCOUNTER — Other Ambulatory Visit: Payer: Self-pay | Admitting: *Deleted

## 2016-02-02 ENCOUNTER — Encounter: Payer: Self-pay | Admitting: Nurse Practitioner

## 2016-02-02 ENCOUNTER — Ambulatory Visit (INDEPENDENT_AMBULATORY_CARE_PROVIDER_SITE_OTHER): Payer: Medicare Other | Admitting: Nurse Practitioner

## 2016-02-02 VITALS — BP 87/70 | HR 78 | Ht <= 58 in | Wt 141.0 lb

## 2016-02-02 DIAGNOSIS — Z5181 Encounter for therapeutic drug level monitoring: Secondary | ICD-10-CM

## 2016-02-02 DIAGNOSIS — F79 Unspecified intellectual disabilities: Secondary | ICD-10-CM

## 2016-02-02 DIAGNOSIS — G40319 Generalized idiopathic epilepsy and epileptic syndromes, intractable, without status epilepticus: Secondary | ICD-10-CM

## 2016-02-02 MED ORDER — VALPROATE SODIUM 250 MG/5ML PO SOLN: ORAL | Status: DC

## 2016-02-02 NOTE — Patient Instructions (Signed)
Per group home sheet 

## 2016-02-02 NOTE — Progress Notes (Signed)
I have read the note, and I agree with the clinical assessment and plan.  Iliyana Convey KEITH   

## 2016-02-02 NOTE — Progress Notes (Signed)
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 She was last seen in the office 02/26/15.  She has done well for a number of years without recurring seizures. Last  seizures  occurred on February 19 and on October 25, 2014. The patient has staring episodes associated with her seizures with some slumping.  The patient had Depakene level drawn 12/29/2015 with a level of 109. Her Depakene dose is 10 MLS in the morning and 7.5 ML's at night (250mg /47ml) she has a history of mild chronic thrombocytopenia. She continues to see psychiatry for her behavior problems and is currently on Risperdal and Seroquel. Appetite is good and she is sleeping well. She goes to a day program. She returns for reevaluation REVIEW OF SYSTEMS: Full 14 system review of systems performed and notable only for those listed, all others are neg:  Constitutional: neg  Cardiovascular: neg Ear/Nose/Throat: neg  Skin: neg Eyes: neg Respiratory: neg Gastroitestinal: neg  Hematology/Lymphatic: neg  Endocrine: neg Musculoskeletal:neg Allergy/Immunology: neg Neurological: History of seizure disorder and MR Psychiatric: Hyperactivity, agitation at times  Sleep : neg   ALLERGIES: No Known Allergies  HOME MEDICATIONS: Outpatient Prescriptions Prior to Visit  Medication Sig Dispense Refill  . bisacodyl (DULCOLAX) 10 MG suppository Place 1 suppository (10 mg total) rectally daily as needed for moderate constipation. 12 suppository 0  . Calcium Carb-Cholecalciferol (CALCIUM 600 + D PO) Take 600 mg by mouth 2 (two) times daily.    Marland Kitchen docusate (COLACE) 50 MG/5ML liquid Take 50 mg by mouth 2 (two) times daily. One teaspoon    . guaifenesin (COUGH SYRUP)  100 MG/5ML syrup Take 200 mg by mouth as needed.     . loratadine (CLARITIN) 10 MG tablet Take 10 mg by mouth daily.    . Multiple Vitamin (MULTIVITAMIN) tablet Take 1 tablet by mouth daily.    . ondansetron (ZOFRAN) 4 MG tablet Take 1 tablet (4 mg total) by mouth every 6 (six) hours as needed for nausea. 20 tablet 0  . oxyCODONE (OXY IR/ROXICODONE) 5 MG immediate release tablet Take 1 tablet (5 mg total) by mouth every 6 (six) hours as needed for breakthrough pain. 20 tablet 0  . polyethylene glycol (MIRALAX / GLYCOLAX) packet Take 17 g by mouth daily.    . QUEtiapine (SEROQUEL) 400 MG tablet Take 800 mg by mouth at bedtime.     . risperiDONE (RISPERDAL) 2 MG tablet Take 2 mg by mouth 2 (two) times daily.    Marland Kitchen valproate (DEPAKENE) 250 MG/5ML syrup Take 10 mLs (500 mg total) by mouth 2 (two) times daily. 1 teaspoon (Patient taking differently: Take 500 mg by mouth 2 (two) times daily. Takes 75mls in AM and 7.5 mls in PM) 600 mL 5  . acetaminophen (TYLENOL) 325 MG tablet Take 2 tablets (650 mg total) by mouth every 6 (six) hours as needed for mild pain (or Fever >/= 101). 30 tablet 0  . Calcium Carbonate-Vitamin D (CALCIUM 600+D) 600-200 MG-UNIT TABS     . fluvoxaMINE (LUVOX) 25 MG tablet Take 25 mg by mouth at bedtime.    . Pediatric Multivitamins-Fl (MULTIVITAMIN/FLUORIDE) 0.25 MG CHEW     . vitamin E 1000 UNIT capsule Take 1,000 Units by mouth daily.    . vitamin  E 400 UNIT capsule Take 1 capsule (400 Units total) by mouth daily. (Patient not taking: Reported on 06/27/2015) 10 capsule 0   No facility-administered medications prior to visit.    PAST MEDICAL HISTORY: Past Medical History  Diagnosis Date  . Seizure disorder ([place])   . Hyponatremia     associated with polydipsia  . Obesity   . Mental retardation   . Attention deficit disorder   . Seizures ([place])     PAST SURGICAL HISTORY: Past Surgical History  Procedure Laterality Date  . Vaginal hysterectomy    . Orif ankle fracture  Left 05/08/2015    Procedure: OPEN REDUCTION INTERNAL FIXATION (ORIF) ANKLE FRACTURE;  Surgeon: Corky Mull, MD;  Location: [facility] ORS;  Service: Orthopedics;  Laterality: Left;    FAMILY HISTORY: Family History  Problem Relation Age of Onset  . Asthma Brother   . Seizures      paternal cousin    SOCIAL HISTORY: Social History   Social History  . Marital Status: Single    Spouse Name: N/A  . Number of Children: 0  . Years of Education: N/A   Occupational History  . Not on file.   Social History Main Topics  . Smoking status: Never Smoker   . Smokeless tobacco: Never Used  . Alcohol Use: No  . Drug Use: No  . Sexual Activity: Not on file   Other Topics Concern  . Not on file   Social History Narrative   Patient is single and lives in a group home.   Patient does not have any children.   Patient is right handed.   Patient drinks caffeine daily.     PHYSICAL EXAM  Filed Vitals:   02/02/16 1412  BP: 87/70  Pulse: 78  Height: 4\' 10"  (1.473 m)  Weight: 141 lb (63.957 kg)   Body mass index is 29.48 kg/(m^2). Generalized: Well developed, minimally obese female in no acute distress   Neurological examination   Mentation: Alert MR. Speech is slightly dysarthric  Cranial nerve II-XII: Visual fields were full on confrontational test. Extraocular movements are full Facial sensation and strength were normal. hearing was intact to finger rubbing bilaterally. Uvula tongue midline. head turning and shoulder shrug and were normal and symmetric. Motor: normal bulk and tone, full strength in the BUE, BLE, Sensory withdraws to pain Coordination: finger-nose-finger, heel-to-shin bilaterally, the patient does have some apraxia with the lower extremities  Reflexes: Brachioradialis 2/2, biceps 2/2, triceps 2/2, patellar 2/2, Achilles 2/2, plantar responses were flexor bilaterally. Gait and Station: Rising up from seated position with assistance, wide based stance, moderate  stride. No difficulty with turns, no assistive device  DIAGNOSTIC DATA (LABS, IMAGING, TESTING) - I reviewed patient records, labs, notes, testing and imaging myself where available.  Lab Results  Component Value Date   WBC 9.3 06/27/2015   HGB 15.5 06/27/2015   HCT 45.3 06/27/2015   MCV 95.1 06/27/2015   PLT 96* 06/27/2015      Component Value Date/Time   NA 136 06/27/2015 0147   NA 142 07/21/2014 1011   NA 143 11/13/2011 2004   K 4.0 06/27/2015 0147   K 3.8 11/13/2011 2004   CL 99* 06/27/2015 0147   CL 105 11/13/2011 2004   CO2 27 06/27/2015 0147   CO2 26 11/13/2011 2004   GLUCOSE 106* 06/27/2015 0147   GLUCOSE 64* 07/21/2014 1011   GLUCOSE 96 11/13/2011 2004   BUN 19 06/27/2015 0147   BUN 21 07/21/2014 1011  BUN 17 11/13/2011 2004   CREATININE 0.62 06/27/2015 0147   CREATININE 0.58* 11/13/2011 2004   CALCIUM 9.6 06/27/2015 0147   CALCIUM 9.0 11/13/2011 2004   PROT 6.5 06/27/2015 0147   PROT 6.0 07/21/2014 1011   PROT 6.4 11/13/2011 2004   ALBUMIN 3.8 06/27/2015 0147   ALBUMIN 4.4 07/21/2014 1011   ALBUMIN 3.5 11/13/2011 2004   AST 22 06/27/2015 0147   AST 18 11/13/2011 2004   ALT 11* 06/27/2015 0147   ALT 26 11/13/2011 2004   ALKPHOS 67 06/27/2015 0147   ALKPHOS 91 11/13/2011 2004   BILITOT 0.6 06/27/2015 0147   BILITOT 0.7 11/13/2011 2004   GFRNONAA >60 06/27/2015 0147   GFRNONAA >60 11/13/2011 2004   GFRAA >60 06/27/2015 0147   GFRAA >60 11/13/2011 2004    ASSESSMENT AND PLAN  50 y.o. year old female  has a past medical history of Seizure disorder ([place]);  Mental retardation; Attention deficit disorder; and Seizures ([place]). hereTo follow-up. Depakene level 12/29/2015  was 109.  Liver function within normal limits. Platelets 96 with history of chronic thrombocytopenia .   Continue Depakene 500 mg am and 375mg  in the pm. (250mg  /5 ml) Follow-up 1 year, next with Dr. Jannifer Franklin Call for any seizure activity Dennie Bible, [facility], [facility], APRN  [facility] [address] [place], [place] [address] [phone]

## 2016-02-03 ENCOUNTER — Telehealth: Payer: Self-pay | Admitting: *Deleted

## 2016-02-03 NOTE — Telephone Encounter (Signed)
I confirmed with pharmacare re: depakene syrup and solution (same).  Both oral meds (syrup thicker).  Fax confirmation received for prescription.

## 2016-02-25 ENCOUNTER — Ambulatory Visit: Payer: Self-pay | Admitting: Nurse Practitioner

## 2016-02-26 ENCOUNTER — Ambulatory Visit: Payer: Medicare Other | Admitting: Nurse Practitioner

## 2016-03-09 ENCOUNTER — Ambulatory Visit: Payer: Medicare Other | Admitting: Nurse Practitioner

## 2016-03-23 ENCOUNTER — Ambulatory Visit (INDEPENDENT_AMBULATORY_CARE_PROVIDER_SITE_OTHER): Payer: Medicare Other | Admitting: Podiatry

## 2016-03-23 ENCOUNTER — Encounter: Payer: Self-pay | Admitting: Podiatry

## 2016-03-23 DIAGNOSIS — B351 Tinea unguium: Secondary | ICD-10-CM

## 2016-03-23 DIAGNOSIS — M79676 Pain in unspecified toe(s): Secondary | ICD-10-CM

## 2016-03-23 NOTE — Progress Notes (Signed)
She presents today with her assistance chief complaint of painful elongated toenails.  Objective: Vital signs are stable she is alert. Pulses are palpable bilateral. Toenails are thick yellow dystrophic onychomycotic painful on palpation and debridement. No open lesions or wounds are noted.  Assessment: Pain in limb secondary to onychomycosis.  Plan: Debridement of toenails 1 through 5 bilateral. Follow up with me in 3-4 months.

## 2016-04-11 ENCOUNTER — Ambulatory Visit: Payer: Medicare Other | Admitting: Podiatry

## 2016-04-25 ENCOUNTER — Other Ambulatory Visit: Payer: Self-pay | Admitting: Family Medicine

## 2016-04-25 DIAGNOSIS — Z1231 Encounter for screening mammogram for malignant neoplasm of breast: Secondary | ICD-10-CM

## 2016-05-03 ENCOUNTER — Encounter: Payer: Self-pay | Admitting: Emergency Medicine

## 2016-05-03 ENCOUNTER — Emergency Department
Admission: EM | Admit: 2016-05-03 | Discharge: 2016-05-03 | Disposition: A | Discharge status: Home / Self Care | Payer: Medicare Other | Attending: Emergency Medicine | Admitting: Emergency Medicine

## 2016-05-03 ENCOUNTER — Emergency Department: Payer: Medicare Other

## 2016-05-03 DIAGNOSIS — Y929 Unspecified place or not applicable: Secondary | ICD-10-CM | POA: Insufficient documentation

## 2016-05-03 DIAGNOSIS — Z79899 Other long term (current) drug therapy: Secondary | ICD-10-CM | POA: Diagnosis not present

## 2016-05-03 DIAGNOSIS — Y999 Unspecified external cause status: Secondary | ICD-10-CM | POA: Diagnosis not present

## 2016-05-03 DIAGNOSIS — Y939 Activity, unspecified: Secondary | ICD-10-CM | POA: Diagnosis not present

## 2016-05-03 DIAGNOSIS — W19XXXA Unspecified fall, initial encounter: Secondary | ICD-10-CM | POA: Insufficient documentation

## 2016-05-03 DIAGNOSIS — F909 Attention-deficit hyperactivity disorder, unspecified type: Secondary | ICD-10-CM | POA: Diagnosis not present

## 2016-05-03 DIAGNOSIS — S022XXA Fracture of nasal bones, initial encounter for closed fracture: Secondary | ICD-10-CM | POA: Insufficient documentation

## 2016-05-03 DIAGNOSIS — N390 Urinary tract infection, site not specified: Secondary | ICD-10-CM | POA: Diagnosis not present

## 2016-05-03 DIAGNOSIS — S0992XA Unspecified injury of nose, initial encounter: Secondary | ICD-10-CM | POA: Diagnosis present

## 2016-05-03 LAB — URINALYSIS COMPLETE WITH MICROSCOPIC (ARMC ONLY)
BILIRUBIN URINE: NEGATIVE
GLUCOSE, UA: NEGATIVE mg/dL
Ketones, ur: NEGATIVE mg/dL
Nitrite: NEGATIVE
Protein, ur: NEGATIVE mg/dL
SPECIFIC GRAVITY, URINE: 1.002 — AB (ref 1.005–1.030)
pH: 6 (ref 5.0–8.0)

## 2016-05-03 MED ORDER — PHENAZOPYRIDINE HCL 200 MG PO TABS: 200.0000 mg | ORAL_TABLET | Freq: Three times a day (TID) | ORAL | 0 refills | Status: DC | PRN

## 2016-05-03 MED ORDER — SULFAMETHOXAZOLE-TRIMETHOPRIM 800-160 MG PO TABS: 1.0000 | ORAL_TABLET | Freq: Two times a day (BID) | ORAL | 0 refills | Status: DC

## 2016-05-03 NOTE — ED Triage Notes (Signed)
Patient from [facility]. This morning patient was noted to have a nose bleed and staff noticed some bruising and a knot to her forehead.  Patient is AAOx3 to baseline.  Moving all extremities.  NAD.

## 2016-05-03 NOTE — ED Triage Notes (Signed)
Caregiver states while pt was eating her snack around 1030 her nose started bleeding. Bleeding controlled at this time. Pt also has bruise noted above right eye, caregiver unaware how she got the bruise. Caregiver also states pt complained of burning with urination this AM.

## 2016-05-03 NOTE — ED Provider Notes (Signed)
[facility] Emergency Department Provider Note   ____________________________________________   None    (approximate)  I have reviewed the triage vital signs and the nursing notes.   HISTORY  Chief Complaint Epistaxis Your caregiver   HPI ROBEN FRERICH is a 50 y.o. female patient with a bruise above the right nosebleed status post fall. Fall was unwitnessed but did not believe there was loss of consciousness. Patient has a history of falls. Patient also complaining of dysuria this morning. Patient mental status makes it difficult to rate her pain. Nosebleed resolved prior to arrival.   Past Medical History:  Diagnosis Date  . Attention deficit disorder   . Hyponatremia    associated with polydipsia  . Mental retardation   . Obesity   . Seizure disorder ([place])   . Seizures [facility])     Patient Active Problem List   Diagnosis Date Noted  . Encounter for general adult medical examination without abnormal findings 12/17/2015  . Pressure ulcer 06/28/2015  . Sepsis affecting skin 06/27/2015  . Ankle fracture, bimalleolar, closed 05/11/2015  . Ankle fracture 05/08/2015  . 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 Encounter for therapeutic drug monitoring 07/22/2013  . Mental retardation 07/22/2013    Past Surgical History:  Procedure Laterality Date  . ORIF ANKLE FRACTURE Left 05/08/2015   Procedure: OPEN REDUCTION INTERNAL FIXATION (ORIF) ANKLE FRACTURE;  Surgeon: Corky Mull, MD;  Location: [facility] ORS;  Service: Orthopedics;  Laterality: Left;  Marland Kitchen VAGINAL HYSTERECTOMY      Prior to Admission medications   Medication Sig Start Date End Date Taking? Authorizing Provider  bisacodyl (DULCOLAX) 10 MG suppository Place 1 suppository (10 mg total) rectally daily as needed for moderate constipation. 05/10/15   Reche Dixon, PA-C  Calcium Carb-Cholecalciferol (CALCIUM 600 + D PO) Take 600 mg by mouth 2 (two) times daily.     Historical Provider, MD  docusate (COLACE) 50 MG/5ML liquid Take 50 mg by mouth 2 (two) times daily. One teaspoon    Historical Provider, MD  guaifenesin (COUGH SYRUP) 100 MG/5ML syrup Take 200 mg by mouth as needed.     Historical Provider, MD  loratadine (CLARITIN) 10 MG tablet Take 10 mg by mouth daily.    Historical Provider, MD  Multiple Vitamin (MULTIVITAMIN) tablet Take 1 tablet by mouth daily.    Historical Provider, MD  ondansetron (ZOFRAN) 4 MG tablet Take 1 tablet (4 mg total) by mouth every 6 (six) hours as needed for nausea. 05/10/15   Reche Dixon, PA-C  oxyCODONE (OXY IR/ROXICODONE) 5 MG immediate release tablet Take 1 tablet (5 mg total) by mouth every 6 (six) hours as needed for breakthrough pain. 06/30/15   Vaughan Basta, MD  phenazopyridine (PYRIDIUM) 200 MG tablet Take 1 tablet (200 mg total) by mouth 3 (three) times daily as needed for pain. 05/03/16   Sable Feil, PA-C  polyethylene glycol [facility] / GLYCOLAX) packet Take 17 g by mouth daily.    Historical Provider, MD  QUEtiapine (SEROQUEL) 400 MG tablet Take 800 mg by mouth at bedtime.     Historical Provider, MD  risperiDONE (RISPERDAL) 2 MG tablet Take 2 mg by mouth 2 (two) times daily.    Historical Provider, MD  sulfamethoxazole-trimethoprim (BACTRIM DS,SEPTRA DS) 800-160 MG tablet Take 1 tablet by mouth 2 (two) times daily. 05/03/16   Sable Feil, PA-C  Valproate Sodium (DEPAKENE) 250 MG/5ML SOLN solution Take 78mls in AM and 7.58mls in PM 02/02/16  Kathrynn Ducking, MD  vitamin E 200 UNIT capsule Take 200 Units by mouth daily.    Historical Provider, MD    Allergies Review of patient's allergies indicates no known allergies.  Family History  Problem Relation Age of Onset  . Asthma Brother   . Seizures      paternal cousin    Social History Social History  Substance Use Topics  . Smoking status: Never Smoker  . Smokeless tobacco: Never Used  . Alcohol use No    Review of Systems Constitutional:  No fever/chills Eyes: No visual changes. ENT: No sore throat. Cardiovascular: Denies chest pain. Respiratory: Denies shortness of breath. Gastrointestinal: No abdominal pain.  No nausea, no vomiting.  No diarrhea.  No constipation. Genitourinary: Positive for dysuria. Musculoskeletal: Negative for back pain. Skin: Negative for rash. Ecchymosis right supraorbital area with small hematoma Neurological: Negative for headaches, focal weakness or numbness. Convulsive epilepsy. Mental retardation ____________________________________________   PHYSICAL EXAM:  VITAL SIGNS: ED Triage Vitals  Enc Vitals Group     BP 05/03/16 1236 111/67     Pulse Rate 05/03/16 1236 80     Resp 05/03/16 1236 15     Temp --      Temp Source 05/03/16 1236 Oral     SpO2 05/03/16 1236 100 %     Weight 05/03/16 1241 165 lb (74.8 kg)     Height 05/03/16 1241 4\' 9"  (1.448 m)     Head Circumference --      Peak Flow --      Pain Score --      Pain Loc --      Pain Edu? --      Excl. in [place]? --     Constitutional: Alert and oriented. Well appearing and in no acute distress. Eyes: Conjunctivae are normal. PERRL. EOMI. Head: Atraumatic. Nose: No congestion/rhinnorhea. Dry bloody nasal secretions left nostril. Mouth/Throat: Mucous membranes are moist.  Oropharynx non-erythematous. Neck: No stridor.  No cervical spine tenderness to palpation. Hematological/Lymphatic/Immunilogical: No cervical lymphadenopathy. Cardiovascular: Normal rate, regular rhythm. Grossly normal heart sounds.  Good peripheral circulation. Respiratory: Normal respiratory effort.  No retractions. Lungs CTAB. Gastrointestinal: Soft and nontender. No distention. No abdominal bruits. No CVA tenderness. Genitourinary: Deferred Musculoskeletal: No lower extremity tenderness nor edema.  No joint effusions. Neurologic:  Normal speech and language. No gross focal neurologic deficits are appreciated. No gait instability. Skin:  Skin is warm, dry and  intact. No rash noted. Ecchymosis and small hematoma right supraorbital orbital area. Psychiatric: Mood and affect are normal. Speech and behavior are normal.  ____________________________________________   LABS (all labs ordered are listed, but only abnormal results are displayed)  Labs Reviewed  URINALYSIS COMPLETEWITH MICROSCOPIC ([facility] ONLY) - Abnormal; Notable for the following:       Result Value   Color, Urine STRAW (*)    APPearance CLEAR (*)    Specific Gravity, Urine 1.002 (*)    Hgb urine dipstick 3+ (*)    Leukocytes, UA 2+ (*)    Bacteria, UA FEW (*)    Squamous Epithelial / LPF 0-5 (*)    All other components within normal limits  URINE CULTURE   ____________________________________________  EKG   ____________________________________________  RADIOLOGY  Non-displace nasal fracture. ____________________________________________   PROCEDURES  Procedure(s) performed: None  Procedures  Critical Care performed: No  ____________________________________________   INITIAL IMPRESSION / ASSESSMENT AND PLAN / ED COURSE  Pertinent labs & imaging results that were available during my care of  the patient were reviewed by me and considered in my medical decision making (see chart for details).  Nondisplaced nasal fracture and urinary tract infection. Discussed labs and x-ray results with caregiver. Patient given discharge care instructions. Patient a prescription for Pyridium and Bactrim DS. Patient advised to follow-up with Jefm Bryant in one week.  Clinical Course     ____________________________________________   FINAL CLINICAL IMPRESSION(S) / ED DIAGNOSES  Final diagnoses:  Nasal fracture, closed, initial encounter  UTI (lower urinary tract infection)      NEW MEDICATIONS STARTED DURING THIS VISIT:  New Prescriptions   PHENAZOPYRIDINE (PYRIDIUM) 200 MG TABLET    Take 1 tablet (200 mg total) by mouth 3 (three) times daily as needed for pain.    SULFAMETHOXAZOLE-TRIMETHOPRIM (BACTRIM DS,SEPTRA DS) 800-160 MG TABLET    Take 1 tablet by mouth 2 (two) times daily.     Note:  This document was prepared using Dragon voice recognition software and may include unintentional dictation errors.    Sable Feil, PA-C 05/03/16 1411    Schuyler Amor, MD 05/03/16 1500

## 2016-05-05 LAB — URINE CULTURE: SPECIAL REQUESTS: NORMAL

## 2016-05-24 ENCOUNTER — Ambulatory Visit
Admission: RE | Admit: 2016-05-24 | Discharge: 2016-05-24 | Disposition: A | Discharge status: Home / Self Care | Payer: Medicare Other | Source: Ambulatory Visit | Attending: Nurse Practitioner | Admitting: Nurse Practitioner

## 2016-05-24 ENCOUNTER — Other Ambulatory Visit: Payer: Self-pay | Admitting: Nurse Practitioner

## 2016-05-24 DIAGNOSIS — M25562 Pain in left knee: Secondary | ICD-10-CM | POA: Insufficient documentation

## 2016-05-24 DIAGNOSIS — M25561 Pain in right knee: Secondary | ICD-10-CM

## 2016-05-24 DIAGNOSIS — M8580 Other specified disorders of bone density and structure, unspecified site: Secondary | ICD-10-CM | POA: Diagnosis not present

## 2016-06-22 ENCOUNTER — Ambulatory Visit: Payer: Medicare Other | Admitting: Podiatry

## 2016-06-28 ENCOUNTER — Ambulatory Visit (INDEPENDENT_AMBULATORY_CARE_PROVIDER_SITE_OTHER): Payer: Medicare Other | Admitting: Podiatry

## 2016-06-28 ENCOUNTER — Encounter: Payer: Self-pay | Admitting: Podiatry

## 2016-06-28 DIAGNOSIS — M79676 Pain in unspecified toe(s): Secondary | ICD-10-CM

## 2016-06-28 DIAGNOSIS — B351 Tinea unguium: Secondary | ICD-10-CM

## 2016-06-28 NOTE — Progress Notes (Signed)
She presents today with chief complaint of painful elongated toenails 1 through 5 bilateral.  Objective: Pulses are palpable toenails are thick yellow dystrophic mycotic.  Assessment: Pain lives in a onychomycosis.  Plan: We will toenails 1 through 5 bilateral.

## 2016-06-29 ENCOUNTER — Ambulatory Visit: Payer: Medicare Other | Admitting: Podiatry

## 2016-07-15 ENCOUNTER — Telehealth: Payer: Self-pay | Admitting: *Deleted

## 2016-07-15 NOTE — Telephone Encounter (Signed)
I spoke to Linna Hoff from [employer].  Pt has been receiving depakene solution 250mg  /53ml  (taking 68ml AM and 7.34ml PM).  I thanked her for calling back.

## 2016-07-15 NOTE — Telephone Encounter (Signed)
Received fax from Chrystie Nose. Hardin Negus with [employer].  Pt has had recent falls,  Fax states that pt taking valproic acid 1000mg  po bid.  I called and spoke to Linna Hoff with [facility] to check on medication dosing pt has been getting. She was not at the office, but will call me back relating to what pt has been receiving. Pt also is receiving PT for weakness in legs and knees.

## 2016-08-02 ENCOUNTER — Ambulatory Visit
Admission: RE | Admit: 2016-08-02 | Discharge: 2016-08-02 | Disposition: A | Discharge status: Home / Self Care | Payer: Medicare Other | Source: Ambulatory Visit | Attending: Family Medicine | Admitting: Family Medicine

## 2016-08-02 DIAGNOSIS — Z1231 Encounter for screening mammogram for malignant neoplasm of breast: Secondary | ICD-10-CM | POA: Diagnosis present

## 2016-09-28 ENCOUNTER — Encounter: Payer: Self-pay | Admitting: Podiatry

## 2016-09-28 ENCOUNTER — Ambulatory Visit (INDEPENDENT_AMBULATORY_CARE_PROVIDER_SITE_OTHER): Payer: Medicare Other | Admitting: Podiatry

## 2016-09-28 DIAGNOSIS — B351 Tinea unguium: Secondary | ICD-10-CM

## 2016-09-28 DIAGNOSIS — M79676 Pain in unspecified toe(s): Secondary | ICD-10-CM

## 2016-09-28 NOTE — Progress Notes (Signed)
She presents today with a chief complaint of painful elongated toenails.  Objective: Pulses are palpable bilateral. Toenails are thick yellow dystrophic clinic for mycotic painful on palpation.  Assessment: Pain limb secondary to onychomycosis.  Plan: CMA trim her nails today but I evaluated her prior to departure. Toenails are cut thickness and in length and smoothed. Follow up with her in 3 months

## 2016-11-19 ENCOUNTER — Encounter: Payer: Self-pay | Admitting: Emergency Medicine

## 2016-11-19 ENCOUNTER — Emergency Department
Admission: EM | Admit: 2016-11-19 | Discharge: 2016-11-19 | Disposition: A | Discharge status: Home / Self Care | Payer: Medicare Other | Attending: Emergency Medicine | Admitting: Emergency Medicine

## 2016-11-19 ENCOUNTER — Emergency Department: Payer: Medicare Other

## 2016-11-19 DIAGNOSIS — M549 Dorsalgia, unspecified: Secondary | ICD-10-CM | POA: Diagnosis not present

## 2016-11-19 DIAGNOSIS — S0083XA Contusion of other part of head, initial encounter: Secondary | ICD-10-CM | POA: Insufficient documentation

## 2016-11-19 DIAGNOSIS — Y9301 Activity, walking, marching and hiking: Secondary | ICD-10-CM | POA: Insufficient documentation

## 2016-11-19 DIAGNOSIS — S0990XA Unspecified injury of head, initial encounter: Secondary | ICD-10-CM | POA: Diagnosis present

## 2016-11-19 DIAGNOSIS — F909 Attention-deficit hyperactivity disorder, unspecified type: Secondary | ICD-10-CM | POA: Diagnosis not present

## 2016-11-19 DIAGNOSIS — Y929 Unspecified place or not applicable: Secondary | ICD-10-CM | POA: Diagnosis not present

## 2016-11-19 DIAGNOSIS — S5001XA Contusion of right elbow, initial encounter: Secondary | ICD-10-CM | POA: Insufficient documentation

## 2016-11-19 DIAGNOSIS — Y999 Unspecified external cause status: Secondary | ICD-10-CM | POA: Diagnosis not present

## 2016-11-19 DIAGNOSIS — W1809XA Striking against other object with subsequent fall, initial encounter: Secondary | ICD-10-CM | POA: Insufficient documentation

## 2016-11-19 DIAGNOSIS — Z79899 Other long term (current) drug therapy: Secondary | ICD-10-CM | POA: Diagnosis not present

## 2016-11-19 MED ORDER — ACETAMINOPHEN 325 MG PO TABS: 650.0000 mg | ORAL_TABLET | Freq: Three times a day (TID) | ORAL | 0 refills | Status: AC | PRN

## 2016-11-19 NOTE — ED Triage Notes (Signed)
No loc. Pt is special needs and at her baseline per group home. Pt is not on a blood thinner.

## 2016-11-19 NOTE — Discharge Instructions (Signed)
Rest. Drink plenty of fluids.  ° °Follow up with your primary care physician this week. Return to Urgent care for new or worsening concerns.  ° °

## 2016-11-19 NOTE — ED Triage Notes (Signed)
Fell today at 10am - no loc. Bruise to forehead and abrasion to rt elbow

## 2016-11-19 NOTE — ED Provider Notes (Signed)
[facility] Emergency Department Provider Note  Time seen: Approximately 1:08 PM  I have reviewed the triage vital signs and the nursing notes.   HISTORY  Chief Complaint Fall  Historian: Patient and caregivers.  HPI Roberta Hampton is a 51 y.o. female presenting with care home representatives, for evaluation of right elbow bruising and hematoma to right forehead after a witnessed fall. Caregiver reports that patient has a history of frequent falls with an unsteady gait at baseline. Reports as patient was walking to throw something away, she was transitioning between 2 rooms where the flooring changes, and reports she tripped over the carpet edge. Reports she was able to bring her hands up and try to catch herself. Reports she landed on hands and knees but did hit her right forehead on the floor. Denies loss of consciousness.  Reports she was complaining of right elbow pain, but denies pain to elbow now. Patient denies any headache, vision changes or dizziness. Hematoma present to right forehead. Caregivers deny any recent changes to patient, fevers, cough, congestion, dysuria, chest pain, shortness of breath, extremity pain, extremity swelling, abdominal pain. Caregivers report patient at baseline currently. Patient denies any pain at this time. Reports patient has been ambulatory since fall. Reports patient has ate breakfast and snacks today.  PCP: [facility]   Past Medical History:  Diagnosis Date  . Attention deficit disorder   . Hyponatremia    associated with polydipsia  . Mental retardation   . Obesity   . Seizure disorder ([place])   . Seizures [facility])     Patient Active Problem List   Diagnosis Date Noted  . Encounter for general adult medical examination without abnormal findings 12/17/2015  . Pressure ulcer 06/28/2015  . Sepsis affecting skin ([place]) 06/27/2015  . Ankle fracture, bimalleolar, closed 05/11/2015  . Ankle fracture 05/08/2015  .  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 Encounter for therapeutic drug monitoring 07/22/2013  . Mental retardation 07/22/2013    Past Surgical History:  Procedure Laterality Date  . ABDOMINAL HYSTERECTOMY    . ORIF ANKLE FRACTURE Left 05/08/2015   Procedure: OPEN REDUCTION INTERNAL FIXATION (ORIF) ANKLE FRACTURE;  Surgeon: Corky Mull, MD;  Location: [facility] ORS;  Service: Orthopedics;  Laterality: Left;  Marland Kitchen VAGINAL HYSTERECTOMY       No current facility-administered medications for this encounter.   Current Outpatient Prescriptions:  .  acetaminophen (TYLENOL) 325 MG tablet, Take 2 tablets (650 mg total) by mouth every 8 (eight) hours as needed for mild pain., Disp: 20 tablet, Rfl: 0 .  bisacodyl (DULCOLAX) 10 MG suppository, Place 1 suppository (10 mg total) rectally daily as needed for moderate constipation., Disp: 12 suppository, Rfl: 0 .  Calcium Carb-Cholecalciferol (CALCIUM 600 + D PO), Take 600 mg by mouth 2 (two) times daily., Disp: , Rfl:  .  docusate (COLACE) 50 MG/5ML liquid, Take 50 mg by mouth 2 (two) times daily. One teaspoon, Disp: , Rfl:  .  guaifenesin (COUGH SYRUP) 100 MG/5ML syrup, Take 200 mg by mouth as needed. , Disp: , Rfl:  .  loratadine (CLARITIN) 10 MG tablet, Take 10 mg by mouth daily., Disp: , Rfl:  .  Multiple Vitamin (MULTIVITAMIN) tablet, Take 1 tablet by mouth daily., Disp: , Rfl:  .  ondansetron (ZOFRAN) 4 MG tablet, Take 1 tablet (4 mg total) by mouth every 6 (six) hours as needed for nausea., Disp: 20 tablet, Rfl: 0 .  oxyCODONE (OXY IR/ROXICODONE) 5  MG immediate release tablet, Take 1 tablet (5 mg total) by mouth every 6 (six) hours as needed for breakthrough pain., Disp: 20 tablet, Rfl: 0 .  phenazopyridine (PYRIDIUM) 200 MG tablet, Take 1 tablet (200 mg total) by mouth 3 (three) times daily as needed for pain., Disp: 6 tablet, Rfl: 0 .  polyethylene glycol (MIRALAX / GLYCOLAX) packet, Take 17 g by mouth daily., Disp: ,  Rfl:  .  QUEtiapine (SEROQUEL) 400 MG tablet, Take 800 mg by mouth at bedtime. , Disp: , Rfl:  .  risperiDONE (RISPERDAL) 2 MG tablet, Take 2 mg by mouth 2 (two) times daily., Disp: , Rfl:  .  sulfamethoxazole-trimethoprim (BACTRIM DS,SEPTRA DS) 800-160 MG tablet, Take 1 tablet by mouth 2 (two) times daily., Disp: 20 tablet, Rfl: 0 .  Valproate Sodium (DEPAKENE) 250 MG/5ML SOLN solution, Take 48mls in AM and 7.24mls in PM, Disp: 600 mL, Rfl: 11 .  vitamin E 200 UNIT capsule, Take 200 Units by mouth daily., Disp: , Rfl:   Allergies Patient has no known allergies.  Family History  Problem Relation Age of Onset  . Asthma Brother   . Seizures      paternal cousin    Social History Social History  Substance Use Topics  . Smoking status: Never Smoker  . Smokeless tobacco: Never Used  . Alcohol use No    Review of Systems per patient and historians.   Constitutional: No fever/chills Eyes: No visual changes. ENT: No sore throat. Cardiovascular: Denies chest pain. Respiratory: Denies shortness of breath. Gastrointestinal: No abdominal pain.  No nausea, no vomiting.  No diarrhea.  No constipation. Genitourinary: Negative for dysuria. Musculoskeletal: Denies neck or back pain. Skin: Negative for rash. Neurological: Negative for focal weakness or numbness.  10-point ROS otherwise negative.  ____________________________________________   PHYSICAL EXAM:  VITAL SIGNS: ED Triage Vitals  Enc Vitals Group     BP 11/19/16 1222 101/62     Pulse Rate 11/19/16 1222 79     Resp 11/19/16 1222 16     Temp 11/19/16 1222 97.9 F (36.6 C)     Temp Source 11/19/16 1222 Oral     SpO2 11/19/16 1222 98 %     Weight 11/19/16 1222 134 lb (60.8 kg)     Height 11/19/16 1222 5\' 2"  (1.575 m)     Head Circumference --      Peak Flow --      Pain Score 11/19/16 1225 4     Pain Loc --      Pain Edu? --      Excl. in [place]? --     Constitutional: Alert and oriented. Baseline per caregivers.  Well  appearing and in no acute distress. Eyes: Conjunctivae are normal. PERRL. EOMI. ENT      Head: Normocephalic. Right frontal hematoma present, minimal tenderness to palpation, head otherwise nontender. Skin intact.       Nose: No congestion/rhinnorhea.      Mouth/Throat: Mucous membranes are moist.Oropharynx non-erythematous. Neck: No stridor. Supple without meningismus.  Hematological/Lymphatic/Immunilogical: No cervical lymphadenopathy. Cardiovascular: Normal rate, regular rhythm. Grossly normal heart sounds.  Good peripheral circulation. Respiratory: Normal respiratory effort without tachypnea nor retractions. Breath sounds are clear and equal bilaterally. No wheezes, rales, rhonchi. Gastrointestinal: Soft and nontender. No distention.No CVA tenderness. Musculoskeletal:  Steady gait. No rib tenderness to palpation. No cervical tenderness to palpation. Patient ambulatory in room with steady gait. Patient able to stand and perform bilateral knee lifts without discomfort distress noted as  well as to have full lumbar flexion and extension as well as rotation without report of pain or distress.Sensation intact to bilateral lower extremities. Bilateral pedal pulses equal and easily palpated.  Except : Lower thoracic and diffuse lumbar mild tenderness to palpation midline, no surrounding tenderness, no erythema, no ecchymosis, full range of motion present Except : Right lateral elbow and posterior proximal for forearm mild ecchymosis, minimal tenderness to palpation, full range of motion present, no deformity noted. Bilateral hand grips strong and equal. Bilateral distal radial pulses equal. Right upper combination the otherwise nontender.  Neurologic:  Normal speech and language.  Speech is normal. No gait instability. No focal neurological deficits. GCS 15. Skin:  Skin is warm, dry and intact. No rash noted. Psychiatric: Mood and affect are normal. Speech and behavior are normal. Patient exhibits  appropriate insight and judgment   ___________________________________________   LABS (all labs ordered are listed, but only abnormal results are displayed)  Labs Reviewed - No data to display ____________________________________________   RADIOLOGY  Dg Thoracic Spine 2 View  Result Date: 11/19/2016 CLINICAL DATA:  Fall today with upper and lower back pain. EXAM: THORACIC SPINE 2 VIEWS COMPARISON:  None. FINDINGS: Vertebral body alignment is within normal. There is mild spondylosis of the spine. Pedicles are intact. There is mild anterior wedging of a mid thoracic vertebral body with mild generalized loss of height of the subsequent two inferior thoracic brick vertebrae age indeterminate. Remainder of the exam is unremarkable. IMPRESSION: Three consecutive mild compression deformities over the mid to lower thoracic spine age indeterminate. Electronically Signed   By: Marin Olp M.D.   On: 11/19/2016 13:41   Dg Lumbar Spine Complete  Result Date: 11/19/2016 CLINICAL DATA:  Fall today with low back pain. EXAM: LUMBAR SPINE - COMPLETE 4+ VIEW COMPARISON:  05/08/2015 FINDINGS: Vertebral body alignment and heights are within normal. Minimal spondylosis of the lumbar spine. Subtle multilevel disc space narrowing. No compression fracture or subluxation. IMPRESSION: No acute findings. Electronically Signed   By: Marin Olp M.D.   On: 11/19/2016 13:42   Dg Elbow Complete Right  Result Date: 11/19/2016 CLINICAL DATA:  Status post fall with pain. EXAM: RIGHT ELBOW - COMPLETE 3+ VIEW COMPARISON:  None. FINDINGS: There is no evidence of fracture, dislocation, or joint effusion. There is no evidence of arthropathy or other focal bone abnormality. Soft tissues are unremarkable. IMPRESSION: Negative. Electronically Signed   By: Fidela Salisbury M.D.   On: 11/19/2016 13:37   Ct Head Wo Contrast  Result Date: 11/19/2016 CLINICAL DATA:  Right frontal hematoma following fall. EXAM: CT HEAD WITHOUT  CONTRAST TECHNIQUE: Contiguous axial images were obtained from the base of the skull through the vertex without intravenous contrast. COMPARISON:  None. FINDINGS: Brain: Gray-white differentiation is maintained. No CT evidence of acute large territory infarct. No intraparenchymal or extra-axial mass or hemorrhage. Normal size and configuration of the ventricles and the basilar cisterns. No midline shift. Vascular: No hyperdense vessel or unexpected calcification. Skull: No displaced calvarial fracture with special attention paid to the right-side of the forehead. Sinuses/Orbits: There is underpneumatization the bilateral frontal sinuses. The remaining paranasal sinuses and mastoid air cells are normally aerated. No air-fluid levels. Other: There is an approximately 3.7 x 0.7 cm hematoma about the right-side of the forehead without associated radiopaque foreign body. Note is made of a punctate (approximately 2 mm) radiopaque structure imbedded within the subcutaneous tissues about the right parietal calvarium (image 53, series 5), without associated subcutaneous edema or  emphysema, likely the sequela of remote injury. IMPRESSION: Approximately 3.7 cm hematoma about the right-side of the forehead without associated radiopaque foreign body, displaced calvarial fracture or acute intracranial process. Electronically Signed   By: Sandi Mariscal M.D.   On: 11/19/2016 13:28   Ct Thoracic Spine Wo Contrast  Result Date: 11/19/2016 CLINICAL DATA:  Back pain following a fall today. Three mild compression deformities in the mid to lower thoracic spine on thoracic spine radiographs today. No comparison exams at that time. Special needs patient who had difficulty remaining still for the examination. EXAM: CT THORACIC SPINE WITHOUT CONTRAST TECHNIQUE: Multidetector CT images of the thoracic were obtained using the standard protocol without intravenous contrast. COMPARISON:  Thoracic spine radiographs obtained earlier today.  FINDINGS: Alignment: Mild scoliosis.  No subluxations. Vertebrae: Limited evaluation due to patient motion artifacts. The T12 vertebra is not included. The previously noted mild compression deformities are involving the T7, T8 and T9 vertebral bodies. There is approximately 20% compression of those vertebral bodies with no acute fracture lines. No bony retropulsion. No fractures or subluxations seen at any other levels. Paraspinal and other soft tissues: Unremarkable. Disc levels: Mild anterior spur formation at multiple levels with mild Schmorl's node formation. No canal stenosis. IMPRESSION: 1. No acute fracture or subluxation. 2. Mild, old T7, T8 and T9 vertebral compression deformities. 3. Mild multilevel degenerative changes. 4. Limitation due to patient motion artifacts. Electronically Signed   By: Claudie Revering M.D.   On: 11/19/2016 14:40   ____________________________________________   PROCEDURES Procedures     INITIAL IMPRESSION / ASSESSMENT AND PLAN / ED COURSE  Pertinent labs & imaging results that were available during my care of the patient were reviewed by me and considered in my medical decision making (see chart for details).  Well-appearing patient. No acute distress. No focal neurological deficit. Presents with caregivers at bedside for complaints of right frontal hematoma and right elbow ecchymosis post mechanical fall. Caregivers report witnessed fall of patient tripping over a change in flooring and falling forward. Will evaluate CT head and right elbow. Also some lower thoracic and lumbar tenderness on exam but inconsistent. Will evaluate thoracic and lumbar x-ray.   Per radiologist, CT head negative, lumbar spine negative as well as right elbow negative. Thoracic spine showing concern of compression fractures age indeterminate, will evaluate CT thoracic spine for to further characterize.  Per radiologist CT of thoracic spine, no acute bony abnormality, mild old T7, T8 and T9  for TB or compression deformities, mild multilevel degenerative changes. Discussed this with patient and caregivers. Encouraged supportive care, rest, ice and stretching and avoidance of reinjury. Over-the-counter Tylenol as needed. Follow up with primary care physician this week for follow-up.  Discussed follow up with Primary care physician this week. Discussed follow up and return parameters including no resolution or any worsening concerns. Patient and caregivers verbalized understanding and agreed to plan.   ____________________________________________   FINAL CLINICAL IMPRESSION(S) / ED DIAGNOSES  Final diagnoses:  Traumatic hematoma of forehead, initial encounter  Contusion of right elbow, initial encounter  Acute back pain, unspecified back location, unspecified back pain laterality     Discharge Medication List as of 11/19/2016  2:51 PM      Note: This dictation was prepared with Dragon dictation along with smaller phrase technology. Any transcriptional errors that result from this process are unintentional.         Marylene Land, NP 11/19/16 [place], MD 11/20/16 [phone]

## 2016-12-26 ENCOUNTER — Ambulatory Visit (INDEPENDENT_AMBULATORY_CARE_PROVIDER_SITE_OTHER): Payer: Medicare Other | Admitting: Podiatry

## 2016-12-26 DIAGNOSIS — M79676 Pain in unspecified toe(s): Secondary | ICD-10-CM

## 2016-12-26 DIAGNOSIS — B351 Tinea unguium: Secondary | ICD-10-CM

## 2016-12-26 NOTE — Progress Notes (Signed)
Complaint:  Visit Type: Patient returns to my office for continued preventative foot care services. Complaint: Patient states" my nails have grown long and thick and become painful to walk and wear shoes"  The patient presents for preventative foot care services.   Podiatric Exam: Vascular: dorsalis pedis and posterior tibial pulses are palpable bilateral. Capillary return is immediate. Temperature gradient is WNL. Skin turgor WNL  Sensorium: Normal Semmes Weinstein monofilament test. Normal tactile sensation bilaterally. Nail Exam: Pt has thick disfigured discolored nails with subungual debris noted bilateral entire nail hallux through fifth toenails Ulcer Exam: There is no evidence of ulcer or pre-ulcerative changes or infection. Orthopedic Exam: Muscle tone and strength are WNL. No limitations in general ROM. No crepitus or effusions noted. Foot type and digits show no abnormalities. Bony prominences are unremarkable. Skin: No Porokeratosis. No infection or ulcers  Diagnosis:  Onychomycosis, , Pain in right toe, pain in left toes  Treatment & Plan Procedures and Treatment: Consent by patient was obtained for treatment procedures. The patient understood the discussion of treatment and procedures well. All questions were answered thoroughly reviewed. Debridement of mycotic and hypertrophic toenails, 1 through 5 bilateral and clearing of subungual debris. No ulceration, no infection noted.  Return Visit-Office Procedure: Patient instructed to return to the office for a follow up visit 3 months for continued evaluation and treatment.    Gardiner Barefoot DPM

## 2016-12-28 ENCOUNTER — Ambulatory Visit: Payer: Medicare Other | Admitting: Podiatry

## 2017-01-31 ENCOUNTER — Ambulatory Visit: Payer: Medicare Other | Admitting: Neurology

## 2017-02-13 ENCOUNTER — Other Ambulatory Visit: Payer: Self-pay | Admitting: Neurology

## 2017-03-30 ENCOUNTER — Encounter: Payer: Self-pay | Admitting: Podiatry

## 2017-03-30 ENCOUNTER — Ambulatory Visit (INDEPENDENT_AMBULATORY_CARE_PROVIDER_SITE_OTHER): Payer: Medicare Other | Admitting: Podiatry

## 2017-03-30 DIAGNOSIS — M79676 Pain in unspecified toe(s): Secondary | ICD-10-CM | POA: Diagnosis not present

## 2017-03-30 DIAGNOSIS — B351 Tinea unguium: Secondary | ICD-10-CM

## 2017-03-30 NOTE — Progress Notes (Signed)
Complaint:  Visit Type: Patient returns to my office for continued preventative foot care services. Complaint: Patient states" my nails have grown long and thick and become painful to walk and wear shoes"  The patient presents for preventative foot care services.   Podiatric Exam: Vascular: dorsalis pedis and posterior tibial pulses are palpable bilateral. Capillary return is immediate. Temperature gradient is WNL. Skin turgor WNL  Sensorium: Normal Semmes Weinstein monofilament test. Normal tactile sensation bilaterally. Nail Exam: Pt has thick disfigured discolored nails with subungual debris noted bilateral entire nail hallux through fifth toenails Ulcer Exam: There is no evidence of ulcer or pre-ulcerative changes or infection. Orthopedic Exam: Muscle tone and strength are WNL. No limitations in general ROM. No crepitus or effusions noted. Foot type and digits show no abnormalities. Bony prominences are unremarkable. Skin: No Porokeratosis. No infection or ulcers  Diagnosis:  Onychomycosis, , Pain in right toe, pain in left toes  Treatment & Plan Procedures and Treatment: Consent by patient was obtained for treatment procedures. The patient understood the discussion of treatment and procedures well. All questions were answered thoroughly reviewed. Debridement of mycotic and hypertrophic toenails, 1 through 5 bilateral and clearing of subungual debris. No ulceration, no infection noted.  Return Visit-Office Procedure: Patient instructed to return to the office for a follow up visit 3 months for continued evaluation and treatment.    Gardiner Barefoot DPM

## 2017-04-28 ENCOUNTER — Ambulatory Visit (INDEPENDENT_AMBULATORY_CARE_PROVIDER_SITE_OTHER): Payer: Medicare Other | Admitting: Neurology

## 2017-04-28 ENCOUNTER — Encounter: Payer: Self-pay | Admitting: Neurology

## 2017-04-28 VITALS — BP 92/60 | HR 78 | Ht 62.0 in | Wt 143.0 lb

## 2017-04-28 DIAGNOSIS — Z5181 Encounter for therapeutic drug level monitoring: Secondary | ICD-10-CM

## 2017-04-28 DIAGNOSIS — G40319 Generalized idiopathic epilepsy and epileptic syndromes, intractable, without status epilepticus: Secondary | ICD-10-CM | POA: Diagnosis not present

## 2017-04-28 DIAGNOSIS — F79 Unspecified intellectual disabilities: Secondary | ICD-10-CM

## 2017-04-28 NOTE — Progress Notes (Signed)
Reason for visit: Seizures  Roberta Hampton is an 51 y.o. female  History of present illness:  Roberta Hampton is a 51 year old right-handed white female with a history of mental retardation and a history of seizures. The patient has some problems with agitated behavior at times. The patient is on Depakote for the seizures, she has done quite well, the last seizure was in February 2016. The patient has seizures associated with staring events. The patient has had some increase in daytime drowsiness, she has difficulty staying awake. The patient is on 6 mg daily Risperdal and she is on 800 mg of Seroquel at night. She sleeps well at night, but she is also sleepy during the daytime. The patient is having urinary frequency, she is having frequent urinary tract infections, she will be seeing a urology physician in the near future. She has had episodes of falls, she has had 2 emergency room visits because of falls since last seen, one on 05/03/2016, another one on 11/19/2016. The patient has undergone physical therapy, she is now using a walker, she has had a walker for 6-8 months and she has not had any falls while using it. She needs to be reminded frequently to use the walker, however.  Past Medical History:  Diagnosis Date  . Attention deficit disorder   . Hyponatremia    associated with polydipsia  . Mental retardation   . Obesity   . Seizure disorder ([place])   . Seizures ([place])     Past Surgical History:  Procedure Laterality Date  . ABDOMINAL HYSTERECTOMY    . ORIF ANKLE FRACTURE Left 05/08/2015   Procedure: OPEN REDUCTION INTERNAL FIXATION (ORIF) ANKLE FRACTURE;  Surgeon: Corky Mull, MD;  Location: [facility] ORS;  Service: Orthopedics;  Laterality: Left;  Marland Kitchen VAGINAL HYSTERECTOMY      Family History  Problem Relation Age of Onset  . Asthma Brother   . Seizures Unknown        paternal cousin    Social history:  reports that she has never smoked. She has never used smokeless tobacco. She  reports that she does not drink alcohol or use drugs.   No Known Allergies  Medications:  Prior to Admission medications   Medication Sig Start Date End Date Taking? Authorizing Provider  acetaminophen (TYLENOL) 325 MG tablet Take 2 tablets (650 mg total) by mouth every 8 (eight) hours as needed for mild pain. 11/19/16 11/19/17 Yes Marylene Land, NP  Calcium Carb-Cholecalciferol (CALCIUM 600 + D PO) Take 600 mg by mouth 2 (two) times daily.   Yes [provider]  Docusate Sodium (DIOCTO) 150 MG/15ML syrup Take 50 mg by mouth 2 (two) times daily.   Yes [provider]  fluvoxaMINE (LUVOX) 25 MG tablet Take 25 mg by mouth at bedtime.   Yes [provider]  loratadine (CLARITIN) 10 MG tablet Take 10 mg by mouth daily.   Yes [provider]  Multiple Vitamin (THEREMS PO) Take 1 tablet by mouth daily.   Yes [provider]  QUEtiapine (SEROQUEL) 400 MG tablet Take 800 mg by mouth at bedtime.    Yes [provider]  risperiDONE (RISPERDAL) 1 MG tablet Take 1 mg by mouth every morning.   Yes [provider]  risperidone (RISPERDAL) 4 MG tablet Take 4 mg by mouth at bedtime.   Yes [provider]  Valproate Sodium (DEPAKENE) 250 MG/5ML SOLN solution TAKE 10ML (500MG ) BY MOUTH EVERY MORNING TAKE 7.5ML (375MG ) BY MOUTH AT  BEDTIME 02/13/17  Yes Kathrynn Ducking, MD  vitamin E 200 UNIT capsule Take 200 Units by mouth daily.   Yes [provider]  bisacodyl (DULCOLAX) 10 MG suppository Place 1 suppository (10 mg total) rectally daily as needed for moderate constipation. 05/10/15   Reche Dixon, PA-C  docusate (COLACE) 50 MG/5ML liquid Take 50 mg by mouth 2 (two) times daily. One teaspoon    [provider]  Multiple Vitamin (MULTIVITAMIN) tablet Take 1 tablet by mouth daily.    [provider]  ondansetron (ZOFRAN) 4 MG tablet Take 1 tablet (4 mg total) by mouth every 6 (six) hours as needed for nausea. 05/10/15    Reche Dixon, PA-C  oxyCODONE (OXY IR/ROXICODONE) 5 MG immediate release tablet Take 1 tablet (5 mg total) by mouth every 6 (six) hours as needed for breakthrough pain. 06/30/15   Vaughan Basta, MD  phenazopyridine (PYRIDIUM) 200 MG tablet Take 1 tablet (200 mg total) by mouth 3 (three) times daily as needed for pain. 05/03/16   Sable Feil, PA-C  polyethylene glycol [facility] / GLYCOLAX) packet Take 17 g by mouth daily.    [provider]  sulfamethoxazole-trimethoprim (BACTRIM DS,SEPTRA DS) 800-160 MG tablet Take 1 tablet by mouth 2 (two) times daily. 05/03/16   Sable Feil, PA-C    ROS:  Out of a complete 14 system review of symptoms, the patient complains only of the following symptoms, and all other reviewed systems are negative.  Seizures  There were no vitals taken for this visit.  Physical Exam  General: The patient is alert and cooperative at the time of the examination.  Skin: No significant peripheral edema is noted.   Neurologic Exam  Mental status: The patient is alert and oriented x 3 at the time of the examination. The patient has apparent normal recent and remote memory, with an apparently normal attention span and concentration ability.   Cranial nerves: Facial symmetry is present. Speech is normal, no aphasia or dysarthria is noted. Extraocular movements are full. Visual fields are full to threat.  Motor: The patient has good strength in all 4 extremities.  Sensory examination: Soft touch sensation is symmetric on the face, arms, and legs.  Coordination: The patient has good finger-nose-finger and heel-to-shin bilaterally. The patient has some problems with apraxia with use of the extremities.  Gait and station: The patient has a slightly wide-based gait. The patient uses a walker for ambulation. The patient can walk independently. Romberg is negative. Tandem gait was not attempted.  Reflexes: Deep tendon reflexes are symmetric.   CT  head 11/19/16:  IMPRESSION: Approximately 3.7 cm hematoma about the right-side of the forehead without associated radiopaque foreign body, displaced calvarial fracture or acute intracranial process.  * CT scan images were reviewed online. I agree with the written report.    Assessment/Plan:  1. Seizures  2. Mental retardation  3. Gait disorder  4. Excessive daytime drowsiness.  The patient is doing well with her seizures, she is having excessive daytime drowsiness currently, we will check blood work today, if the blood work is unremarkable, we may consider a referral to a sleep center, the caretaker would prefer a sleep center in [place], [place], the "[facility]". Otherwise, the patient follow-up in one year. The patient is on Seroquel 800 mg and Risperdal 6 mg daily, these medications may also cause sedation.  Jill Alexanders MD 04/28/2017 8:03 AM  [facility] [address] [place],  [place] [address]  Phone [phone] Fax [phone]

## 2017-04-29 LAB — COMPREHENSIVE METABOLIC PANEL WITH GFR
ALT: 8 IU/L (ref 0–32)
AST: 14 IU/L (ref 0–40)
Albumin/Globulin Ratio: 2.3 — ABNORMAL HIGH (ref 1.2–2.2)
Albumin: 3.6 g/dL (ref 3.5–5.5)
Alkaline Phosphatase: 45 IU/L (ref 39–117)
BUN/Creatinine Ratio: 23 (ref 9–23)
BUN: 17 mg/dL (ref 6–24)
Bilirubin Total: 0.3 mg/dL (ref 0.0–1.2)
CO2: 29 mmol/L (ref 20–29)
Calcium: 9.3 mg/dL (ref 8.7–10.2)
Chloride: 105 mmol/L (ref 96–106)
Creatinine, Ser: 0.73 mg/dL (ref 0.57–1.00)
GFR calc Af Amer: 110 mL/min/1.73
GFR calc non Af Amer: 96 mL/min/1.73
Globulin, Total: 1.6 g/dL (ref 1.5–4.5)
Glucose: 72 mg/dL (ref 65–99)
Potassium: 4 mmol/L (ref 3.5–5.2)
Sodium: 144 mmol/L (ref 134–144)
Total Protein: 5.2 g/dL — ABNORMAL LOW (ref 6.0–8.5)

## 2017-04-29 LAB — CBC WITH DIFFERENTIAL/PLATELET
BASOS: 0 %
Basophils Absolute: 0 10*3/uL (ref 0.0–0.2)
EOS (ABSOLUTE): 0 10*3/uL (ref 0.0–0.4)
Eos: 1 %
HEMATOCRIT: 40.7 % (ref 34.0–46.6)
Hemoglobin: 13.4 g/dL (ref 11.1–15.9)
IMMATURE GRANS (ABS): 0 10*3/uL (ref 0.0–0.1)
Immature Granulocytes: 0 %
LYMPHS: 55 %
Lymphocytes Absolute: 1.3 10*3/uL (ref 0.7–3.1)
MCH: 32.6 pg (ref 26.6–33.0)
MCHC: 32.9 g/dL (ref 31.5–35.7)
MCV: 99 fL — AB (ref 79–97)
Monocytes Absolute: 0.4 10*3/uL (ref 0.1–0.9)
Monocytes: 15 %
NEUTROS ABS: 0.7 10*3/uL — AB (ref 1.4–7.0)
NEUTROS PCT: 29 %
Platelets: 113 10*3/uL — ABNORMAL LOW (ref 150–379)
RBC: 4.11 x10E6/uL (ref 3.77–5.28)
RDW: 14.1 % (ref 12.3–15.4)
WBC: 2.4 10*3/uL — CL (ref 3.4–10.8)

## 2017-04-29 LAB — VALPROIC ACID LEVEL: Valproic Acid Lvl: 100 ug/mL (ref 50–100)

## 2017-04-29 LAB — AMMONIA: Ammonia: 61 ug/dL (ref 19–87)

## 2017-04-30 ENCOUNTER — Telehealth: Payer: Self-pay | Admitting: Neurology

## 2017-04-30 DIAGNOSIS — Z5181 Encounter for therapeutic drug level monitoring: Secondary | ICD-10-CM

## 2017-04-30 NOTE — Telephone Encounter (Signed)
I called the patient. The blood work shows a low WBC, and low platelet levels, good VPA level. The platelet level is stable, the WBC is lower than usual, I will recheck in one month.

## 2017-05-24 NOTE — Addendum Note (Signed)
Addended by: Kathrynn Ducking on: 05/24/2017 05:35 PM   Modules accepted: Orders

## 2017-05-24 NOTE — Telephone Encounter (Signed)
I called and left a message, the patient can be brought by anytime to get the blood work done.

## 2017-05-24 NOTE — Telephone Encounter (Signed)
Pt's caregiver Linna Hoff called in she is wanting to bring the pt in for recheck of labs. I did not see an order so she will wait until RN calls to let her know it has been entered.

## 2017-06-08 ENCOUNTER — Other Ambulatory Visit (INDEPENDENT_AMBULATORY_CARE_PROVIDER_SITE_OTHER): Payer: Self-pay

## 2017-06-08 DIAGNOSIS — Z5181 Encounter for therapeutic drug level monitoring: Secondary | ICD-10-CM

## 2017-06-08 DIAGNOSIS — Z0289 Encounter for other administrative examinations: Secondary | ICD-10-CM

## 2017-06-09 ENCOUNTER — Telehealth: Payer: Self-pay | Admitting: Neurology

## 2017-06-09 LAB — CBC WITH DIFFERENTIAL/PLATELET
BASOS: 1 %
Basophils Absolute: 0 10*3/uL (ref 0.0–0.2)
EOS (ABSOLUTE): 0.1 10*3/uL (ref 0.0–0.4)
EOS: 1 %
HEMATOCRIT: 41.9 % (ref 34.0–46.6)
HEMOGLOBIN: 14.1 g/dL (ref 11.1–15.9)
IMMATURE GRANULOCYTES: 1 %
Immature Grans (Abs): 0 10*3/uL (ref 0.0–0.1)
LYMPHS ABS: 1.3 10*3/uL (ref 0.7–3.1)
Lymphs: 36 %
MCH: 32.1 pg (ref 26.6–33.0)
MCHC: 33.7 g/dL (ref 31.5–35.7)
MCV: 95 fL (ref 79–97)
MONOS ABS: 0.9 10*3/uL (ref 0.1–0.9)
Monocytes: 26 %
NEUTROS PCT: 35 %
Neutrophils Absolute: 1.3 10*3/uL — ABNORMAL LOW (ref 1.4–7.0)
Platelets: 88 10*3/uL — CL (ref 150–379)
RBC: 4.39 x10E6/uL (ref 3.77–5.28)
RDW: 13.7 % (ref 12.3–15.4)
WBC: 3.6 10*3/uL (ref 3.4–10.8)

## 2017-06-09 NOTE — Telephone Encounter (Signed)
I called about the blood work. The white blood count is now back to normal, the level remains low, down to 88, but this issue has been noted previously over the last several years. This remained stable.

## 2017-06-27 ENCOUNTER — Other Ambulatory Visit: Payer: Self-pay | Admitting: Family Medicine

## 2017-06-27 DIAGNOSIS — Z1231 Encounter for screening mammogram for malignant neoplasm of breast: Secondary | ICD-10-CM

## 2017-06-29 ENCOUNTER — Ambulatory Visit (INDEPENDENT_AMBULATORY_CARE_PROVIDER_SITE_OTHER): Payer: Medicare Other | Admitting: Podiatry

## 2017-06-29 ENCOUNTER — Encounter: Payer: Self-pay | Admitting: Podiatry

## 2017-06-29 ENCOUNTER — Ambulatory Visit: Payer: Medicare Other | Admitting: Podiatry

## 2017-06-29 DIAGNOSIS — M79676 Pain in unspecified toe(s): Secondary | ICD-10-CM

## 2017-06-29 DIAGNOSIS — B351 Tinea unguium: Secondary | ICD-10-CM

## 2017-06-29 NOTE — Progress Notes (Addendum)
Complaint:  Visit Type: Patient returns to my office for continued preventative foot care services. Complaint: Patient states" my nails have grown long and thick and become painful to walk and wear shoes"  The patient presents for preventative foot care services.   Podiatric Exam: Vascular: dorsalis pedis and posterior tibial pulses are palpable bilateral. Capillary return is immediate. Temperature gradient is WNL. Skin turgor WNL  Sensorium: Normal Semmes Weinstein monofilament test. Normal tactile sensation bilaterally. Nail Exam: Pt has thick disfigured discolored nails with subungual debris noted bilateral entire nail hallux through fifth toenails Ulcer Exam: There is no evidence of ulcer or pre-ulcerative changes or infection. Orthopedic Exam: Muscle tone and strength are WNL. No limitations in general ROM. No crepitus or effusions noted. Foot type and digits show no abnormalities. Bony prominences are unremarkable. Skin: No Porokeratosis. No infection or ulcers  Diagnosis:  Onychomycosis, , Pain in right toe, pain in left toes  Treatment & Plan Procedures and Treatment: Consent by patient was obtained for treatment procedures. The patient understood the discussion of treatment and procedures well. All questions were answered thoroughly reviewed. Debridement of mycotic and hypertrophic toenails, 1 through 5 bilateral and clearing of subungual debris. No ulceration, no infection noted. Patient pulled her foot back during treatment and even place a fragment of nail in her mouth.  No grinding was performed . ABN signed for 2018. Return Visit-Office Procedure: Patient instructed to return to the office for a follow up visit 3 months for continued evaluation and treatment.    Gardiner Barefoot DPM

## 2017-07-11 ENCOUNTER — Ambulatory Visit: Payer: Self-pay | Admitting: Urology

## 2017-07-11 NOTE — Progress Notes (Signed)
07/12/2017 10:53 AM   [name] 1966/04/16 [id number]  Referring provider: [name] [address] [place], [place] [address]  Chief Complaint  Patient presents with  . Urinary Tract Infection    HPI: Patient is a 51 -year-old Caucasian female who is referred to [place] by, Dr. Sherril Cong, for recurrent urinary tract infections.  Patient is a poor historian.    Patient states that she has had 6 urinary tract infections over the last year.  Reviewing her records,  she has had one documented positive urine culture.   Her care giver Her symptoms with a urinary tract infection consist of dysuria, urgency, nocturia, incontinence and frequency.     She denies dysuria, gross hematuria, suprapubic pain, back pain, abdominal pain or flank pain.  She has not had any recent fevers, chills, nausea or vomiting.   She does not have a history of nephrolithiasis, GU surgery or GU trauma.   She is not sexually active.    She is postmenopausal.  She admits to constipation.  She does not engage in good perineal hygiene. She does not take tub baths.  She has incontinence.  She is using incontinence pads, 3 to 4 daily.    She is drinking water daily, but she prefers sodas.  Unsure of the amount of water she drinks.  Is having cranberry juice.   Her CATH UA was positive for 11-30 WBC's, 11-30 RBC's and many bacteria.   PVR is 15 mL.    Reviewed referral notes.    Proteus UTI in 04/2017  PMH: Past Medical History:  Diagnosis Date  . Attention deficit disorder   . Hyponatremia    associated with polydipsia  . Mental retardation   . Obesity   . Seizure disorder ([place])   . Seizures [facility])     Surgical History: Past Surgical History:  Procedure Laterality Date  . ABDOMINAL HYSTERECTOMY    . VAGINAL HYSTERECTOMY      Home Medications:  Allergies as of 07/12/2017   No Known Allergies     Medication List        Accurate as of 07/12/17 10:53 AM. Always use your most recent  med list.          acetaminophen 325 MG tablet Commonly known as:  TYLENOL Take 2 tablets (650 mg total) by mouth every 8 (eight) hours as needed for mild pain.   CALCIUM 600 + D PO Take 600 mg by mouth 2 (two) times daily.   conjugated estrogens vaginal cream Commonly known as:  PREMARIN Place 1 Applicatorful daily vaginally. Apply 0.'5mg'$  (pea-sized amount)  just inside the vaginal introitus with a finger-tip every night for two weeks and then Monday, Wednesday and Friday nights.   DIOCTO 150 MG/15ML syrup Generic drug:  Docusate Sodium Take 50 mg by mouth 2 (two) times daily.   estradiol 0.1 MG/GM vaginal cream Commonly known as:  ESTRACE VAGINAL Apply 0.'5mg'$  (pea-sized amount)  just inside the vaginal introitus with a finger-tip every night for two weeks and then Monday, Wednesday and Friday nights.   fluvoxaMINE 25 MG tablet Commonly known as:  LUVOX Take 25 mg by mouth at bedtime.   guaiFENesin 100 MG/5ML Soln Commonly known as:  ROBITUSSIN Take 5 mLs by mouth every 4 (four) hours as needed for cough or to loosen phlegm.   polyethylene glycol packet Commonly known as:  MIRALAX / GLYCOLAX Take 17 g by mouth daily.   QUEtiapine 400 MG tablet Commonly known as:  SEROQUEL Take 800 mg by mouth at bedtime.   risperiDONE 1 MG tablet Commonly known as:  RISPERDAL Take 1 mg by mouth every morning.   risperidone 4 MG tablet Commonly known as:  RISPERDAL Take 4 mg by mouth at bedtime.   Valproate Sodium 250 MG/5ML Soln solution Commonly known as:  DEPAKENE TAKE 10ML ('500MG'$ ) BY MOUTH EVERY MORNING TAKE 7.5ML ('375MG'$ ) BY MOUTH AT BEDTIME   vitamin E 200 UNIT capsule Take 200 Units by mouth daily.       Allergies: No Known Allergies  Family History: Family History  Problem Relation Age of Onset  . Asthma Brother   . Seizures Unknown        paternal cousin    Social History:  reports that  has never smoked. she has never used smokeless tobacco. She reports that  she does not drink alcohol or use drugs.  ROS: UROLOGY Frequent Urination?: Yes Hard to postpone urination?: Yes Burning/pain with urination?: Yes Get up at night to urinate?: Yes Leakage of urine?: Yes Urine stream starts and stops?: No Trouble starting stream?: No Do you have to strain to urinate?: No Blood in urine?: No Urinary tract infection?: Yes Sexually transmitted disease?: No Injury to kidneys or bladder?: No Painful intercourse?: No Weak stream?: No Currently pregnant?: No Vaginal bleeding?: No  Gastrointestinal Nausea?: No Vomiting?: No Indigestion/heartburn?: No Diarrhea?: No Constipation?: Yes  Constitutional Fever: No Night sweats?: No Weight loss?: No Fatigue?: Yes  Skin Skin rash/lesions?: No Itching?: No  Eyes Blurred vision?: No Double vision?: No  Ears/Nose/Throat Sore throat?: No Sinus problems?: No  Hematologic/Lymphatic Swollen glands?: No Easy bruising?: No  Cardiovascular Leg swelling?: No Chest pain?: No  Respiratory Cough?: No Shortness of breath?: No  Endocrine Excessive thirst?: Yes  Musculoskeletal Back pain?: No Joint pain?: No  Neurological Headaches?: No Dizziness?: No  Psychologic Depression?: No Anxiety?: Yes  Physical Exam: BP 94/69   Pulse 92   Ht '5\' 2"'$  (1.575 m)   Wt 138 lb 14.4 oz (63 kg)   BMI 25.41 kg/m   Constitutional: Well nourished. Alert and oriented, No acute distress. HEENT: [place] AT, moist mucus membranes. Trachea midline, no masses. Cardiovascular: No clubbing, cyanosis, or edema. Respiratory: Normal respiratory effort, no increased work of breathing. GI: Abdomen is soft, non tender, non distended, no abdominal masses. Liver and spleen not palpable.  No hernias appreciated.  Stool sample for occult testing is not indicated.   GU: No CVA tenderness.  No bladder fullness or masses.  Atrophic external genitalia, normal pubic hair distribution, no lesions.  Normal urethral meatus, no  lesions, no prolapse, no discharge.   No urethral masses, tenderness and/or tenderness. No bladder fullness, tenderness or masses. Normal vagina mucosa, good estrogen effect, no discharge, no lesions, good pelvic support, no cystocele or rectocele noted.  Cervix, uterus and  adnexal are surgically absent.  Anus and perineum are without rashes or lesions.    Skin: No rashes, bruises or suspicious lesions. Lymph: No cervical or inguinal adenopathy. Neurologic: Grossly intact, no focal deficits, moving all 4 extremities. Psychiatric: Normal mood and affect.  Laboratory Data: Lab Results  Component Value Date   WBC 3.6 06/08/2017   HGB 14.1 06/08/2017   HCT 41.9 06/08/2017   MCV 95 06/08/2017   PLT 88 (LL) 06/08/2017    Lab Results  Component Value Date   CREATININE 0.73 04/28/2017    Lab Results  Component Value Date   AST 14 04/28/2017   Lab Results  Component Value Date   ALT 8 04/28/2017    Urinalysis 11-30 WBC's.  11-30 RBC's.  Moderate bacteria.  See Epic.  CATH UA  I have reviewed the labs.   Assessment & Plan:   1. Dysuria  - criteria for recurrent UTI has been met with 2 or more infections in 6 months or 3 or greater infections in one year - will request records from PCP  - Patient is instructed to increase their water intake until the urine is pale yellow or clear (10 to 12 cups daily)   - probiotics (yogurt, oral pills or vaginal suppositories), take cranberry pills or drink the juice and Vitamin C 1,000 mg daily to acidify the urine should be added to their daily regimen   - avoid soaking in tubs and wipe front to back after urinating   - advised them to have CATH UA's for urinalysis and culture to prevent skin contamination of the specimen  - reviewed symptoms of UTI and advised not to have urine checked or be treated for UTI if not experiencing symptoms  - discussed antibiotic stewardship with the caregiver  - UA is suspicious for infection - will send for  culture   - RTC in one month for OAB questionnaire and PVR and exam  2. Vaginal atrophy  - I explained to the patient that when women go through menopause and her estrogen levels are severely diminished, the normal vaginal flora will change.  This is due to an increase of the vaginal canal's pH. Because of this, the vaginal canal may be colonized by bacteria from the rectum instead of the protective lactobacillus.  This accompanied by the loss of the mucus barrier with vaginal atrophy is a cause of recurrent urinary tract infections.  - In some studies, the use of vaginal estrogen cream has been demonstrated to reduce  recurrent urinary tract infections to one a year.   - Patient was given a sample of vaginal estrogen cream (Premarin) and instructed to apply 0.'5mg'$  (pea-sized amount)  just inside the vaginal introitus with a finger-tip every night for two weeks and then Monday, Wednesday and Friday nights.  I explained to the patient that vaginally administered estrogen, which causes only a slight increase in the blood estrogen levels, have fewer contraindications and adverse systemic effects that oral HT.  - I have also given prescriptions for the Estrace cream and Premarin cream, so that the patient may carry them to the pharmacy to see which one of the branded creams would be most economical for her.  If she finds both medications cost prohibitive, she is instructed to call the office.  We can then call in a compounded vaginal estrogen cream for the patient that may be more affordable.    - She will follow up in three months for an exam.                                         Return in about 1 month (around 08/11/2017) for OAB questionnaire, PVR and exam.  These notes generated with voice recognition software. I apologize for typographical errors.  Zara Council, [facility] [address], [place], [place] [address] [phone]

## 2017-07-12 ENCOUNTER — Ambulatory Visit (INDEPENDENT_AMBULATORY_CARE_PROVIDER_SITE_OTHER): Payer: Medicare Other | Admitting: Urology

## 2017-07-12 ENCOUNTER — Encounter: Payer: Self-pay | Admitting: Urology

## 2017-07-12 VITALS — BP 94/69 | HR 92 | Ht 62.0 in | Wt 138.9 lb

## 2017-07-12 DIAGNOSIS — R300 Dysuria: Secondary | ICD-10-CM

## 2017-07-12 DIAGNOSIS — N952 Postmenopausal atrophic vaginitis: Secondary | ICD-10-CM | POA: Diagnosis not present

## 2017-07-12 LAB — URINALYSIS, COMPLETE
Bilirubin, UA: NEGATIVE
Glucose, UA: NEGATIVE
Ketones, UA: NEGATIVE
NITRITE UA: NEGATIVE
PH UA: 6.5 (ref 5.0–7.5)
Specific Gravity, UA: 1.02 (ref 1.005–1.030)
UUROB: 0.2 mg/dL (ref 0.2–1.0)

## 2017-07-12 LAB — MICROSCOPIC EXAMINATION: Epithelial Cells (non renal): NONE SEEN /hpf (ref 0–10)

## 2017-07-12 MED ORDER — ESTROGENS, CONJUGATED 0.625 MG/GM VA CREA: 1.0000 | TOPICAL_CREAM | Freq: Every day | VAGINAL | 12 refills | Status: DC

## 2017-07-12 MED ORDER — ESTRADIOL 0.1 MG/GM VA CREA: TOPICAL_CREAM | VAGINAL | 12 refills | Status: DC

## 2017-07-12 NOTE — Patient Instructions (Signed)
   I have given you two prescriptions for a vaginal estrogen cream.  Estrace and Premarin.  Please take these to your pharmacy and see which one your insurance covers.  If both are too expensive, please call the office at [phone] for an alternative.  You are given a sample of vaginal estrogen cream (Premarin/Estrace) and instructed to apply 0.5mg (pea-sized amount)  just inside the vaginal introitus with a finger-tip every night for two weeks.    

## 2017-07-16 LAB — CULTURE, URINE COMPREHENSIVE

## 2017-07-20 ENCOUNTER — Telehealth: Payer: Self-pay | Admitting: Urology

## 2017-07-20 NOTE — Telephone Encounter (Signed)
Would you call the [facility] and request the culture results from 01/13/2017, 03/13/2017, 04/25/2017, 06/05/2017 and 06/27/2017?

## 2017-07-31 NOTE — Telephone Encounter (Signed)
01/13/17, 04/10/17, 06/27/17 are the only dates [facility] has ucx results for. Results are being faxed over.

## 2017-08-04 ENCOUNTER — Ambulatory Visit
Admission: RE | Admit: 2017-08-04 | Discharge: 2017-08-04 | Disposition: A | Discharge status: Home / Self Care | Payer: Medicare Other | Source: Ambulatory Visit | Attending: Family Medicine | Admitting: Family Medicine

## 2017-08-04 DIAGNOSIS — Z1231 Encounter for screening mammogram for malignant neoplasm of breast: Secondary | ICD-10-CM | POA: Diagnosis not present

## 2017-08-09 NOTE — Progress Notes (Signed)
08/10/2017 9:07 AM   [name] 01-Jul-1966 [id number]  Referring provider: [name] [address] [place], [place] [address]  Chief Complaint  Patient presents with  . Dysuria    HPI: 51 yo WF with presumptive recurrent UTI's and vaginal atrophy who presents today for a one month follow up.  Background history Patient is a 69 -year-old Caucasian female who is referred to [place] by, Dr. Sherril Cong, for recurrent urinary tract infections.  Patient is a poor historian.  Patient states that she has had 6 urinary tract infections over the last year.  Reviewing her records,  she has had one documented positive urine culture.   Her care giver Her symptoms with a urinary tract infection consist of dysuria, urgency, nocturia, incontinence and frequency.  She denies dysuria, gross hematuria, suprapubic pain, back pain, abdominal pain or flank pain.  She has not had any recent fevers, chills, nausea or vomiting.   She does not have a history of nephrolithiasis, GU surgery or GU trauma.   She is not sexually active.  She is postmenopausal.  She admits to constipation.  She does not engage in good perineal hygiene. She does not take tub baths.  She has incontinence.  She is using incontinence pads, 3 to 4 daily.  She is drinking water daily, but she prefers sodas.  Unsure of the amount of water she drinks.  Is having cranberry juice.   Her CATH UA was positive for 11-30 WBC's, 11-30 RBC's and many bacteria.   PVR is 15 mL.    At her visit on 06/11/2017, her CATH UA was suspicious for infection.  Her urine culture was negative.  She was started on vaginal estrogen cream three nights weekly.  Previous urine culture results were requested from PCP, but they have not yet been received.    Today, she is experiencing urgency x 4-7, frequency x 4-7, not restricting fluids to avoid visits to the restroom, is engaging in toilet mapping, incontinence x 0-3 and nocturia x 0-3.   Her PVR is 19 mL     Her major complaints today are frequency, burning in the external area when urinating and nocturia.   She is wearing 2 depends daily.   She is not having gross hematuria, suprapubic pain or dysuria.  She is not having fevers, chills, nausea or vomiting.      Reviewed referral notes.    Proteus UTI in 04/2017  PMH: Past Medical History:  Diagnosis Date  . Attention deficit disorder   . Hyponatremia    associated with polydipsia  . Mental retardation   . Obesity   . Seizure disorder ([place])   . Seizures [facility])     Surgical History: Past Surgical History:  Procedure Laterality Date  . ABDOMINAL HYSTERECTOMY    . ORIF ANKLE FRACTURE Left 05/08/2015   Procedure: OPEN REDUCTION INTERNAL FIXATION (ORIF) ANKLE FRACTURE;  Surgeon: Corky Mull, MD;  Location: [facility] ORS;  Service: Orthopedics;  Laterality: Left;  Marland Kitchen VAGINAL HYSTERECTOMY      Home Medications:  Allergies as of 08/10/2017   No Known Allergies     Medication List        Accurate as of 08/10/17  9:07 AM. Always use your most recent med list.          acetaminophen 325 MG tablet Commonly known as:  TYLENOL Take 2 tablets (650 mg total) by mouth every 8 (eight) hours as needed for mild pain.   CALCIUM 600 +  D PO Take 600 mg by mouth 2 (two) times daily.   conjugated estrogens vaginal cream Commonly known as:  PREMARIN Place 1 Applicatorful daily vaginally. Apply 0.5mg  (pea-sized amount)  just inside the vaginal introitus with a finger-tip every night for two weeks and then Monday, Wednesday and Friday nights.   DIOCTO 150 MG/15ML syrup Generic drug:  Docusate Sodium Take 50 mg by mouth 2 (two) times daily.   estradiol 0.1 MG/GM vaginal cream Commonly known as:  ESTRACE VAGINAL Apply 0.5mg  (pea-sized amount)  just inside the vaginal introitus with a finger-tip every night for two weeks and then Monday, Wednesday and Friday nights.   fluvoxaMINE 25 MG tablet Commonly known as:  LUVOX Take 25 mg by mouth at  bedtime.   guaiFENesin 100 MG/5ML Soln Commonly known as:  ROBITUSSIN Take 5 mLs by mouth every 4 (four) hours as needed for cough or to loosen phlegm.   oxybutynin 5 MG/5ML syrup Commonly known as:  DITROPAN Take 5 mLs (5 mg total) by mouth 3 (three) times daily.   polyethylene glycol packet Commonly known as:  MIRALAX / GLYCOLAX Take 17 g by mouth daily.   QUEtiapine 400 MG tablet Commonly known as:  SEROQUEL Take 800 mg by mouth at bedtime.   risperiDONE 1 MG tablet Commonly known as:  RISPERDAL Take 1 mg by mouth every morning.   risperidone 4 MG tablet Commonly known as:  RISPERDAL Take 4 mg by mouth at bedtime.   Valproate Sodium 250 MG/5ML Soln solution Commonly known as:  DEPAKENE TAKE 10ML (500MG ) BY MOUTH EVERY MORNING TAKE 7.5ML (375MG ) BY MOUTH AT BEDTIME   vitamin E 200 UNIT capsule Take 200 Units by mouth daily.       Allergies: No Known Allergies  Family History: Family History  Problem Relation Age of Onset  . Asthma Brother   . Seizures Unknown        paternal cousin    Social History:  reports that  has never smoked. she has never used smokeless tobacco. She reports that she does not drink alcohol or use drugs.  ROS: UROLOGY Frequent Urination?: Yes Hard to postpone urination?: No Burning/pain with urination?: Yes Get up at night to urinate?: Yes Leakage of urine?: No Urine stream starts and stops?: No Trouble starting stream?: No Do you have to strain to urinate?: No Blood in urine?: No Urinary tract infection?: No Sexually transmitted disease?: No Injury to kidneys or bladder?: No Painful intercourse?: No Weak stream?: No Currently pregnant?: No Vaginal bleeding?: No Last menstrual period?: n  Gastrointestinal Nausea?: No Vomiting?: No Indigestion/heartburn?: No Diarrhea?: No Constipation?: No  Constitutional Fever: No Night sweats?: No Weight loss?: No Fatigue?: No  Skin Skin rash/lesions?: No Itching?:  No  Eyes Blurred vision?: No Double vision?: No  Ears/Nose/Throat Sore throat?: No Sinus problems?: No  Hematologic/Lymphatic Swollen glands?: No Easy bruising?: No  Cardiovascular Leg swelling?: No Chest pain?: No  Respiratory Cough?: No Shortness of breath?: No  Endocrine Excessive thirst?: No  Musculoskeletal Back pain?: No Joint pain?: No  Neurological Headaches?: No Dizziness?: No  Psychologic Depression?: No Anxiety?: No  Physical Exam: BP 109/74   Pulse 68   Ht 5\' 2"  (1.575 m)   Wt 144 lb 11.2 oz (65.6 kg)   BMI 26.47 kg/m   Constitutional: Well nourished. Alert and oriented, No acute distress. HEENT: [place] AT, moist mucus membranes. Trachea midline, no masses. Cardiovascular: No clubbing, cyanosis, or edema. Respiratory: Normal respiratory effort, no increased work of  breathing. Skin: No rashes, bruises or suspicious lesions. Lymph: No cervical or inguinal adenopathy. Neurologic: Grossly intact, no focal deficits, moving all 4 extremities. Psychiatric: Normal mood and affect.  Laboratory Data: Lab Results  Component Value Date   WBC 3.6 06/08/2017   HGB 14.1 06/08/2017   HCT 41.9 06/08/2017   MCV 95 06/08/2017   PLT 88 (LL) 06/08/2017    Lab Results  Component Value Date   CREATININE 0.73 04/28/2017    Lab Results  Component Value Date   AST 14 04/28/2017   Lab Results  Component Value Date   ALT 8 04/28/2017    Pertinent Imaging Results for REMY, DIA (MRN [id number]) as of 08/10/2017 09:00  Ref. Range 08/10/2017 08:49  Scan Result Unknown 13 ml    Assessment & Plan:   1. Dysuria  - patient is having external burning  - continue the vaginal estrogen cream three nights weekly   2. Vaginal atrophy  - continue vaginal estrogen cream three nights weekly  - She will follow up in three months for an exam.    3. Incontinence  - start oxybutynin 5 mL tid   - RTC in 6 weeks for OAB questionnaire and PVR                                       Return in about 6 weeks (around 09/21/2017) for PVR and OAB questionnaire.  These notes generated with voice recognition software. I apologize for typographical errors.  Zara Council, [facility] [address] [place], [place] [address] [phone]

## 2017-08-10 ENCOUNTER — Encounter: Payer: Self-pay | Admitting: Urology

## 2017-08-10 ENCOUNTER — Ambulatory Visit (INDEPENDENT_AMBULATORY_CARE_PROVIDER_SITE_OTHER): Payer: Medicare Other | Admitting: Urology

## 2017-08-10 VITALS — BP 109/74 | HR 68 | Ht 62.0 in | Wt 144.7 lb

## 2017-08-10 DIAGNOSIS — N3946 Mixed incontinence: Secondary | ICD-10-CM

## 2017-08-10 DIAGNOSIS — N952 Postmenopausal atrophic vaginitis: Secondary | ICD-10-CM | POA: Diagnosis not present

## 2017-08-10 DIAGNOSIS — R300 Dysuria: Secondary | ICD-10-CM | POA: Diagnosis not present

## 2017-08-10 LAB — BLADDER SCAN AMB NON-IMAGING

## 2017-08-10 MED ORDER — MIRABEGRON ER 25 MG PO TB24
25.0000 mg | ORAL_TABLET | Freq: Every day | ORAL | 12 refills | Status: DC
Start: 2017-08-10 — End: 2017-08-10

## 2017-08-10 MED ORDER — OXYBUTYNIN CHLORIDE 5 MG/5ML PO SYRP: 5.0000 mg | ORAL_SOLUTION | Freq: Three times a day (TID) | ORAL | 3 refills | Status: DC

## 2017-09-18 NOTE — Progress Notes (Signed)
09/19/2017 9:30 AM   Roberta Hampton 10/01/1965 [id number]  Referring provider: [name] [address] [place], [place] [address]  No chief complaint on file.   HPI: 52 yo WF with presumptive recurrent UTI's, incontinence and vaginal atrophy who presents today for a 6 week follow up after starting ditropan 5 mg tid.    Background history Patient is a 30 -year-old Caucasian female who is referred to [place] by, Dr. Sherril Cong, for recurrent urinary tract infections.  Patient is a poor historian.  Patient states that she has had 6 urinary tract infections over the last year.  Reviewing her records,  she has had one documented positive urine culture.   Her care giver states her symptoms with a urinary tract infection consist of dysuria, urgency, nocturia, incontinence and frequency.  She denies dysuria, gross hematuria, suprapubic pain, back pain, abdominal pain or flank pain with UTI's.  She has not had any fevers, chills, nausea or vomiting with UTI's.  She does not have a history of nephrolithiasis, GU surgery or GU trauma.   She is not sexually active.  She is postmenopausal.  She admits to constipation.  She does not engage in good perineal hygiene. She does not take tub baths.  She has incontinence.  She is using incontinence pads, 3 to 4 daily.  She is drinking water daily, but she prefers sodas.  Unsure of the amount of water she drinks.  Is having cranberry juice.   Her CATH UA was positive for 11-30 WBC's, 11-30 RBC's and many bacteria.   PVR is 15 mL.    At her visit on 06/11/2017, her CATH UA was suspicious for infection.  Her urine culture was negative.  She was started on vaginal estrogen cream three nights weekly and oxybutynin 5 mg tid.  Previous urine culture results were requested from PCP, but they have not yet been received.    Today, she is experiencing frequency and straining to urinate.  Her PVR is 18 mL    Her major complaints today are frequency, burning in  the external area when urinating and nocturia.   She is wearing 2 depends daily.   She is not having gross hematuria, suprapubic pain or dysuria.  She is not having fevers, chills, nausea or vomiting.   Staff has noted a decrease in her incontinence.  She still complains of urgency.  Staff states she is requesting to use the restroom frequently.  They do notice that she will sit and rock on the toilet and no urine is seen in the toilet after she gets up.    Reviewed referral notes.    Proteus UTI in 04/2017  PMH: Past Medical History:  Diagnosis Date  . Attention deficit disorder   . Hyponatremia    associated with polydipsia  . Mental retardation   . Obesity   . Seizure disorder ([place])   . Seizures [facility])     Surgical History: Past Surgical History:  Procedure Laterality Date  . ABDOMINAL HYSTERECTOMY    . ORIF ANKLE FRACTURE Left 05/08/2015   Procedure: OPEN REDUCTION INTERNAL FIXATION (ORIF) ANKLE FRACTURE;  Surgeon: Corky Mull, MD;  Location: [facility] ORS;  Service: Orthopedics;  Laterality: Left;  Marland Kitchen VAGINAL HYSTERECTOMY      Home Medications:  Allergies as of 09/19/2017   No Known Allergies     Medication List        Accurate as of 09/19/17  9:30 AM. Always use your most recent med list.  acetaminophen 325 MG tablet Commonly known as:  TYLENOL Take 2 tablets (650 mg total) by mouth every 8 (eight) hours as needed for mild pain.   CALCIUM 600 + D PO Take 600 mg by mouth 2 (two) times daily.   DIOCTO 150 MG/15ML syrup Generic drug:  Docusate Sodium Take 50 mg by mouth 2 (two) times daily.   estradiol 0.1 MG/GM vaginal cream Commonly known as:  ESTRACE VAGINAL Apply 0.5mg  (pea-sized amount)  just inside the vaginal introitus with a finger-tip every night for two weeks and then Monday, Wednesday and Friday nights.   fluvoxaMINE 25 MG tablet Commonly known as:  LUVOX Take 25 mg by mouth at bedtime.   guaiFENesin 100 MG/5ML Soln Commonly known as:   ROBITUSSIN Take 5 mLs by mouth every 4 (four) hours as needed for cough or to loosen phlegm.   oxybutynin 5 MG/5ML syrup Commonly known as:  DITROPAN Take 5 mLs (5 mg total) by mouth 3 (three) times daily.   polyethylene glycol packet Commonly known as:  MIRALAX / GLYCOLAX Take 17 g by mouth daily.   QUEtiapine 400 MG tablet Commonly known as:  SEROQUEL Take 800 mg by mouth at bedtime.   risperiDONE 1 MG tablet Commonly known as:  RISPERDAL Take 1 mg by mouth every morning.   risperidone 4 MG tablet Commonly known as:  RISPERDAL Take 4 mg by mouth at bedtime.   Valproate Sodium 250 MG/5ML Soln solution Commonly known as:  DEPAKENE TAKE 10ML (500MG ) BY MOUTH EVERY MORNING TAKE 7.5ML (375MG ) BY MOUTH AT BEDTIME   vitamin E 200 UNIT capsule Take 200 Units by mouth daily.       Allergies: No Known Allergies  Family History: Family History  Problem Relation Age of Onset  . Asthma Brother   . Seizures Unknown        paternal cousin    Social History:  reports that  has never smoked. she has never used smokeless tobacco. She reports that she does not drink alcohol or use drugs.  ROS: UROLOGY Frequent Urination?: Yes Hard to postpone urination?: No Burning/pain with urination?: No Get up at night to urinate?: No Leakage of urine?: No Urine stream starts and stops?: No Trouble starting stream?: No Do you have to strain to urinate?: Yes Blood in urine?: No Urinary tract infection?: No Sexually transmitted disease?: No Injury to kidneys or bladder?: No Painful intercourse?: No Weak stream?: No Currently pregnant?: No Vaginal bleeding?: No Last menstrual period?: n  Gastrointestinal Nausea?: No Vomiting?: No Indigestion/heartburn?: No Diarrhea?: No Constipation?: No  Constitutional Fever: No Night sweats?: No Weight loss?: No Fatigue?: No  Skin Skin rash/lesions?: No Itching?: No  Eyes Blurred vision?: No Double vision?:  No  Ears/Nose/Throat Sore throat?: No Sinus problems?: No  Hematologic/Lymphatic Swollen glands?: No Easy bruising?: No  Cardiovascular Leg swelling?: No Chest pain?: No  Respiratory Cough?: No Shortness of breath?: No  Endocrine Excessive thirst?: No  Musculoskeletal Back pain?: No Joint pain?: No  Neurological Headaches?: No Dizziness?: No  Psychologic Depression?: No Anxiety?: No  Physical Exam: BP 90/64 (BP Location: Left Arm, Patient Position: Sitting, Cuff Size: Normal)   Pulse 94   Ht 5\' 2"  (1.575 m)   BMI 26.47 kg/m Constitutional: Well nourished. Alert and oriented, No acute distress. HEENT: [place] AT, moist mucus membranes. Trachea midline, no masses. Cardiovascular: No clubbing, cyanosis, or edema. Respiratory: Normal respiratory effort, no increased work of breathing. GI: Abdomen is soft, non tender, non distended, no abdominal  masses. Liver and spleen not palpable.  No hernias appreciated.  Stool sample for occult testing is not indicated.   GU: No CVA tenderness.  No bladder fullness or masses.   Skin: No rashes, bruises or suspicious lesions. Lymph: No cervical or inguinal adenopathy. Neurologic: Grossly intact, no focal deficits, moving all 4 extremities. Psychiatric: Normal mood and affect.   Laboratory Data: Lab Results  Component Value Date   WBC 3.6 06/08/2017   HGB 14.1 06/08/2017   HCT 41.9 06/08/2017   MCV 95 06/08/2017   PLT 88 (LL) 06/08/2017    Lab Results  Component Value Date   CREATININE 0.73 04/28/2017    Lab Results  Component Value Date   AST 14 04/28/2017   Lab Results  Component Value Date   ALT 8 04/28/2017   I have reviewed the labs.  Pertinent Imaging Results for BAILIE, [place] (MRN [id number]) as of 09/19/2017 09:33  Ref. Range 08/10/2017 08:49  Scan Result Unknown 13 ml     Assessment & Plan:   1. Urgency  - patient has been taking the Myrbetriq 25 mg and oxybutynin 5 mg tid and staff states she  is still requesting to use the restroom and not having any output   - ? Of needing to void or having constipation   - due to patient's MR, she would not tolerate UDS  - have the patient to continue the Myrbetriq and oxybutynin  2. Vaginal atrophy  - continue vaginal estrogen cream three nights weekly  - She will follow up in three months for an exam.    3. Incontinence  - improved with the addition of oxybutynin 5 mL tid - continue the medication along with the Myrbetriq  4. History of recurrent UTI's  - patient has just finished an antibiotic for an UTI  - advised patient's caregiver to have Roberta Hampton cathed for symptoms of an UTI  - schedule RUS to rule out nidus for infections   - RTC for RUS report                                      Return in about 1 month (around 10/20/2017) for RUS report.  These notes generated with voice recognition software. I apologize for typographical errors.  Zara Council, [facility] [address] [place], [place] [address] [phone]

## 2017-09-19 ENCOUNTER — Ambulatory Visit (INDEPENDENT_AMBULATORY_CARE_PROVIDER_SITE_OTHER): Payer: Medicare Other | Admitting: Urology

## 2017-09-19 ENCOUNTER — Encounter: Payer: Self-pay | Admitting: Urology

## 2017-09-19 VITALS — BP 90/64 | HR 94 | Ht 62.0 in

## 2017-09-19 DIAGNOSIS — Z87440 Personal history of urinary (tract) infections: Secondary | ICD-10-CM | POA: Diagnosis not present

## 2017-09-19 DIAGNOSIS — N3946 Mixed incontinence: Secondary | ICD-10-CM | POA: Diagnosis not present

## 2017-09-19 DIAGNOSIS — N952 Postmenopausal atrophic vaginitis: Secondary | ICD-10-CM

## 2017-09-19 DIAGNOSIS — R3129 Other microscopic hematuria: Secondary | ICD-10-CM

## 2017-10-02 ENCOUNTER — Encounter: Payer: Self-pay | Admitting: Podiatry

## 2017-10-02 ENCOUNTER — Ambulatory Visit (INDEPENDENT_AMBULATORY_CARE_PROVIDER_SITE_OTHER): Payer: Medicare Other | Admitting: Podiatry

## 2017-10-02 DIAGNOSIS — B351 Tinea unguium: Secondary | ICD-10-CM

## 2017-10-02 DIAGNOSIS — R6250 Unspecified lack of expected normal physiological development in childhood: Secondary | ICD-10-CM | POA: Insufficient documentation

## 2017-10-02 DIAGNOSIS — M79676 Pain in unspecified toe(s): Secondary | ICD-10-CM

## 2017-10-02 NOTE — Progress Notes (Signed)
Complaint:  Visit Type: Patient returns to my office for continued preventative foot care services. Complaint: Patient states" my nails have grown long and thick and become painful to walk and wear shoes"  The patient presents for preventative foot care services.   Podiatric Exam: Vascular: dorsalis pedis and posterior tibial pulses are palpable bilateral. Capillary return is immediate. Temperature gradient is WNL. Skin turgor WNL  Sensorium: Normal Semmes Weinstein monofilament test. Normal tactile sensation bilaterally. Nail Exam: Pt has thick disfigured discolored nails with subungual debris noted bilateral entire nail hallux through fifth toenails Ulcer Exam: There is no evidence of ulcer or pre-ulcerative changes or infection. Orthopedic Exam: Muscle tone and strength are WNL. No limitations in general ROM. No crepitus or effusions noted. Foot type and digits show no abnormalities. Bony prominences are unremarkable. Skin: No Porokeratosis. No infection or ulcers  Diagnosis:  Onychomycosis, , Pain in right toe, pain in left toes  Treatment & Plan Procedures and Treatment: Consent by patient was obtained for treatment procedures. The patient understood the discussion of treatment and procedures well. All questions were answered thoroughly reviewed. Debridement of mycotic and hypertrophic toenails, 1 through 5 bilateral and clearing of subungual debris. No ulceration, no infection noted.  No grinding was performed . ABN signed for 2019. Return Visit-Office Procedure: Patient instructed to return to the office for a follow up visit 3 months for continued evaluation and treatment.    Gardiner Barefoot DPM

## 2017-10-04 ENCOUNTER — Ambulatory Visit
Admission: RE | Admit: 2017-10-04 | Discharge: 2017-10-04 | Disposition: A | Discharge status: Home / Self Care | Payer: Medicare Other | Source: Ambulatory Visit | Attending: Urology | Admitting: Urology

## 2017-10-04 DIAGNOSIS — N2889 Other specified disorders of kidney and ureter: Secondary | ICD-10-CM | POA: Diagnosis not present

## 2017-10-04 DIAGNOSIS — R3129 Other microscopic hematuria: Secondary | ICD-10-CM | POA: Insufficient documentation

## 2017-10-19 ENCOUNTER — Ambulatory Visit: Payer: Medicare Other | Admitting: Urology

## 2017-10-31 ENCOUNTER — Encounter: Payer: Self-pay | Admitting: Urology

## 2017-10-31 ENCOUNTER — Ambulatory Visit (INDEPENDENT_AMBULATORY_CARE_PROVIDER_SITE_OTHER): Payer: Medicare Other | Admitting: Urology

## 2017-10-31 VITALS — BP 93/62 | HR 81 | Ht 62.0 in | Wt 142.6 lb

## 2017-10-31 DIAGNOSIS — N2889 Other specified disorders of kidney and ureter: Secondary | ICD-10-CM

## 2017-10-31 NOTE — Progress Notes (Signed)
10/31/2017 9:22 AM   Roberta Hampton 20-May-1966 [id number]  Referring provider: [name] [address] [place], [place] [address]  Chief Complaint  Patient presents with  . Follow-up    RUS results    HPI: 52 yo WF with MR, presumptive recurrent UTI's, incontinence and vaginal atrophy who presents today to review RUS results.  Background history Patient is a 57 -year-old Caucasian female who is referred to [place] by, Dr. Sherril Cong, for recurrent urinary tract infections.  Patient is unable to give history.  Caregiver states that she has had 6 urinary tract infections over the last year.  Reviewing her records,  she has had one documented positive urine culture.   Her care giver states her symptoms with a urinary tract infection consist of dysuria, urgency, nocturia, incontinence and frequency.  Caregiver denies dysuria, gross hematuria, suprapubic pain, back pain, abdominal pain or flank pain with UTI's.  Caregiver has not noted any fevers, chills, nausea or vomiting with UTI's.  She does not have a history of nephrolithiasis, GU surgery or GU trauma.   She is not sexually active.  She is postmenopausal.  Caregiver admits to constipation.  She does not engage in good perineal hygiene. She does not take tub baths.  She has incontinence.  She is using incontinence pads, 3 to 4 daily.  She is drinking water daily, but she prefers sodas.  Unsure of the amount of water she drinks.  Is having cranberry juice.   Her CATH UA was positive for 11-30 WBC's, 11-30 RBC's and many bacteria.   PVR is 15 mL.    At her visit on 06/11/2017, her CATH UA was suspicious for infection.  Her urine culture was negative.  She was started on vaginal estrogen cream three nights weekly and oxybutynin 5 mg tid.  Previous urine culture results were requested from PCP, but they have not yet been received.    On 09/19/2017, she was experiencing frequency and straining to urinate.  Her PVR was 18 mL    Her  major complaints were frequency, burning in the external area when urinating and nocturia.   She was wearing 2 depends daily.   Staff has noted a decrease in her incontinence.  She still complains of urgency.  Staff states she is requesting to use the restroom frequently.  They do notice that she will sit and rock on the toilet and no urine is seen in the toilet after she gets up.  Today, the caregiver states that she still has frequency and urgency.  She is taking the oxybutynin and Myrbetriq.  They are also applying the vaginal estrogen for her.  Patient denies any gross hematuria, dysuria or suprapubic/flank pain.  Patient denies any fevers, chills, nausea or vomiting.   RUS performed on 10/04/2017 noted BILATERAL pelviectasis and minimal calyceal dilatation.  Due to limitations of exam recommend follow-up noncontrast CT for further evaluation.   PMH: Past Medical History:  Diagnosis Date  . Attention deficit disorder   . Hyponatremia    associated with polydipsia  . Mental retardation   . Obesity   . Seizure disorder ([place])   . Seizures [facility])     Surgical History: Past Surgical History:  Procedure Laterality Date  . ABDOMINAL HYSTERECTOMY    . ORIF ANKLE FRACTURE Left 05/08/2015   Procedure: OPEN REDUCTION INTERNAL FIXATION (ORIF) ANKLE FRACTURE;  Surgeon: Corky Mull, MD;  Location: [facility] ORS;  Service: Orthopedics;  Laterality: Left;  Marland Kitchen VAGINAL HYSTERECTOMY  Home Medications:  Allergies as of 10/31/2017   No Known Allergies     Medication List        Accurate as of 10/31/17  9:22 AM. Always use your most recent med list.          acetaminophen 325 MG tablet Commonly known as:  TYLENOL Take 2 tablets (650 mg total) by mouth every 8 (eight) hours as needed for mild pain.   CALCIUM 600 + D PO Take 600 mg by mouth 2 (two) times daily.   DIOCTO 150 MG/15ML syrup Generic drug:  Docusate Sodium Take 50 mg by mouth 2 (two) times daily.   estradiol 0.1 MG/GM vaginal  cream Commonly known as:  ESTRACE VAGINAL Apply 0.5mg  (pea-sized amount)  just inside the vaginal introitus with a finger-tip every night for two weeks and then Monday, Wednesday and Friday nights.   fluvoxaMINE 25 MG tablet Commonly known as:  LUVOX Take 25 mg by mouth at bedtime.   guaiFENesin 100 MG/5ML Soln Commonly known as:  ROBITUSSIN Take 5 mLs by mouth every 4 (four) hours as needed for cough or to loosen phlegm.   MYRBETRIQ 25 MG Tb24 tablet Generic drug:  mirabegron ER Take 25 mg by mouth daily.   oxybutynin 5 MG/5ML syrup Commonly known as:  DITROPAN Take 5 mLs (5 mg total) by mouth 3 (three) times daily.   polyethylene glycol packet Commonly known as:  MIRALAX / GLYCOLAX Take 17 g by mouth daily.   QUEtiapine 400 MG tablet Commonly known as:  SEROQUEL Take 800 mg by mouth at bedtime.   risperiDONE 1 MG tablet Commonly known as:  RISPERDAL Take 1 mg by mouth every morning.   risperidone 4 MG tablet Commonly known as:  RISPERDAL Take 4 mg by mouth at bedtime.   Valproate Sodium 250 MG/5ML Soln solution Commonly known as:  DEPAKENE TAKE 10ML (500MG ) BY MOUTH EVERY MORNING TAKE 7.5ML (375MG ) BY MOUTH AT BEDTIME   vitamin E 200 UNIT capsule Take 200 Units by mouth daily.       Allergies: No Known Allergies  Family History: Family History  Problem Relation Age of Onset  . Asthma Brother   . Seizures Unknown        paternal cousin    Social History:  reports that  has never smoked. she has never used smokeless tobacco. She reports that she does not drink alcohol or use drugs.  ROS: UROLOGY Frequent Urination?: Yes Hard to postpone urination?: Yes Burning/pain with urination?: No Get up at night to urinate?: No Leakage of urine?: No Urine stream starts and stops?: No Trouble starting stream?: No Do you have to strain to urinate?: No Blood in urine?: No Urinary tract infection?: No Sexually transmitted disease?: No Injury to kidneys or  bladder?: No Painful intercourse?: No Weak stream?: No Currently pregnant?: No Vaginal bleeding?: No Last menstrual period?: n  Gastrointestinal Nausea?: No Vomiting?: No Indigestion/heartburn?: No Diarrhea?: No Constipation?: No  Constitutional Fever: No Night sweats?: No Weight loss?: No Fatigue?: No  Skin Skin rash/lesions?: No Itching?: No  Eyes Blurred vision?: No Double vision?: No  Ears/Nose/Throat Sore throat?: No Sinus problems?: No  Hematologic/Lymphatic Swollen glands?: No Easy bruising?: No  Cardiovascular Leg swelling?: No Chest pain?: No  Respiratory Cough?: No Shortness of breath?: No  Endocrine Excessive thirst?: No  Musculoskeletal Back pain?: No Joint pain?: No  Neurological Headaches?: No Dizziness?: No  Psychologic Depression?: No Anxiety?: No  Physical Exam: BP 93/62 (BP Location: Right Arm,  Patient Position: Sitting, Cuff Size: Large)   Pulse 81   Ht 5\' 2"  (1.575 m)   Wt 142 lb 9.6 oz (64.7 kg)   BMI 26.08 kg/m   Constitutional: Well nourished. Alert and oriented, No acute distress. HEENT: [place] AT, moist mucus membranes. Trachea midline, no masses. Cardiovascular: No clubbing, cyanosis, or edema. Respiratory: Normal respiratory effort, no increased work of breathing. Skin: No rashes, bruises or suspicious lesions. Lymph: No cervical or inguinal adenopathy. Neurologic: Grossly intact, no focal deficits, moving all 4 extremities. Psychiatric: Normal mood and affect.   Laboratory Data: Lab Results  Component Value Date   WBC 3.6 06/08/2017   HGB 14.1 06/08/2017   HCT 41.9 06/08/2017   MCV 95 06/08/2017   PLT 88 (LL) 06/08/2017    Lab Results  Component Value Date   CREATININE 0.73 04/28/2017    Lab Results  Component Value Date   AST 14 04/28/2017   Lab Results  Component Value Date   ALT 8 04/28/2017   I have reviewed the labs.  Pertinent Imaging CLINICAL DATA:  Microscopic hematuria, recurrent  UTIs  EXAM: RENAL / URINARY TRACT ULTRASOUND COMPLETE  COMPARISON:  None  FINDINGS: Right Kidney:  Length: 11.2 cm. Cortical thinning. Normal cortical echogenicity. No mass or shadowing calcification. Dilated RIGHT renal pelvis. Question minimal calyceal dilatation.  Left Kidney:  Length: Cortical thinning. Normal cortical echogenicity. Dilated LEFT renal pelvis with minimal calyceal dilatation. No gross evidence of mass. No shadowing calcification.  Bladder:  Appears normal for degree of bladder distention. RIGHT ureteral jet was visualized but a LEFT ureteral jet was not seen.  Examination limited by patient's inability to cooperate.  IMPRESSION: BILATERAL pelviectasis and minimal calyceal dilatation.  Due to limitations of exam recommend follow-up noncontrast CT for further evaluation.   Electronically Signed   By: Lavonia Dana M.D.   On: 10/04/2017 18:24  I have independently reviewed the films.    Assessment & Plan:   1. Calyceal dilatation  - as patient cannot give history and no left ureteral jet is seen - will obtain a non-contrast CT for further evaluation  - Advised caregivers to contact our office or seek treatment in the ED if becomes febrile or pain/ vomiting are difficult control in order to arrange for emergent/urgent intervention  - RTC for CT report                                    Return for CT report .  These notes generated with voice recognition software. I apologize for typographical errors.  Zara Council, PA-C  [place] [address] [place], [place] [address] [phone]  I spent 25 minutes in a face to face conversation with the patient's caregiver regarding RUS findings and implications.  Greater than 50% was spent in counseling & coordination of care with the patient.

## 2017-11-13 ENCOUNTER — Other Ambulatory Visit: Payer: Self-pay

## 2017-11-13 NOTE — Progress Notes (Signed)
Entered in error

## 2017-11-20 ENCOUNTER — Ambulatory Visit
Admission: RE | Admit: 2017-11-20 | Discharge: 2017-11-20 | Disposition: A | Discharge status: Home / Self Care | Payer: Medicare Other | Source: Ambulatory Visit | Attending: Urology | Admitting: Urology

## 2017-11-20 DIAGNOSIS — K5900 Constipation, unspecified: Secondary | ICD-10-CM | POA: Insufficient documentation

## 2017-11-20 DIAGNOSIS — J90 Pleural effusion, not elsewhere classified: Secondary | ICD-10-CM | POA: Diagnosis not present

## 2017-11-20 DIAGNOSIS — M899 Disorder of bone, unspecified: Secondary | ICD-10-CM | POA: Insufficient documentation

## 2017-11-20 DIAGNOSIS — J9811 Atelectasis: Secondary | ICD-10-CM | POA: Insufficient documentation

## 2017-11-20 DIAGNOSIS — N2889 Other specified disorders of kidney and ureter: Secondary | ICD-10-CM

## 2017-11-20 DIAGNOSIS — I700 Atherosclerosis of aorta: Secondary | ICD-10-CM | POA: Insufficient documentation

## 2017-11-20 DIAGNOSIS — N329 Bladder disorder, unspecified: Secondary | ICD-10-CM | POA: Diagnosis not present

## 2017-11-28 NOTE — Progress Notes (Signed)
11/29/2017 3:33 PM   [name] 25-Aug-1966 [id number]  Referring provider: Center, [facility] [place], [place] [address]  Chief Complaint  Patient presents with  . Urinary Incontinence    HPI: 52 yo WF with MR, presumptive recurrent UTI's, incontinence and vaginal atrophy who presents today to review CT results.    Background history Patient is a 86 -year-old Caucasian female who is referred to [place] by, Dr. Sherril Cong, for recurrent urinary tract infections.  Patient is unable to give history.  Caregiver states that she has had 6 urinary tract infections over the last year.  Reviewing her records,  she has had one documented positive urine culture.   Her care giver states her symptoms with a urinary tract infection consist of dysuria, urgency, nocturia, incontinence and frequency.  Caregiver denies dysuria, gross hematuria, suprapubic pain, back pain, abdominal pain or flank pain with UTI's.  Caregiver has not noted any fevers, chills, nausea or vomiting with UTI's.  She does not have a history of nephrolithiasis, GU surgery or GU trauma.   She is not sexually active.  She is postmenopausal.  Caregiver admits to constipation.  She does not engage in good perineal hygiene. She does not take tub baths.  She has incontinence.  She is using incontinence pads, 3 to 4 daily.  She is drinking water daily, but she prefers sodas.  Unsure of the amount of water she drinks.  Is having cranberry juice.   Her CATH UA was positive for 11-30 WBC's, 11-30 RBC's and many bacteria.   PVR is 15 mL.    At her visit on 06/11/2017, her CATH UA was suspicious for infection.  Her urine culture was negative.  She was started on vaginal estrogen cream three nights weekly and oxybutynin 5 mg tid.  Previous urine culture results were requested from PCP, but they have not yet been received.    On 09/19/2017, she was experiencing frequency and straining to urinate.  Her PVR was 18 mL    Her  major complaints were frequency, burning in the external area when urinating and nocturia.   She was wearing 2 depends daily.   Staff has noted a decrease in her incontinence.  She still complains of urgency.  Staff states she is requesting to use the restroom frequently.  They do notice that she will sit and rock on the toilet and no urine is seen in the toilet after she gets up.  On 10/31/2017, the caregiver states that she still has frequency and urgency.  She is taking the oxybutynin and Myrbetriq.  They are also applying the vaginal estrogen for her.  Patient denies any gross hematuria, dysuria or suprapubic/flank pain.  Patient denies any fevers, chills, nausea or vomiting.  RUS performed on 10/04/2017 noted BILATERAL pelviectasis and minimal calyceal dilatation.  Due to limitations of exam recommend follow-up noncontrast CT for further evaluation.  Non contrast CT performed on 11/20/2017 noted a bilateral extrarenal pelvis which may partially explain the fullness of the collecting systems. There is no current calyceal dilatation or ureteral dilatation, and no urinary tract calculi are identified. 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  Small bilateral pleural effusions with associated passive atelectasis.  Calcification of the aortic valve. Aortic Atherosclerosis  Prominent stool throughout constipation with notable prominence of stool in the rectal vault.  Correlate with bowel habits in assessing for mild fecal impaction.  Small fluid density lesion just above the left side of the urinary bladder, probably a small bladder diverticulum or similar incidental finding.  There is some bony heterogeneity in the right iliac bone, most likely benign.  Today (11/29/2017), caregiver states she is having frequency of urination and an odor.  They have not observed  any gross hematuria, dysuria or suprapubic/flank pain.  They have not observed any fevers, chills, nausea or vomiting.     PMH: Past Medical History:  Diagnosis Date  . Attention deficit disorder   . Hyponatremia    associated with polydipsia  . Mental retardation   . Obesity   . Seizure disorder ([place])   . Seizures [facility])     Surgical History: Past Surgical History:  Procedure Laterality Date  . ABDOMINAL HYSTERECTOMY    . ORIF ANKLE FRACTURE Left 05/08/2015   Procedure: OPEN REDUCTION INTERNAL FIXATION (ORIF) ANKLE FRACTURE;  Surgeon: Corky Mull, MD;  Location: [facility] ORS;  Service: Orthopedics;  Laterality: Left;  Marland Kitchen VAGINAL HYSTERECTOMY      Home Medications:  Allergies as of 11/29/2017   No Known Allergies     Medication List        Accurate as of 11/29/17  3:33 PM. Always use your most recent med list.          CALCIUM 600 + D PO Take 600 mg by mouth 2 (two) times daily.   DIOCTO 150 MG/15ML syrup Generic drug:  Docusate Sodium Take 50 mg by mouth 2 (two) times daily.   estradiol 0.1 MG/GM vaginal cream Commonly known as:  ESTRACE VAGINAL Apply 0.5mg  (pea-sized amount)  just inside the vaginal introitus with a finger-tip every night for two weeks and then Monday, Wednesday and Friday nights.   fluvoxaMINE 25 MG tablet Commonly known as:  LUVOX Take 25 mg by mouth at bedtime.   guaiFENesin 100 MG/5ML Soln Commonly known as:  ROBITUSSIN Take 5 mLs by mouth every 4 (four) hours as needed for cough or to loosen phlegm.   MYRBETRIQ 25 MG Tb24 tablet Generic drug:  mirabegron ER Take 25 mg by mouth daily.   oxybutynin 5 MG/5ML syrup Commonly known as:  DITROPAN Take 5 mLs (5 mg total) by mouth 3 (three) times daily.   polyethylene glycol packet Commonly known as:  MIRALAX / GLYCOLAX Take 17 g by mouth daily.   QUEtiapine 400 MG tablet Commonly known as:  SEROQUEL Take 800 mg by mouth at bedtime.   risperiDONE 1 MG tablet Commonly known as:   RISPERDAL Take 1 mg by mouth every morning.   risperidone 4 MG tablet Commonly known as:  RISPERDAL Take 4 mg by mouth at bedtime.   Valproate Sodium 250 MG/5ML Soln solution Commonly known as:  DEPAKENE TAKE 10ML (500MG ) BY MOUTH EVERY MORNING TAKE 7.5ML (375MG ) BY MOUTH AT BEDTIME   vitamin E 200 UNIT capsule Take 200 Units by mouth daily.       Allergies: No Known Allergies  Family History: Family History  Problem Relation Age of Onset  . Asthma Brother   . Seizures Unknown        paternal cousin    Social History:  reports that she has never smoked. She has never used smokeless tobacco. She reports that she does not drink alcohol or use drugs.  ROS: UROLOGY Frequent Urination?: Yes Hard to postpone urination?: No Burning/pain with urination?: No Get up at night to urinate?:  No Leakage of urine?: No Urine stream starts and stops?: No Trouble starting stream?: No Do you have to strain to urinate?: No Blood in urine?: No Urinary tract infection?: No Sexually transmitted disease?: No Injury to kidneys or bladder?: No Painful intercourse?: No Weak stream?: No Currently pregnant?: No Vaginal bleeding?: No Last menstrual period?: n  Gastrointestinal Nausea?: No Vomiting?: No Indigestion/heartburn?: No Diarrhea?: No Constipation?: No  Constitutional Fever: No Night sweats?: No Weight loss?: No Fatigue?: No  Skin Skin rash/lesions?: No Itching?: No  Eyes Blurred vision?: No Double vision?: No  Ears/Nose/Throat Sore throat?: No Sinus problems?: No  Hematologic/Lymphatic Swollen glands?: No Easy bruising?: No  Cardiovascular Leg swelling?: No Chest pain?: No  Respiratory Cough?: No Shortness of breath?: No  Endocrine Excessive thirst?: No  Musculoskeletal Back pain?: No Joint pain?: No  Neurological Headaches?: No Dizziness?: No  Psychologic Depression?: No Anxiety?: No  Physical Exam: BP 102/72   Pulse 87   Ht 5\' 2"   (1.575 m)   Wt 144 lb 4.8 oz (65.5 kg)   BMI 26.39 kg/m   Constitutional: Well nourished. Alert.  No acute distress. HEENT: [place] AT, moist mucus membranes. Trachea midline, no masses. Cardiovascular: No clubbing, cyanosis, or edema. Respiratory: Normal respiratory effort, no increased work of breathing. Skin: No rashes, bruises or suspicious lesions. Lymph: No cervical or inguinal adenopathy. Neurologic: Grossly intact, no focal deficits, moving all 4 extremities. Psychiatric: Normal mood and affect.    Laboratory Data: Lab Results  Component Value Date   WBC 3.6 06/08/2017   HGB 14.1 06/08/2017   HCT 41.9 06/08/2017   MCV 95 06/08/2017   PLT 88 (LL) 06/08/2017    Lab Results  Component Value Date   CREATININE 0.73 04/28/2017    Lab Results  Component Value Date   AST 14 04/28/2017   Lab Results  Component Value Date   ALT 8 04/28/2017   I have reviewed the labs.  Pertinent Imaging CLINICAL DATA:  Seizures. Polydipsia. Renal calyceal dilatation on ultrasound. Hematuria.  EXAM: CT ABDOMEN AND PELVIS WITHOUT CONTRAST  TECHNIQUE: Multidetector CT imaging of the abdomen and pelvis was performed following the standard protocol without IV contrast.  COMPARISON:  10/04/2017 ultrasound examination.  FINDINGS: Despite efforts by the technologist and patient, motion artifact is present on today's exam and could not be eliminated. This reduces exam sensitivity and specificity.  Lower chest: Small bilateral pleural effusions with associated passive atelectasis. Calcification of the mitral valve.  Hepatobiliary: Unremarkable  Pancreas: Unremarkable  Spleen: Unremarkable  Adrenals/Urinary Tract: Non rotated right kidney with a somewhat flattened contour. Bilateral extrarenal pelvis but without a significant degree of calyceal dilatation an without hydroureter. No urinary tract calculi are observed.  Stomach/Bowel: Prominent stool throughout the  colon favors constipation. Notable prominence of stool in the rectal vault as on image 58/5.  Vascular/Lymphatic: Aortoiliac atherosclerotic vascular disease.  Reproductive: Uterus absent.  Adnexa unremarkable.  Other: 1.2 by 1.0 by 1.0 cm fluid density lesion just above the left side of the urinary bladder on image 39/5, possibly a small bladder diverticulum, low-density lymph node, or duplication cyst.  Musculoskeletal: There is some mild bony heterogeneity in the right iliac bone which is nonspecific but probably benign.  IMPRESSION: 1. Bilateral extrarenal pelvis which may partially explain the fullness of the collecting systems. There is no current calyceal dilatation or ureteral dilatation, and no urinary tract calculi are identified. 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 2. Small bilateral pleural effusions with associated passive atelectasis. 3. Calcification of the aortic valve. Aortic Atherosclerosis (ICD10-I70.0). 4. Prominent stool throughout constipation with notable prominence of stool in the rectal vault. Correlate with bowel habits in assessing for mild fecal impaction. 5. Small fluid density lesion just above the left side of the urinary bladder, probably a small bladder diverticulum or similar incidental finding. 6. There is some bony heterogeneity in the right iliac bone, most likely benign.   Electronically Signed   By: Van Clines M.D.   On: 11/20/2017 16:11 I have independently reviewed the films.    Assessment & Plan:   1. Calyceal dilatation  - bilateral extrarenal pelvis are noted which explain the appearance of calyceal dilatation  - non -rotated right kidney which may place the patient at risk for UPJ in the future - will obtain a RUS yearly as patient is non communicative   2.  Bladder diverticulum  - no intervention warranted   3. Urgency             - continue Myrbetriq 25 mg and oxybutynin 5 mg tid               - CT demonstrated constipation - increase the MiraLax to daily administration              - due to patient's MR, she would not tolerate UDS             - have the patient to continue the Myrbetriq and oxybutynin  4. Vaginal atrophy             - continue vaginal estrogen cream three nights weekly             - She will follow up in one year for an exam.    5. Incontinence              -continue the oxybutynin 5 mL tid and  Myrbetriq 25 mg daily  6. History of recurrent UTI's             - constipation identified on CT scan which is a risk factor for UTI's                               Return in about 1 year (around 11/30/2018) for RUS and PVR.  These notes generated with voice recognition software. I apologize for typographical errors.  Zara Council, [facility] [address], [place], [place] [address] [phone]

## 2017-11-29 ENCOUNTER — Ambulatory Visit (INDEPENDENT_AMBULATORY_CARE_PROVIDER_SITE_OTHER): Payer: Medicare Other | Admitting: Urology

## 2017-11-29 ENCOUNTER — Encounter: Payer: Self-pay | Admitting: Urology

## 2017-11-29 ENCOUNTER — Other Ambulatory Visit: Payer: Self-pay

## 2017-11-29 VITALS — BP 102/72 | HR 87 | Ht 62.0 in | Wt 144.3 lb

## 2017-11-29 DIAGNOSIS — N323 Diverticulum of bladder: Secondary | ICD-10-CM | POA: Diagnosis not present

## 2017-11-29 DIAGNOSIS — N952 Postmenopausal atrophic vaginitis: Secondary | ICD-10-CM | POA: Diagnosis not present

## 2017-11-29 DIAGNOSIS — R3915 Urgency of urination: Secondary | ICD-10-CM | POA: Diagnosis not present

## 2017-11-29 DIAGNOSIS — Z87440 Personal history of urinary (tract) infections: Secondary | ICD-10-CM | POA: Diagnosis not present

## 2017-11-29 DIAGNOSIS — N3946 Mixed incontinence: Secondary | ICD-10-CM | POA: Diagnosis not present

## 2017-12-22 ENCOUNTER — Other Ambulatory Visit: Payer: Self-pay | Admitting: Urology

## 2017-12-27 ENCOUNTER — Other Ambulatory Visit: Payer: Self-pay

## 2017-12-27 MED ORDER — OXYBUTYNIN CHLORIDE 5 MG/5ML PO SYRP: ORAL_SOLUTION | ORAL | 11 refills | Status: DC

## 2018-01-01 ENCOUNTER — Encounter: Payer: Self-pay | Admitting: Podiatry

## 2018-01-01 ENCOUNTER — Ambulatory Visit (INDEPENDENT_AMBULATORY_CARE_PROVIDER_SITE_OTHER): Payer: Medicare Other | Admitting: Podiatry

## 2018-01-01 DIAGNOSIS — B351 Tinea unguium: Secondary | ICD-10-CM

## 2018-01-01 DIAGNOSIS — M79676 Pain in unspecified toe(s): Secondary | ICD-10-CM

## 2018-01-01 NOTE — Progress Notes (Addendum)
Complaint:  Visit Type: Patient returns to my office for continued preventative foot care services. Complaint: Patient states" my nails have grown long and thick and become painful to walk and wear shoes"  The patient presents for preventative foot care services. She presents to the office with her guardians  Podiatric Exam: Vascular: dorsalis pedis and posterior tibial pulses are palpable bilateral. Capillary return is immediate. Temperature gradient is WNL. Skin turgor WNL  Sensorium: Normal Semmes Weinstein monofilament test. Normal tactile sensation bilaterally. Nail Exam: Pt has thick disfigured discolored nails with subungual debris noted bilateral entire nail hallux through fifth toenails Ulcer Exam: There is no evidence of ulcer or pre-ulcerative changes or infection. Orthopedic Exam: Muscle tone and strength are WNL. No limitations in general ROM. No crepitus or effusions noted. Foot type and digits show no abnormalities.  Skin: No Porokeratosis. No infection or ulcers.    Diagnosis:  Onychomycosis, , Pain in right toe, pain in left toes  Treatment & Plan Procedures and Treatment: Consent by patient was obtained for treatment procedures. The patient understood the discussion of treatment and procedures well. All questions were answered thoroughly reviewed. Debridement of mycotic and hypertrophic toenails, 1 through 5 bilateral and clearing of subungual debris. No ulceration, no infection noted. . ABN signed for 2019.  Return Visit-Office Procedure: Patient instructed to return to the office for a follow up visit 3 months for continued evaluation and treatment.    Gardiner Barefoot DPM

## 2018-03-12 ENCOUNTER — Other Ambulatory Visit: Payer: Self-pay | Admitting: Neurology

## 2018-04-02 ENCOUNTER — Ambulatory Visit (INDEPENDENT_AMBULATORY_CARE_PROVIDER_SITE_OTHER): Payer: Medicare Other | Admitting: Podiatry

## 2018-04-02 ENCOUNTER — Encounter: Payer: Self-pay | Admitting: Podiatry

## 2018-04-02 DIAGNOSIS — M79676 Pain in unspecified toe(s): Secondary | ICD-10-CM | POA: Diagnosis not present

## 2018-04-02 DIAGNOSIS — B351 Tinea unguium: Secondary | ICD-10-CM | POA: Diagnosis not present

## 2018-04-02 NOTE — Progress Notes (Signed)
Complaint:  Visit Type: Patient returns to my office for continued preventative foot care services. Complaint: Patient states" my nails have grown long and thick and become painful to walk and wear shoes"  The patient presents for preventative foot care services. She presents to the office with her guardians  Podiatric Exam: Vascular: dorsalis pedis and posterior tibial pulses are palpable bilateral. Capillary return is immediate. Temperature gradient is WNL. Skin turgor WNL  Sensorium: Normal Semmes Weinstein monofilament test. Normal tactile sensation bilaterally. Nail Exam: Pt has thick disfigured discolored nails with subungual debris noted bilateral entire nail hallux through fifth toenails Ulcer Exam: There is no evidence of ulcer or pre-ulcerative changes or infection. Orthopedic Exam: Muscle tone and strength are WNL. No limitations in general ROM. No crepitus or effusions noted. Foot type and digits show no abnormalities.  Skin: No Porokeratosis. No infection or ulcers.    Diagnosis:  Onychomycosis, , Pain in right toe, pain in left toes  Treatment & Plan Procedures and Treatment: Consent by patient was obtained for treatment procedures. The patient understood the discussion of treatment and procedures well. All questions were answered thoroughly reviewed. Debridement of mycotic and hypertrophic toenails, 1 through 5 bilateral and clearing of subungual debris. No ulceration, no infection noted. . ABN signed for 2019.  Return Visit-Office Procedure: Patient instructed to return to the office for a follow up visit 4 months for continued evaluation and treatment.    Gardiner Barefoot DPM

## 2018-04-30 ENCOUNTER — Encounter: Payer: Self-pay | Admitting: Neurology

## 2018-04-30 ENCOUNTER — Ambulatory Visit (INDEPENDENT_AMBULATORY_CARE_PROVIDER_SITE_OTHER): Payer: Medicare Other | Admitting: Neurology

## 2018-04-30 VITALS — BP 90/60 | HR 72 | Ht 62.0 in | Wt 141.0 lb

## 2018-04-30 DIAGNOSIS — Z5181 Encounter for therapeutic drug level monitoring: Secondary | ICD-10-CM

## 2018-04-30 NOTE — Progress Notes (Signed)
Reason for visit: Seizures, mental retardation  Roberta Hampton is an 52 y.o. female  History of present illness:  Roberta Hampton is a 52 year old right-handed white female with a history of mental retardation.  The patient lives in an adult home, she comes in with a Librarian, academic.  The patient has not had any seizures since last seen, she has been faithfully using a walker for ambulation, she has not sustained any falls.  The patient still has a lot of drowsiness during the day, she continues on Risperdal and Seroquel in high-dose.  The patient has had a low platelet level previously on blood work.  She has not had any blood work done since last seen.  The patient returns to this office for an evaluation.  The patient remains on Depakote.  She otherwise tolerates this drug fairly well.  Past Medical History:  Diagnosis Date  . Attention deficit disorder   . Hyponatremia    associated with polydipsia  . Mental retardation   . Obesity   . Seizure disorder ([place])   . Seizures ([place])     Past Surgical History:  Procedure Laterality Date  . ABDOMINAL HYSTERECTOMY    . ORIF ANKLE FRACTURE Left 05/08/2015   Procedure: OPEN REDUCTION INTERNAL FIXATION (ORIF) ANKLE FRACTURE;  Surgeon: Corky Mull, MD;  Location: [facility] ORS;  Service: Orthopedics;  Laterality: Left;  Marland Kitchen VAGINAL HYSTERECTOMY      Family History  Problem Relation Age of Onset  . Asthma Brother   . Seizures Unknown        paternal cousin    Social history:  reports that she has never smoked. She has never used smokeless tobacco. She reports that she does not drink alcohol or use drugs.   No Known Allergies  Medications:  Prior to Admission medications   Medication Sig Start Date End Date Taking? Authorizing Provider  Calcium Carb-Cholecalciferol (CALCIUM 600 + D PO) Take 600 mg by mouth 2 (two) times daily.   Yes [provider]  Docusate Sodium (DIOCTO) 150 MG/15ML syrup Take 50 mg by mouth 2 (two) times daily.   Yes  [provider]  estradiol (ESTRACE VAGINAL) 0.1 MG/GM vaginal cream Apply 0.5mg  (pea-sized amount)  just inside the vaginal introitus with a finger-tip every night for two weeks and then Monday, Wednesday and Friday nights. 07/12/17  Yes McGowan, Larene Beach A, PA-C  fluvoxaMINE (LUVOX) 25 MG tablet Take 25 mg by mouth at bedtime.   Yes [provider]  guaiFENesin (ROBITUSSIN) 100 MG/5ML SOLN Take 5 mLs by mouth every 4 (four) hours as needed for cough or to loosen phlegm.   Yes [provider]  loratadine (CLARITIN) 10 MG tablet Take 10 mg by mouth daily.   Yes [provider]  mirabegron ER (MYRBETRIQ) 25 MG TB24 tablet Take 25 mg by mouth daily.   Yes [provider]  Multiple Vitamin (THEREMS PO) Take 1 tablet by mouth daily.   Yes [provider]  oxybutynin (DITROPAN) 5 MG/5ML syrup TAKE 5 ML BY MOUTH THREE TIMES A DAY 12/27/17  Yes McGowan, Larene Beach A, PA-C  polyethylene glycol (MIRALAX / GLYCOLAX) packet Take 17 g by mouth daily.   Yes [provider]  QUEtiapine (SEROQUEL) 400 MG tablet Take 800 mg by mouth at bedtime.    Yes [provider]  risperiDONE (RISPERDAL) 1 MG tablet Take 1 mg by mouth every morning.   Yes [provider]  risperidone (RISPERDAL) 4 MG tablet Take  4 mg by mouth at bedtime.   Yes [provider]  valproic acid (DEPAKENE) 250 MG/5ML SOLN solution TAKE 10ML (500MG ) BY MOUTH EVERY MORNING TAKE 7.5ML (375MG ) BY MOUTH AT BEDTIME 03/12/18  Yes Kathrynn Ducking, MD  vitamin E 200 UNIT capsule Take 200 Units by mouth daily.   Yes [provider]    ROS:  Out of a complete 14 system review of symptoms, the patient complains only of the following symptoms, and all other reviewed systems are negative.  Daytime sleepiness Walking problems  Blood pressure 90/60, pulse 72, height 5\' 2"  (1.575 m), weight 141 lb (64 kg).  Physical Exam  General: The patient is alert and  cooperative at the time of the examination.  The patient is moderately obese.  Skin: No significant peripheral edema is noted.   Neurologic Exam  Mental status: The patient is alert and oriented x 2 at the time of the examination (not oriented to date).   Cranial nerves: Facial symmetry is present. Speech is normal, no aphasia or dysarthria is noted. Extraocular movements are full. Visual fields are full.  Motor: The patient has good strength in all 4 extremities.  Sensory examination: Soft touch sensation is symmetric on the face, arms, and legs.  Coordination: The patient has good finger-nose-finger and heel-to-shin bilaterally.  Gait and station: The patient has a slightly wide-based gait, the patient walks with a walker, she has good stability with the walker.  Reflexes: Deep tendon reflexes are symmetric.   Assessment/Plan:  1.  Mental retardation  2.  Gait disorder  3.  History of seizures  4.  Thrombocytopenia  5.  Excessive daytime drowsiness  Blood work will be done today to check the Depakote level and the CBC and comprehensive metabolic profile.  The Depakote will be continued, she will follow-up in 1 year, sooner if needed.  The thrombocytopenia does need to be followed over time.  Jill Alexanders MD 04/30/2018 10:19 AM  [facility] [address] [place], [place] [address]  Phone [phone] Fax [phone]

## 2018-05-01 ENCOUNTER — Telehealth: Payer: Self-pay | Admitting: Neurology

## 2018-05-01 LAB — CBC WITH DIFFERENTIAL/PLATELET
BASOS ABS: 0 10*3/uL (ref 0.0–0.2)
Basos: 1 %
EOS (ABSOLUTE): 0 10*3/uL (ref 0.0–0.4)
Eos: 0 %
Hematocrit: 41.7 % (ref 34.0–46.6)
Hemoglobin: 13.8 g/dL (ref 11.1–15.9)
IMMATURE GRANS (ABS): 0.1 10*3/uL (ref 0.0–0.1)
IMMATURE GRANULOCYTES: 2 %
LYMPHS: 40 %
Lymphocytes Absolute: 1.5 10*3/uL (ref 0.7–3.1)
MCH: 32.2 pg (ref 26.6–33.0)
MCHC: 33.1 g/dL (ref 31.5–35.7)
MCV: 97 fL (ref 79–97)
MONOCYTES: 19 %
Monocytes Absolute: 0.7 10*3/uL (ref 0.1–0.9)
NEUTROS PCT: 38 %
Neutrophils Absolute: 1.4 10*3/uL (ref 1.4–7.0)
Platelets: 102 10*3/uL — ABNORMAL LOW (ref 150–450)
RBC: 4.28 x10E6/uL (ref 3.77–5.28)
RDW: 12.7 % (ref 12.3–15.4)
WBC: 3.7 10*3/uL (ref 3.4–10.8)

## 2018-05-01 LAB — COMPREHENSIVE METABOLIC PANEL
A/G RATIO: 2.2 (ref 1.2–2.2)
ALT: 7 IU/L (ref 0–32)
AST: 9 IU/L (ref 0–40)
Albumin: 3.7 g/dL (ref 3.5–5.5)
Alkaline Phosphatase: 55 IU/L (ref 39–117)
BILIRUBIN TOTAL: 0.3 mg/dL (ref 0.0–1.2)
BUN/Creatinine Ratio: 30 — ABNORMAL HIGH (ref 9–23)
BUN: 18 mg/dL (ref 6–24)
CALCIUM: 9.1 mg/dL (ref 8.7–10.2)
CHLORIDE: 104 mmol/L (ref 96–106)
CO2: 26 mmol/L (ref 20–29)
Creatinine, Ser: 0.61 mg/dL (ref 0.57–1.00)
GFR calc Af Amer: 121 mL/min/{1.73_m2} (ref >59)
GFR, EST NON AFRICAN AMERICAN: 105 mL/min/{1.73_m2} (ref >59)
GLUCOSE: 36 mg/dL — AB (ref 65–99)
Globulin, Total: 1.7 g/dL (ref 1.5–4.5)
POTASSIUM: 3.9 mmol/L (ref 3.5–5.2)
Sodium: 142 mmol/L (ref 134–144)
Total Protein: 5.4 g/dL — ABNORMAL LOW (ref 6.0–8.5)

## 2018-05-01 LAB — VALPROIC ACID LEVEL: VALPROIC ACID LVL: 98 ug/mL (ref 50–100)

## 2018-05-01 NOTE — Telephone Encounter (Signed)
I returned called to the answering ervice from [facility] notifying me of a critical value of patients blood glucose of 36 mg percent from labs drawn from yesterday. Valproic acid level, CBC and rest of basic metabolic panel labs were normal.

## 2018-05-01 NOTE — Telephone Encounter (Signed)
I called the caretaker regarding the patient.  The blood work shows a good therapeutic Depakote level, no change in dosing recommended.  The patient has had improvement in her platelet level, now is above 100.  Of concern is that the blood sugar was 36, the patient was noted to be drowsy during the evaluation, caretakers indicated that drowsiness has been a problem for her, this could herald the onset of diabetes, she may need further medical work-up in this regard.  The use of Risperdal may increase the risk for diabetes.

## 2018-05-08 ENCOUNTER — Other Ambulatory Visit: Payer: Self-pay | Admitting: *Deleted

## 2018-05-08 MED ORDER — VALPROIC ACID 250 MG/5ML PO SOLN: ORAL | 5 refills | Status: DC

## 2018-06-07 ENCOUNTER — Other Ambulatory Visit: Payer: Self-pay | Admitting: Neurology

## 2018-06-07 ENCOUNTER — Other Ambulatory Visit: Payer: Self-pay | Admitting: Urology

## 2018-06-19 ENCOUNTER — Other Ambulatory Visit: Payer: Self-pay | Admitting: Urology

## 2018-06-19 ENCOUNTER — Other Ambulatory Visit: Payer: Self-pay | Admitting: Neurology

## 2018-07-16 ENCOUNTER — Other Ambulatory Visit: Payer: Self-pay | Admitting: Family Medicine

## 2018-07-16 DIAGNOSIS — Z1231 Encounter for screening mammogram for malignant neoplasm of breast: Secondary | ICD-10-CM

## 2018-07-28 ENCOUNTER — Other Ambulatory Visit: Payer: Self-pay | Admitting: Urology

## 2018-07-30 ENCOUNTER — Ambulatory Visit (INDEPENDENT_AMBULATORY_CARE_PROVIDER_SITE_OTHER): Payer: Medicare Other | Admitting: Podiatry

## 2018-07-30 ENCOUNTER — Encounter: Payer: Self-pay | Admitting: Podiatry

## 2018-07-30 DIAGNOSIS — B351 Tinea unguium: Secondary | ICD-10-CM | POA: Diagnosis not present

## 2018-07-30 DIAGNOSIS — M79676 Pain in unspecified toe(s): Secondary | ICD-10-CM | POA: Diagnosis not present

## 2018-07-30 NOTE — Progress Notes (Signed)
Complaint:  Visit Type: Patient returns to my office for continued preventative foot care services. Complaint: Patient states" my nails have grown long and thick and become painful to walk and wear shoes"  The patient presents for preventative foot care services. She presents to the office with her guardians  Podiatric Exam: Vascular: dorsalis pedis and posterior tibial pulses are palpable bilateral. Capillary return is immediate. Temperature gradient is WNL. Skin turgor WNL  Sensorium: Normal Semmes Weinstein monofilament test. Normal tactile sensation bilaterally. Nail Exam: Pt has thick disfigured discolored nails with subungual debris noted bilateral entire nail hallux through fifth toenails Ulcer Exam: There is no evidence of ulcer or pre-ulcerative changes or infection. Orthopedic Exam: Muscle tone and strength are WNL. No limitations in general ROM. No crepitus or effusions noted. Foot type and digits show no abnormalities.  Skin: No Porokeratosis. No infection or ulcers.    Diagnosis:  Onychomycosis, , Pain in right toe, pain in left toes  Treatment & Plan Procedures and Treatment: Consent by patient was obtained for treatment procedures. The patient understood the discussion of treatment and procedures well. All questions were answered thoroughly reviewed. Debridement of mycotic and hypertrophic toenails, 1 through 5 bilateral and clearing of subungual debris. No ulceration, no infection noted. . ABN signed for 2019.  Iatrogenic skin lesion fifth toe due to her movement. Minimal bleeding noted. Return Visit-Office Procedure: Patient instructed to return to the office for a follow up visit 4 months for continued evaluation and treatment.    Gardiner Barefoot DPM

## 2018-08-15 ENCOUNTER — Emergency Department
Admission: EM | Admit: 2018-08-15 | Discharge: 2018-08-15 | Disposition: A | Discharge status: Home / Self Care | Payer: Medicare Other | Attending: Emergency Medicine | Admitting: Emergency Medicine

## 2018-08-15 ENCOUNTER — Emergency Department: Payer: Medicare Other

## 2018-08-15 ENCOUNTER — Encounter: Payer: Self-pay | Admitting: Emergency Medicine

## 2018-08-15 ENCOUNTER — Ambulatory Visit
Admission: RE | Admit: 2018-08-15 | Discharge: 2018-08-15 | Disposition: A | Discharge status: Home / Self Care | Payer: Medicare Other | Source: Ambulatory Visit | Attending: Family Medicine | Admitting: Family Medicine

## 2018-08-15 DIAGNOSIS — W010XXA Fall on same level from slipping, tripping and stumbling without subsequent striking against object, initial encounter: Secondary | ICD-10-CM | POA: Diagnosis not present

## 2018-08-15 DIAGNOSIS — S0003XA Contusion of scalp, initial encounter: Secondary | ICD-10-CM | POA: Diagnosis not present

## 2018-08-15 DIAGNOSIS — Z79899 Other long term (current) drug therapy: Secondary | ICD-10-CM | POA: Diagnosis not present

## 2018-08-15 DIAGNOSIS — S0990XA Unspecified injury of head, initial encounter: Secondary | ICD-10-CM | POA: Diagnosis present

## 2018-08-15 DIAGNOSIS — Y929 Unspecified place or not applicable: Secondary | ICD-10-CM | POA: Diagnosis not present

## 2018-08-15 DIAGNOSIS — Z1231 Encounter for screening mammogram for malignant neoplasm of breast: Secondary | ICD-10-CM

## 2018-08-15 DIAGNOSIS — Y939 Activity, unspecified: Secondary | ICD-10-CM | POA: Insufficient documentation

## 2018-08-15 DIAGNOSIS — Y999 Unspecified external cause status: Secondary | ICD-10-CM | POA: Insufficient documentation

## 2018-08-15 NOTE — ED Notes (Signed)
Mother, legal guardian attempted to call at this time with no answer by this RN . Caregiver at bedside.

## 2018-08-15 NOTE — ED Notes (Signed)
Linna Hoff, caregiver from group home verbalizes d/c understanding and follow up. Pt in NAD, refuses VS at d/c. Pt in wheelchair

## 2018-08-15 NOTE — ED Provider Notes (Signed)
[facility] Emergency Department Provider Note   ____________________________________________   First MD Initiated Contact with Patient 08/15/18 1000     (approximate)  I have reviewed the triage vital signs and the nursing notes.   HISTORY  Chief Complaint Head Injury    HPI Roberta Hampton is a 52 y.o. female patient presents with a cervical scalp pain secondary to fall.  Patient was given a mammogram when she fell backwards striking her head.  There was no loss of consciousness.  Patient denies neck pain.  Patient denies vertigo or vision disturbance.  Patient is here with her caregiver status no abnormal behavior does not consistent with her mental retardation.  Past Medical History:  Diagnosis Date  . Attention deficit disorder   . Hyponatremia    associated with polydipsia  . Mental retardation   . Obesity   . Seizure disorder ([place])   . Seizures [facility])     Patient Active Problem List   Diagnosis Date Noted  . Developmental delay 10/02/2017  . Encounter for general adult medical examination without abnormal findings 12/17/2015  . Pressure ulcer 06/28/2015  . Sepsis affecting skin 06/27/2015  . Ankle fracture, bimalleolar, closed 05/11/2015  . Ankle fracture 05/08/2015  . 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 Encounter for therapeutic drug monitoring 07/22/2013  . Mental retardation 07/22/2013    Past Surgical History:  Procedure Laterality Date  . ABDOMINAL HYSTERECTOMY    . ORIF ANKLE FRACTURE Left 05/08/2015   Procedure: OPEN REDUCTION INTERNAL FIXATION (ORIF) ANKLE FRACTURE;  Surgeon: Corky Mull, MD;  Location: [facility] ORS;  Service: Orthopedics;  Laterality: Left;  Marland Kitchen VAGINAL HYSTERECTOMY      Prior to Admission medications   Medication Sig Start Date End Date Taking? Authorizing Provider  Calcium Carb-Cholecalciferol (CALCIUM 600 + D PO) Take 600 mg by mouth 2 (two) times daily.    [provider]  Docusate Sodium (DIOCTO) 150 MG/15ML syrup Take 50 mg by mouth 2 (two) times daily.    [provider]  estradiol (ESTRACE VAGINAL) 0.1 MG/GM vaginal cream Apply 0.5mg  (pea-sized amount)  just inside the vaginal introitus with a finger-tip every night for two weeks and then Monday, Wednesday and Friday nights. 07/12/17   Zara Council A, PA-C  fluvoxaMINE (LUVOX) 25 MG tablet Take 25 mg by mouth at bedtime.    [provider]  guaiFENesin (ROBITUSSIN) 100 MG/5ML SOLN Take 5 mLs by mouth every 4 (four) hours as needed for cough or to loosen phlegm.    [provider]  loratadine (CLARITIN) 10 MG tablet Take 10 mg by mouth daily.    [provider]  Multiple Vitamin (THEREMS PO) Take 1 tablet by mouth daily.    [provider]  MYRBETRIQ 25 MG TB24 tablet TAKE ONE TABLET BY MOUTH EVERY DAY *DO NOT CRUSH* 06/11/18   McGowan, Larene Beach A, PA-C  oxybutynin (DITROPAN) 5 MG/5ML syrup TAKE 5 ML (5MG ) BY MOUTH THREE TIMES A DAY 07/30/18   McGowan, Larene Beach A, PA-C  polyethylene glycol (MIRALAX / GLYCOLAX) packet Take 17 g by mouth daily.    [provider]  QUEtiapine (SEROQUEL) 400 MG tablet Take 800 mg by mouth at bedtime.     [provider]  risperiDONE (RISPERDAL) 1 MG tablet Take 1 mg by mouth every morning.    [provider]  risperidone (RISPERDAL) 4 MG tablet Take 4 mg by mouth at bedtime.    [provider]  valproic acid (DEPAKENE) 250 MG/5ML SOLN solution TAKE 10ML (500MG ) BY MOUTH EVERY MORNING TAKE 7.5ML (375MG ) BY MOUTH AT BEDTIME 05/08/18   Kathrynn Ducking, MD  vitamin E 200 UNIT capsule Take 200 Units by mouth daily.    [provider]    Allergies Patient has no known allergies.  Family History  Problem Relation Age of Onset  . Asthma Brother   . Seizures Unknown        paternal cousin  . Breast cancer Neg Hx     Social History Social History   Tobacco Use  . Smoking  status: Never Smoker  . Smokeless tobacco: Never Used  Substance Use Topics  . Alcohol use: No  . Drug use: No    Review of Systems  Constitutional: No fever/chills Eyes: No visual changes. ENT: No sore throat. Cardiovascular: Denies chest pain. Respiratory: Denies shortness of breath. Gastrointestinal: No abdominal pain.  No nausea, no vomiting.  No diarrhea.  No constipation. Genitourinary: Negative for dysuria. Musculoskeletal: Negative for back pain. Skin: Negative for rash. Neurological: Seizure disorder Psychiatric:ADHD and mental retardation. ____________________________________________   PHYSICAL EXAM:  VITAL SIGNS: ED Triage Vitals  Enc Vitals Group     BP 08/15/18 0919 107/71     Pulse Rate 08/15/18 0919 84     Resp 08/15/18 0919 16     Temp --      Temp src --      SpO2 08/15/18 0919 98 %     Weight 08/15/18 0857 141 lb 1.5 oz (64 kg)     Height --      Head Circumference --      Peak Flow --      Pain Score --      Pain Loc --      Pain Edu? --      Excl. in [place]? --     Constitutional: Alert and oriented. Well appearing and in no acute distress. Eyes: Conjunctivae are normal. PERRL. EOMI. Head: Atraumatic. Nose: No congestion/rhinnorhea. Mouth/Throat: Mucous membranes are moist.  Oropharynx non-erythematous. Neck: No stridor.  No cervical spine tenderness to palpation. Hematological/Lymphatic/Immunilogical: No cervical lymphadenopathy. Cardiovascular: Normal rate, regular rhythm. Grossly normal heart sounds.  Good peripheral circulation. Respiratory: Normal respiratory effort.  No retractions. Lungs CTAB. Musculoskeletal: No lower extremity tenderness nor edema.  No joint effusions. Neurologic:  Normal speech and language. No gross focal neurologic deficits are appreciated. No gait instability. Skin:  Skin is warm, dry and intact. No rash noted. Psychiatric: Mood and affect are normal. Speech and behavior are  normal.  ____________________________________________   LABS (all labs ordered are listed, but only abnormal results are displayed)  Labs Reviewed - No data to display ____________________________________________  EKG   ____________________________________________  RADIOLOGY  ED MD interpretation:    Official radiology report(s): Dg Skull Complete  Result Date: 08/15/2018 CLINICAL DATA:  Fall, hit head. EXAM: SKULL - COMPLETE 4 + VIEW COMPARISON:  CT 11/19/2016 FINDINGS: There is no evidence of skull fracture or other focal bone lesions. IMPRESSION: Negative. Electronically Signed   By: Rolm Baptise M.D.   On: 08/15/2018 10:37    ____________________________________________   PROCEDURES  Procedure(s) performed: None  Procedures  Critical Care performed: No  ____________________________________________   INITIAL IMPRESSION / ASSESSMENT AND PLAN / ED COURSE  As part of my medical decision making, I reviewed the following data within the [place]    Patient presents with posterior scalp pain secondary to fall.  Patient neurovascular intact.  Care giver states no change in her baseline mental retardation status post fall.  Discussed negative x-ray findings with caregiver.  Patient given discharge care instruction advised follow-up PCP.      ____________________________________________   FINAL CLINICAL IMPRESSION(S) / ED DIAGNOSES  Final diagnoses:  Contusion of scalp, initial encounter     ED Discharge Orders    None       Note:  This document was prepared using Dragon voice recognition software and may include unintentional dictation errors.    Sable Feil, PA-C 08/15/18 1055    Drenda Freeze, MD 08/15/18 [phone]

## 2018-08-15 NOTE — ED Triage Notes (Signed)
While in mammography this morning, patient fell backward and hit head.  No LOC.

## 2018-08-22 ENCOUNTER — Other Ambulatory Visit: Payer: Self-pay | Admitting: Neurology

## 2018-08-22 ENCOUNTER — Other Ambulatory Visit: Payer: Self-pay | Admitting: Urology

## 2018-09-10 ENCOUNTER — Ambulatory Visit
Admission: RE | Admit: 2018-09-10 | Discharge: 2018-09-10 | Disposition: A | Discharge status: Home / Self Care | Payer: Medicare Other | Source: Ambulatory Visit | Attending: Family Medicine | Admitting: Family Medicine

## 2018-09-10 DIAGNOSIS — Z1231 Encounter for screening mammogram for malignant neoplasm of breast: Secondary | ICD-10-CM | POA: Diagnosis present

## 2018-09-18 ENCOUNTER — Other Ambulatory Visit: Payer: Self-pay

## 2018-09-18 MED ORDER — ESTROGENS, CONJUGATED 0.625 MG/GM VA CREA: 1.0000 | TOPICAL_CREAM | Freq: Every day | VAGINAL | 12 refills | Status: DC

## 2018-10-02 ENCOUNTER — Other Ambulatory Visit: Payer: Self-pay | Admitting: Urology

## 2018-10-03 ENCOUNTER — Other Ambulatory Visit: Payer: Self-pay | Admitting: Urology

## 2018-10-06 ENCOUNTER — Other Ambulatory Visit: Payer: Self-pay | Admitting: Urology

## 2018-10-08 ENCOUNTER — Other Ambulatory Visit: Payer: Self-pay | Admitting: Urology

## 2018-11-03 ENCOUNTER — Other Ambulatory Visit: Payer: Self-pay | Admitting: Urology

## 2018-11-14 ENCOUNTER — Other Ambulatory Visit: Payer: Self-pay | Admitting: Urology

## 2018-11-14 ENCOUNTER — Other Ambulatory Visit: Payer: Self-pay | Admitting: Neurology

## 2018-11-22 ENCOUNTER — Other Ambulatory Visit: Payer: Self-pay | Admitting: Urology

## 2018-11-22 ENCOUNTER — Telehealth: Payer: Self-pay | Admitting: Urology

## 2018-11-22 DIAGNOSIS — N2889 Other specified disorders of kidney and ureter: Secondary | ICD-10-CM

## 2018-11-22 NOTE — Telephone Encounter (Signed)
You wanted this patient to return in a year with a RUS prior  There is no order for that    Thanks, Sharyn Lull

## 2018-11-26 ENCOUNTER — Ambulatory Visit (INDEPENDENT_AMBULATORY_CARE_PROVIDER_SITE_OTHER): Payer: Medicare Other | Admitting: Podiatry

## 2018-11-26 ENCOUNTER — Other Ambulatory Visit: Payer: Self-pay

## 2018-11-26 ENCOUNTER — Encounter: Payer: Self-pay | Admitting: Podiatry

## 2018-11-26 DIAGNOSIS — B351 Tinea unguium: Secondary | ICD-10-CM | POA: Diagnosis not present

## 2018-11-26 DIAGNOSIS — M79676 Pain in unspecified toe(s): Secondary | ICD-10-CM | POA: Diagnosis not present

## 2018-11-26 NOTE — Progress Notes (Signed)
Complaint:  Visit Type: Patient returns to my office for continued preventative foot care services. Complaint: Patient states" my nails have grown long and thick and become painful to walk and wear shoes"  The patient presents for preventative foot care services. She presents to the office with her guardians  Podiatric Exam: Vascular: dorsalis pedis and posterior tibial pulses are palpable bilateral. Capillary return is immediate. Temperature gradient is WNL. Skin turgor WNL  Sensorium: Normal Semmes Weinstein monofilament test. Normal tactile sensation bilaterally. Nail Exam: Pt has thick disfigured discolored nails with subungual debris noted bilateral entire nail hallux through fifth toenails Ulcer Exam: There is no evidence of ulcer or pre-ulcerative changes or infection. Orthopedic Exam: Muscle tone and strength are WNL. No limitations in general ROM. No crepitus or effusions noted. Foot type and digits show no abnormalities.  Skin: No Porokeratosis. No infection or ulcers.    Diagnosis:  Onychomycosis, , Pain in right toe, pain in left toes  Treatment & Plan Procedures and Treatment: Consent by patient was obtained for treatment procedures. The patient understood the discussion of treatment and procedures well. All questions were answered thoroughly reviewed. Debridement of mycotic and hypertrophic toenails, 1 through 5 bilateral and clearing of subungual debris. No ulceration, no infection noted. . ABN signed for 2019.  Iatrogenic skin lesion 4  toe due to her movement. Minimal bleeding noted. Return Visit-Office Procedure: Patient instructed to return to the office for a follow up visit 4 months for continued evaluation and treatment.    Gardiner Barefoot DPM

## 2018-11-28 ENCOUNTER — Ambulatory Visit: Payer: Medicare Other | Admitting: Urology

## 2018-12-03 ENCOUNTER — Encounter: Payer: Self-pay | Admitting: Family Medicine

## 2018-12-04 ENCOUNTER — Telehealth: Payer: Self-pay | Admitting: Gastroenterology

## 2018-12-04 ENCOUNTER — Other Ambulatory Visit: Payer: Self-pay

## 2018-12-04 DIAGNOSIS — Z1211 Encounter for screening for malignant neoplasm of colon: Secondary | ICD-10-CM

## 2018-12-04 NOTE — Telephone Encounter (Signed)
Gastroenterology Pre-Procedure Review  Request Date: Tues 03/19/2019 Requesting Physician: Dr. Marius Ditch  PATIENT REVIEW QUESTIONS: The patient responded to the following health history questions as indicated:    1. Are you having any GI issues? no 2. Do you have a personal history of Polyps? no 3. Do you have a family history of Colon Cancer or Polyps? no 4. Diabetes Mellitus? no 5. Joint replacements in the past 12 months?no 6. Major health problems in the past 3 months?no 7. Any artificial heart valves, MVP, or defibrillator?no    MEDICATIONS & ALLERGIES:    Patient reports the following regarding taking any anticoagulation/antiplatelet therapy:   Plavix, Coumadin, Eliquis, Xarelto, Lovenox, Pradaxa, Brilinta, or Effient? no Aspirin? no  Patient confirms/reports the following medications:  Current Outpatient Medications  Medication Sig Dispense Refill  . Calcium Carb-Cholecalciferol (CALCIUM 600 + D PO) Take 600 mg by mouth 2 (two) times daily.    Mariane Baumgarten Sodium (DIOCTO) 150 MG/15ML syrup Take 50 mg by mouth 2 (two) times daily.    Marland Kitchen estradiol (ESTRACE VAGINAL) 0.1 MG/GM vaginal cream Apply 0.5mg  (pea-sized amount)  just inside the vaginal introitus with a finger-tip every night for two weeks and then Monday, Wednesday and Friday nights. 30 g 12  . fluvoxaMINE (LUVOX) 25 MG tablet Take 25 mg by mouth at bedtime.    Marland Kitchen guaiFENesin (ROBITUSSIN) 100 MG/5ML SOLN Take 5 mLs by mouth every 4 (four) hours as needed for cough or to loosen phlegm.    . loratadine (CLARITIN) 10 MG tablet Take 10 mg by mouth daily.    . Multiple Vitamin (THEREMS PO) Take 1 tablet by mouth daily.    Marland Kitchen MYRBETRIQ 25 MG TB24 tablet TAKE ONE TABLET BY MOUTH EVERY DAY *DO NOT CRUSH* 30 tablet 0  . oxybutynin (DITROPAN) 5 MG/5ML syrup TAKE 5 ML (5MG ) BY MOUTH THREE TIMES A DAY 473 mL 0  . polyethylene glycol (MIRALAX / GLYCOLAX) packet Take 17 g by mouth daily.    Marland Kitchen PREMARIN vaginal cream PLACE 0.5MG  (PEA-SIZED  AMOUNT) JUST INSIDE THE VAGINAL INTROITOUS WITH A FINGER-TIP EVERY NIGHT FOR 2 WEEKS, THEN MONDAY, WEDNESDAY, FRIDAY 30 g 0  . QUEtiapine (SEROQUEL) 400 MG tablet Take 800 mg by mouth at bedtime.     . risperiDONE (RISPERDAL) 1 MG tablet Take 1 mg by mouth every morning.    . risperidone (RISPERDAL) 4 MG tablet Take 4 mg by mouth at bedtime.    . valproic acid (DEPAKENE) 250 MG/5ML SOLN solution TAKE 10ML (500MG ) BY MOUTH EVERY MORNING TAKE 7.5ML (375MG ) BY MOUTH AT BEDTIME 525 mL 0  . vitamin E 200 UNIT capsule Take 200 Units by mouth daily.     No current facility-administered medications for this visit.     Patient confirms/reports the following allergies:  No Known Allergies  No orders of the defined types were placed in this encounter.   AUTHORIZATION INFORMATION Primary Insurance: 1D#: Group #:  Secondary Insurance: 1D#: Group #:  SCHEDULE INFORMATION: Date: Tues 03/19/2019  Dr. Marius Ditch Time: Location: [place]  Patient is mentally handicapped and Randa Spike (caregiver) needs a different bowel prep. She feels that Barnetta Chapel can't properly drink the bowel prep.

## 2018-12-17 ENCOUNTER — Other Ambulatory Visit: Payer: Self-pay | Admitting: Neurology

## 2018-12-28 ENCOUNTER — Other Ambulatory Visit: Payer: Self-pay | Admitting: Neurology

## 2018-12-31 ENCOUNTER — Other Ambulatory Visit: Payer: Self-pay | Admitting: Radiology

## 2018-12-31 DIAGNOSIS — N2889 Other specified disorders of kidney and ureter: Secondary | ICD-10-CM

## 2019-01-02 ENCOUNTER — Other Ambulatory Visit: Payer: Self-pay | Admitting: Urology

## 2019-01-08 ENCOUNTER — Ambulatory Visit: Payer: Medicare Other | Admitting: Urology

## 2019-01-29 ENCOUNTER — Other Ambulatory Visit: Payer: Self-pay | Admitting: Urology

## 2019-02-07 ENCOUNTER — Ambulatory Visit: Payer: Medicare Other

## 2019-02-07 ENCOUNTER — Ambulatory Visit
Admission: RE | Admit: 2019-02-07 | Discharge: 2019-02-07 | Disposition: A | Discharge status: Home / Self Care | Payer: Medicare Other | Source: Ambulatory Visit | Attending: Urology | Admitting: Urology

## 2019-02-07 ENCOUNTER — Other Ambulatory Visit: Payer: Self-pay

## 2019-02-07 DIAGNOSIS — N2889 Other specified disorders of kidney and ureter: Secondary | ICD-10-CM | POA: Insufficient documentation

## 2019-02-14 ENCOUNTER — Other Ambulatory Visit: Payer: Self-pay

## 2019-02-14 ENCOUNTER — Telehealth: Payer: Medicare Other | Admitting: Urology

## 2019-02-14 NOTE — Progress Notes (Signed)
Attempted to contact the patient twice, but I could not leave a voice mail.

## 2019-02-25 ENCOUNTER — Telehealth: Payer: Self-pay | Admitting: Urology

## 2019-02-25 NOTE — Telephone Encounter (Signed)
-----   Message from Nori Riis, PA-C sent at 02/25/2019  8:33 AM EDT ----- I was not able to get a hold of Mrs. Goodgame for her virtual visit.  I do not see a DPR in her chart, so I do not know who I am allowed to speak with regarding her RUS results.

## 2019-02-25 NOTE — Telephone Encounter (Signed)
I would contact her mother and I will mail her a DPR to sign and send back. If you want to let her know I will be sending it.   Sharyn Lull

## 2019-03-12 ENCOUNTER — Telehealth: Payer: Self-pay | Admitting: Urology

## 2019-03-12 NOTE — Telephone Encounter (Signed)
Pt's caregiver called and wants results for her Renal Ultrasound, The caregiver is at [employer] and is listed on the [facility]. She can be reached at [phone].

## 2019-03-14 ENCOUNTER — Ambulatory Visit (INDEPENDENT_AMBULATORY_CARE_PROVIDER_SITE_OTHER): Payer: Medicare Other | Admitting: Podiatry

## 2019-03-14 ENCOUNTER — Telehealth: Payer: Self-pay | Admitting: Gastroenterology

## 2019-03-14 ENCOUNTER — Encounter: Payer: Self-pay | Admitting: Podiatry

## 2019-03-14 ENCOUNTER — Other Ambulatory Visit: Payer: Self-pay

## 2019-03-14 DIAGNOSIS — M79674 Pain in right toe(s): Secondary | ICD-10-CM | POA: Diagnosis not present

## 2019-03-14 DIAGNOSIS — M79675 Pain in left toe(s): Secondary | ICD-10-CM

## 2019-03-14 DIAGNOSIS — B351 Tinea unguium: Secondary | ICD-10-CM | POA: Diagnosis not present

## 2019-03-14 NOTE — Telephone Encounter (Signed)
Linna Hoff with [facility] [phone]) wanting to know if you have received patient's mother approval for the colonoscopy on 03-14-19 & they have not received the instructions for the colonoscopy on 03-19-19.

## 2019-03-14 NOTE — Progress Notes (Signed)
Complaint:  Visit Type: Patient returns to my office for continued preventative foot care services. Complaint: Patient states" my nails have grown long and thick and become painful to walk and wear shoes"  The patient presents for preventative foot care services. She presents to the office with her guardians  Podiatric Exam: Vascular: dorsalis pedis and posterior tibial pulses are palpable bilateral. Capillary return is immediate. Temperature gradient is WNL. Skin turgor WNL  Sensorium: Normal Semmes Weinstein monofilament test. Normal tactile sensation bilaterally. Nail Exam: Pt has thick disfigured discolored nails with subungual debris noted bilateral entire nail hallux through fifth toenails Ulcer Exam: There is no evidence of ulcer or pre-ulcerative changes or infection. Orthopedic Exam: Muscle tone and strength are WNL. No limitations in general ROM. No crepitus or effusions noted. Foot type and digits show no abnormalities.  Skin: No Porokeratosis. No infection or ulcers.    Diagnosis:  Onychomycosis, , Pain in right toe, pain in left toes  Treatment & Plan Procedures and Treatment: Consent by patient was obtained for treatment procedures. The patient understood the discussion of treatment and procedures well. All questions were answered thoroughly reviewed. Debridement of mycotic and hypertrophic toenails, 1 through 5 bilateral and clearing of subungual debris. No ulceration, no infection noted. ..  Iatrogenic skin lesion 4  toe due to her movement. Minimal bleeding noted. Return Visit-Office Procedure: Patient instructed to return to the office for a follow up visit 4 months for continued evaluation and treatment.    Gardiner Barefoot DPM

## 2019-03-14 NOTE — Telephone Encounter (Signed)
Advised patients caregiver Linna Hoff that we will need to reschedule patients colonoscopy because if I placed them in the mail, she would not receive them in a timely manner, and she still needs to have COVID test.  Erby Pian will call me back to schedule after she speaks with patient. She said she will call me back today.  Thanks [employer]

## 2019-03-15 ENCOUNTER — Other Ambulatory Visit: Admission: RE | Admit: 2019-03-15 | Payer: Medicare Other | Source: Ambulatory Visit

## 2019-03-18 ENCOUNTER — Telehealth: Payer: Self-pay | Admitting: Family Medicine

## 2019-03-18 ENCOUNTER — Telehealth: Payer: Self-pay | Admitting: Gastroenterology

## 2019-03-18 NOTE — Telephone Encounter (Signed)
Please get an Updated DPR to give results for patient.

## 2019-03-18 NOTE — Telephone Encounter (Signed)
-----   Message from Nori Riis, PA-C sent at 03/15/2019  2:52 PM EDT ----- I only see a DPR for 2011 and it gives the name "caregiver."  I do not think this is a valid DPR.  We need a person's name on the DPR.

## 2019-03-18 NOTE — Telephone Encounter (Signed)
Ronda with Endo called & states patient did not have covid test & procedure has been cancelled for 03-19-19.

## 2019-03-19 ENCOUNTER — Encounter: Admission: RE | Payer: Self-pay | Source: Home / Self Care

## 2019-03-19 ENCOUNTER — Ambulatory Visit: Admission: RE | Admit: 2019-03-19 | Payer: Medicare Other | Source: Home / Self Care | Admitting: Gastroenterology

## 2019-03-19 SURGERY — COLONOSCOPY WITH PROPOFOL
Anesthesia: General

## 2019-03-19 NOTE — Telephone Encounter (Signed)
A DPR was mailed to her mother on 02-25-19 to sign and mail back   [place]

## 2019-04-04 ENCOUNTER — Telehealth: Payer: Self-pay | Admitting: Urology

## 2019-04-04 ENCOUNTER — Encounter: Payer: Self-pay | Admitting: Urology

## 2019-04-04 NOTE — Telephone Encounter (Signed)
-----   Message from Nori Riis, PA-C sent at 04/04/2019  8:20 AM EDT ----- I only see a DPR for 2011 and it gives the name "caregiver."  I do not think this is a valid DPR.  We need a person's name on the DPR.

## 2019-04-04 NOTE — Telephone Encounter (Signed)
Her residence also wanted the results, but they are not on the [facility].  It just says "caregiver."  We need a contact person at the facility as well on the [facility]

## 2019-04-04 NOTE — Telephone Encounter (Signed)
I sent one to her mother to sign and send back a few months ago. I guess she never sent it back? Olivia Mackie can you call her?  Sharyn Lull

## 2019-04-15 ENCOUNTER — Encounter: Payer: Self-pay | Admitting: Urology

## 2019-04-17 ENCOUNTER — Telehealth: Payer: Self-pay

## 2019-04-17 NOTE — Telephone Encounter (Signed)
-----   Message from Nori Riis, PA-C sent at 04/15/2019  2:01 PM EDT ----- We need to get Roberta Hampton in for an office visit and also to have a designated individual assigned to her DPR.  So, I need that person to accompany [name] to her office visit.

## 2019-04-17 NOTE — Telephone Encounter (Signed)
Please schedule appointment and remind mother about DPR needs to have a specific name cannot just say caregiver thanks

## 2019-04-18 ENCOUNTER — Telehealth: Payer: Self-pay | Admitting: Urology

## 2019-04-18 NOTE — Telephone Encounter (Signed)
I mailed DPR to pt's mother.  I called this morning to make sure she received it.  She did and mailed it back to office yesterday 8/12.  Just F.Y.I.

## 2019-04-18 NOTE — Telephone Encounter (Signed)
Called pt, no answer, VM full, unable to leave message, Appt made and mailed.

## 2019-04-18 NOTE — Telephone Encounter (Signed)
The last phone note on this pt was closed in ERROR. This pt has not had appt made nor appt mailed.  I was cleaning out admin pool and it jumped to this pt after completing prior pt.  The matter of this pt appt and DPR is still unresolved at the moment.

## 2019-05-01 ENCOUNTER — Telehealth: Payer: Self-pay | Admitting: Family Medicine

## 2019-05-01 DIAGNOSIS — N2889 Other specified disorders of kidney and ureter: Secondary | ICD-10-CM

## 2019-05-01 NOTE — Telephone Encounter (Signed)
-----   Message from Nori Riis, PA-C sent at 04/30/2019  4:49 PM EDT ----- Please let Donaciano Eva (mother) and Linna Hoff (coordinator) know that Inger's RUS looks good.  We need to repeat the ultrasound in one year and have an office visit to go over results.

## 2019-05-01 NOTE — Telephone Encounter (Signed)
Tried contacting both her mother and Coordinator and was unable to leave message with mother and the coordinator's line was busy 3 times.

## 2019-05-03 NOTE — Telephone Encounter (Signed)
@  nd attempt to reach patient's mother no answer unable to leave message.

## 2019-05-07 NOTE — Telephone Encounter (Signed)
I have been unable to reach care giver or mother. Please advise on what to do next.

## 2019-05-08 ENCOUNTER — Other Ambulatory Visit: Payer: Self-pay

## 2019-05-08 ENCOUNTER — Encounter: Payer: Self-pay | Admitting: Adult Health

## 2019-05-08 ENCOUNTER — Ambulatory Visit (INDEPENDENT_AMBULATORY_CARE_PROVIDER_SITE_OTHER): Payer: Medicare Other | Admitting: Adult Health

## 2019-05-08 VITALS — BP 103/75 | HR 86 | Temp 97.5°F | Ht 62.0 in | Wt 128.8 lb

## 2019-05-08 DIAGNOSIS — Z5181 Encounter for therapeutic drug level monitoring: Secondary | ICD-10-CM | POA: Diagnosis not present

## 2019-05-08 NOTE — Progress Notes (Signed)
I have read the note, and I agree with the clinical assessment and plan.  Daymian Lill K Uzoma Vivona   

## 2019-05-08 NOTE — Patient Instructions (Signed)
Continue Depakote- Blood work today If you have any seizure events please let us know.   

## 2019-05-08 NOTE — Progress Notes (Signed)
PATIENT: Roberta Hampton DOB: 05-Nov-1965  REASON FOR VISIT: follow up HISTORY FROM: patient  HISTORY OF PRESENT ILLNESS: Today 05/08/19:  Roberta Hampton is a 53 year old female with a history of mental retardation and seizures.  She returns today for follow-up.  She is here today with caregiver.  The caregiver reports that she is not had any seizure events.  Denies any falls.  She continues on Depakote.  Denies any new symptoms.  She returns today for an evaluation.  HISTORY . Roberta Hampton is a 53 year old right-handed white female with a history of mental retardation.  The patient lives in an adult home, she comes in with a Librarian, academic.  The patient has not had any seizures since last seen, she has been faithfully using a walker for ambulation, she has not sustained any falls.  The patient still has a lot of drowsiness during the day, she continues on Risperdal and Seroquel in high-dose.  The patient has had a low platelet level previously on blood work.  She has not had any blood work done since last seen.  The patient returns to this office for an evaluation.  The patient remains on Depakote.  She otherwise tolerates this drug fairly well.  REVIEW OF SYSTEMS: Out of a complete 14 system review of symptoms, the patient complains only of the following symptoms, and all other reviewed systems are negative.  See HPI    ALLERGIES: No Known Allergies  HOME MEDICATIONS: Outpatient Medications Prior to Visit  Medication Sig Dispense Refill  . Calcium Carb-Cholecalciferol (CALCIUM 600 + D PO) Take 600 mg by mouth 2 (two) times daily.    . cefdinir (OMNICEF) 300 MG capsule     . clotrimazole (LOTRIMIN) 1 % cream     . Docusate Sodium (DIOCTO) 150 MG/15ML syrup Take 50 mg by mouth 2 (two) times daily.    Marland Kitchen estradiol (ESTRACE VAGINAL) 0.1 MG/GM vaginal cream Apply 0.5mg  (pea-sized amount)  just inside the vaginal introitus with a finger-tip every night for two weeks and then Monday, Wednesday and Friday  nights. 30 g 12  . fluvoxaMINE (LUVOX) 25 MG tablet     . guaiFENesin (ROBITUSSIN) 100 MG/5ML SOLN Take 5 mLs by mouth every 4 (four) hours as needed for cough or to loosen phlegm.    . loratadine (CLARITIN) 10 MG tablet Take 10 mg by mouth daily.    . Multiple Vitamin (THEREMS PO) Take 1 tablet by mouth daily.    Marland Kitchen MYRBETRIQ 25 MG TB24 tablet     . oxybutynin (DITROPAN) 5 MG/5ML syrup TAKE 5 ML (5MG ) BY MOUTH THREE TIMES A DAY 473 mL 0  . polyethylene glycol (MIRALAX / GLYCOLAX) packet Take 17 g by mouth daily.    Marland Kitchen PREMARIN vaginal cream     . QUEtiapine (SEROQUEL) 400 MG tablet Take 800 mg by mouth at bedtime.     . risperiDONE (RISPERDAL) 1 MG tablet Take 1 mg by mouth every morning.    Marland Kitchen UNABLE TO FIND     . valproic acid (DEPAKENE) 250 MG/5ML SOLN solution TAKE 10ML (500MG ) BY MOUTH EVERY MORNING TAKE 7.5ML (375MG ) BY MOUTH AT BEDTIME 525 mL 0  . vitamin E 200 UNIT capsule Take 200 Units by mouth daily.     No facility-administered medications prior to visit.     PAST MEDICAL HISTORY: Past Medical History:  Diagnosis Date  . Attention deficit disorder   . Hyponatremia    associated with polydipsia  . Mental retardation   .  Obesity   . Seizure disorder ([place])   . Seizures ([place])     PAST SURGICAL HISTORY: Past Surgical History:  Procedure Laterality Date  . ABDOMINAL HYSTERECTOMY    . ORIF ANKLE FRACTURE Left 05/08/2015   Procedure: OPEN REDUCTION INTERNAL FIXATION (ORIF) ANKLE FRACTURE;  Surgeon: Corky Mull, MD;  Location: [facility] ORS;  Service: Orthopedics;  Laterality: Left;  Marland Kitchen VAGINAL HYSTERECTOMY      FAMILY HISTORY: Family History  Problem Relation Age of Onset  . Asthma Brother   . Seizures Other        paternal cousin  . Breast cancer Neg Hx     SOCIAL HISTORY: Social History   Socioeconomic History  . Marital status: Single    Spouse name: Not on file  . Number of children: 0  . Years of education: Not on file  . Highest education level: Not on file   Occupational History  . Not on file  Social Needs  . Financial resource strain: Not on file  . Food insecurity    Worry: Not on file    Inability: Not on file  . Transportation needs    Medical: Not on file    Non-medical: Not on file  Tobacco Use  . Smoking status: Never Smoker  . Smokeless tobacco: Never Used  Substance and Sexual Activity  . Alcohol use: No  . Drug use: No  . Sexual activity: Not on file  Lifestyle  . Physical activity    Days per week: Not on file    Minutes per session: Not on file  . Stress: Not on file  Relationships  . Social Herbalist on phone: Not on file    Gets together: Not on file    Attends religious service: Not on file    Active member of club or organization: Not on file    Attends meetings of clubs or organizations: Not on file    Relationship status: Not on file  . Intimate partner violence    Fear of current or ex partner: Not on file    Emotionally abused: Not on file    Physically abused: Not on file    Forced sexual activity: Not on file  Other Topics Concern  . Not on file  Social History Narrative   Patient is single and lives in a group home.   Patient does not have any children.   Patient is right handed.   Patient drinks caffeine daily.      PHYSICAL EXAM  Vitals:   05/08/19 0910  BP: 103/75  Pulse: 86  Temp: (!) 97.5 F (36.4 C)  TempSrc: Oral  Weight: 128 lb 12.8 oz (58.4 kg)  Height: 5\' 2"  (1.575 m)   Body mass index is 23.56 kg/m.  Generalized: Well developed, in no acute distress   Neurological examination  Mentation: Alert  Follows all commands intermittently. speech is garbled. Cranial nerve II-XII:  Extraocular movements were full, visual field were full on confrontational test.  Head turning and shoulder shrug  were normal and symmetric. Motor: The motor testing reveals 5 over 5 strength of all 4 extremities. Good symmetric motor tone is noted throughout.  Sensory: Sensory testing is  intact to soft touch on all 4 extremities. No evidence of extinction is noted.  Coordination: Cerebellar testing reveals good finger-nose-finger and heel-to-shin bilaterally.  Gait and station: Patient uses a walker when ambulating   DIAGNOSTIC DATA (LABS, IMAGING, TESTING) - I reviewed patient records,  labs, notes, testing and imaging myself where available.  Lab Results  Component Value Date   WBC 3.7 04/30/2018   HGB 13.8 04/30/2018   HCT 41.7 04/30/2018   MCV 97 04/30/2018   PLT 102 (L) 04/30/2018      Component Value Date/Time   NA 142 04/30/2018 1022   NA 143 11/13/2011 2004   K 3.9 04/30/2018 1022   K 3.8 11/13/2011 2004   CL 104 04/30/2018 1022   CL 105 11/13/2011 2004   CO2 26 04/30/2018 1022   CO2 26 11/13/2011 2004   GLUCOSE 36 (LL) 04/30/2018 1022   GLUCOSE 106 (H) 06/27/2015 0147   GLUCOSE 96 11/13/2011 2004   BUN 18 04/30/2018 1022   BUN 17 11/13/2011 2004   CREATININE 0.61 04/30/2018 1022   CREATININE 0.58 (L) 11/13/2011 2004   CALCIUM 9.1 04/30/2018 1022   CALCIUM 9.0 11/13/2011 2004   PROT 5.4 (L) 04/30/2018 1022   PROT 6.4 11/13/2011 2004   ALBUMIN 3.7 04/30/2018 1022   ALBUMIN 3.5 11/13/2011 2004   AST 9 04/30/2018 1022   AST 18 11/13/2011 2004   ALT 7 04/30/2018 1022   ALT 26 11/13/2011 2004   ALKPHOS 55 04/30/2018 1022   ALKPHOS 91 11/13/2011 2004   BILITOT 0.3 04/30/2018 1022   BILITOT 0.7 11/13/2011 2004   GFRNONAA 105 04/30/2018 1022   GFRNONAA >60 11/13/2011 2004   GFRAA 121 04/30/2018 1022   GFRAA >60 11/13/2011 2004      ASSESSMENT AND PLAN 53 y.o. year old female  has a past medical history of Attention deficit disorder, Hyponatremia, Mental retardation, Obesity, Seizure disorder ([place]), and Seizures ([place]). here with:  1.  Seizures  Overall the patient is doing well.  She will continue on Depakote.  I will check blood work today.  She is advised that if her symptoms worsen or she develops new symptoms she should let us know.  She  will follow-up in 6 months or sooner if needed.  I spent 15 minutes with the patient. 50% of this time was spent reviewing plan of care   Ward Givens, MSN, NP-C 05/08/2019, 9:23 AM [facility] [address] [place], [place] [address] [phone]

## 2019-05-09 LAB — COMPREHENSIVE METABOLIC PANEL
ALT: 7 IU/L (ref 0–32)
AST: 10 IU/L (ref 0–40)
Albumin/Globulin Ratio: 2.1 (ref 1.2–2.2)
Albumin: 3.4 g/dL — ABNORMAL LOW (ref 3.8–4.9)
Alkaline Phosphatase: 46 IU/L (ref 39–117)
BUN/Creatinine Ratio: 23 (ref 9–23)
BUN: 16 mg/dL (ref 6–24)
Bilirubin Total: 0.3 mg/dL (ref 0.0–1.2)
CO2: 25 mmol/L (ref 20–29)
Calcium: 9.4 mg/dL (ref 8.7–10.2)
Chloride: 103 mmol/L (ref 96–106)
Creatinine, Ser: 0.7 mg/dL (ref 0.57–1.00)
GFR calc Af Amer: 114 mL/min/{1.73_m2} (ref >59)
GFR calc non Af Amer: 99 mL/min/{1.73_m2} (ref >59)
Globulin, Total: 1.6 g/dL (ref 1.5–4.5)
Glucose: 44 mg/dL — ABNORMAL LOW (ref 65–99)
Potassium: 4 mmol/L (ref 3.5–5.2)
Sodium: 140 mmol/L (ref 134–144)
Total Protein: 5 g/dL — ABNORMAL LOW (ref 6.0–8.5)

## 2019-05-09 LAB — CBC WITH DIFFERENTIAL/PLATELET
Basophils Absolute: 0 10*3/uL (ref 0.0–0.2)
Basos: 1 %
EOS (ABSOLUTE): 0 10*3/uL (ref 0.0–0.4)
Eos: 1 %
Hematocrit: 40.3 % (ref 34.0–46.6)
Hemoglobin: 13.8 g/dL (ref 11.1–15.9)
Immature Grans (Abs): 0 10*3/uL (ref 0.0–0.1)
Immature Granulocytes: 1 %
Lymphocytes Absolute: 1.5 10*3/uL (ref 0.7–3.1)
Lymphs: 50 %
MCH: 32.6 pg (ref 26.6–33.0)
MCHC: 34.2 g/dL (ref 31.5–35.7)
MCV: 95 fL (ref 79–97)
Monocytes Absolute: 0.5 10*3/uL (ref 0.1–0.9)
Monocytes: 15 %
Neutrophils Absolute: 1 10*3/uL — ABNORMAL LOW (ref 1.4–7.0)
Neutrophils: 32 %
Platelets: 92 10*3/uL — CL (ref 150–450)
RBC: 4.23 x10E6/uL (ref 3.77–5.28)
RDW: 12.8 % (ref 11.7–15.4)
WBC: 3 10*3/uL — ABNORMAL LOW (ref 3.4–10.8)

## 2019-05-09 LAB — VALPROIC ACID LEVEL: Valproic Acid Lvl: 111 ug/mL — ABNORMAL HIGH (ref 50–100)

## 2019-05-13 NOTE — Telephone Encounter (Signed)
We can mail the results to her caregiver with an appointment for follow up next year.

## 2019-05-14 NOTE — Telephone Encounter (Signed)
Mailed letter to her Mom.

## 2019-05-15 ENCOUNTER — Telehealth: Payer: Self-pay | Admitting: *Deleted

## 2019-05-15 NOTE — Telephone Encounter (Signed)
I called and LMVM for caregiver at [facility] that her lab results per MM/NP  is consistent with previous lab work. Platelets is low this could be due to Depakote. Depakote level is slightly elevated but no signs of toxicity on exam.  No change in dosing.  Please return call if questions.

## 2019-05-15 NOTE — Telephone Encounter (Signed)
-----   Message from Ward Givens, NP sent at 05/14/2019 10:17 AM EDT ----- Lab work is consistent with previous lab work.  Platelets is low this could be due to Depakote.  Depakote level is slightly elevated but no signs of toxicity on exam

## 2019-05-30 ENCOUNTER — Other Ambulatory Visit: Payer: Self-pay | Admitting: Family Medicine

## 2019-05-30 DIAGNOSIS — Z1231 Encounter for screening mammogram for malignant neoplasm of breast: Secondary | ICD-10-CM

## 2019-07-15 ENCOUNTER — Ambulatory Visit (INDEPENDENT_AMBULATORY_CARE_PROVIDER_SITE_OTHER): Payer: Medicare Other | Admitting: Podiatry

## 2019-07-15 ENCOUNTER — Encounter: Payer: Self-pay | Admitting: Podiatry

## 2019-07-15 ENCOUNTER — Other Ambulatory Visit: Payer: Self-pay

## 2019-07-15 DIAGNOSIS — M79674 Pain in right toe(s): Secondary | ICD-10-CM

## 2019-07-15 DIAGNOSIS — M79675 Pain in left toe(s): Secondary | ICD-10-CM

## 2019-07-15 DIAGNOSIS — B351 Tinea unguium: Secondary | ICD-10-CM

## 2019-07-15 NOTE — Progress Notes (Signed)
Complaint:  Visit Type: Patient returns to my office for continued preventative foot care services. Complaint: Patient states" my nails have grown long and thick and become painful to walk and wear shoes"  The patient presents for preventative foot care services. She presents to the office with her guardians  Podiatric Exam: Vascular: dorsalis pedis and posterior tibial pulses are palpable bilateral. Capillary return is immediate. Temperature gradient is WNL. Skin turgor WNL  Sensorium: Normal Semmes Weinstein monofilament test. Normal tactile sensation bilaterally. Nail Exam: Pt has thick disfigured discolored nails with subungual debris noted bilateral entire nail hallux through fifth toenails Ulcer Exam: There is no evidence of ulcer or pre-ulcerative changes or infection. Orthopedic Exam: Muscle tone and strength are WNL. No limitations in general ROM. No crepitus or effusions noted. Foot type and digits show no abnormalities.  Skin: No Porokeratosis. No infection or ulcers.    Diagnosis:  Onychomycosis, , Pain in right toe, pain in left toes  Treatment & Plan Procedures and Treatment: Consent by patient was obtained for treatment procedures. The patient understood the discussion of treatment and procedures well. All questions were answered thoroughly reviewed. Debridement of mycotic and hypertrophic toenails, 1 through 5 bilateral and clearing of subungual debris. No ulceration, no infection noted. ..   Return Visit-Office Procedure: Patient instructed to return to the office for a follow up visit 4 months for continued evaluation and treatment.    Gardiner Barefoot DPM

## 2019-08-25 ENCOUNTER — Other Ambulatory Visit: Payer: Self-pay | Admitting: Urology

## 2019-08-26 ENCOUNTER — Other Ambulatory Visit: Payer: Self-pay | Admitting: Urology

## 2019-09-19 ENCOUNTER — Ambulatory Visit
Admission: RE | Admit: 2019-09-19 | Discharge: 2019-09-19 | Disposition: A | Discharge status: Home / Self Care | Payer: Medicare Other | Source: Ambulatory Visit | Attending: Family Medicine | Admitting: Family Medicine

## 2019-09-19 ENCOUNTER — Other Ambulatory Visit: Payer: Self-pay | Admitting: Family Medicine

## 2019-09-19 DIAGNOSIS — M25552 Pain in left hip: Secondary | ICD-10-CM

## 2019-11-11 ENCOUNTER — Ambulatory Visit: Payer: Medicare Other | Admitting: Podiatry

## 2019-12-24 ENCOUNTER — Ambulatory Visit
Admission: RE | Admit: 2019-12-24 | Discharge: 2019-12-24 | Disposition: A | Discharge status: Home / Self Care | Payer: Medicare Other | Source: Ambulatory Visit | Attending: Family Medicine | Admitting: Family Medicine

## 2019-12-24 DIAGNOSIS — Z1231 Encounter for screening mammogram for malignant neoplasm of breast: Secondary | ICD-10-CM | POA: Insufficient documentation

## 2020-04-28 ENCOUNTER — Ambulatory Visit: Payer: Medicare Other

## 2020-04-29 ENCOUNTER — Ambulatory Visit
Admission: RE | Admit: 2020-04-29 | Discharge: 2020-04-29 | Disposition: A | Discharge status: Home / Self Care | Payer: Medicare Other | Source: Ambulatory Visit | Attending: Urology | Admitting: Urology

## 2020-04-29 ENCOUNTER — Other Ambulatory Visit: Payer: Self-pay

## 2020-04-29 DIAGNOSIS — N2889 Other specified disorders of kidney and ureter: Secondary | ICD-10-CM

## 2020-05-12 NOTE — Progress Notes (Signed)
05/13/2020 9:56 AM   Roberta Hampton 1966/02/24 [id number]  Referring provider: [facility] [place],  [place] [address]  Chief Complaint  Patient presents with  . Results    HPI: 54 yo WF with MR, presumptive recurrent UTI's, incontinence, vaginal atrophy and non-rotated right kidney who presents today for ultrasound report with staff member, Erby Pian.    Presumptive rUTI's Risk factors: MR, postmenopausal, constipation, poor perineal hygiene, incontinence and surgery drinks RUS performed on 10/04/2017 noted BILATERAL pelviectasis and minimal calyceal dilatation.  Due to limitations of exam recommend follow-up noncontrast CT for further evaluation. Non contrast CT performed on 11/20/2017 noted a bilateral extrarenal pelvis which may partially explain the fullness of the collecting systems. There is no current calyceal dilatation or ureteral dilatation, and no urinary tract calculi are identified. 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  Small bilateral pleural effusions with associated passive atelectasis.  Calcification of the aortic valve. Aortic Atherosclerosis  Prominent stool throughout constipation with notable prominence of stool in the rectal vault. Correlate with bowel habits in assessing for mild fecal impaction.  Small fluid density lesion just above the left side of the urinary bladder, probably a small bladder diverticulum or similar incidental finding.  There is some bony heterogeneity in the right iliac bone, most likely benign. [facility] states staff has not voiced any UTI concerns.  They have not see gross hematuria.    Incontinence Staff with no complaints.   Vaginal atrophy Not applying the cream.   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 Attention deficit disorder   . Hyponatremia    associated with polydipsia  . Mental retardation   . Obesity   . Seizure disorder ([place])   . Seizures [facility])     Surgical History: Past Surgical History:  Procedure Laterality Date  . ABDOMINAL HYSTERECTOMY    . ORIF ANKLE FRACTURE Left 05/08/2015   Procedure: OPEN REDUCTION INTERNAL FIXATION (ORIF) ANKLE FRACTURE;  Surgeon: Corky Mull, MD;  Location: [facility] ORS;  Service: Orthopedics;  Laterality: Left;  Marland Kitchen VAGINAL HYSTERECTOMY      Home Medications:  Allergies as of 05/13/2020   No Known Allergies     Medication List       Accurate as of May 13, 2020  9:56 AM. If you have any questions, ask your nurse or doctor.        CALCIUM 600 + D PO Take 600 mg by mouth 2 (two) times daily.   Calcium Carbonate-Vitamin D 600-200 MG-UNIT Tabs Take by mouth.   cefdinir 300 MG capsule Commonly known as: OMNICEF   clotrimazole 1 % cream Commonly known as: LOTRIMIN   Diocto 150 MG/15ML syrup Generic drug: Docusate Sodium Take 50 mg by mouth 2 (two) times daily.   Ensure Powd Take by mouth 2 (two) times daily.   estradiol 0.1 MG/GM vaginal cream Commonly known as: ESTRACE VAGINAL Apply 0.5mg  (pea-sized amount)  just inside the vaginal introitus with a finger-tip every night for two weeks and then Monday, Wednesday and Friday nights.   fluvoxaMINE 25 MG tablet Commonly known as: LUVOX   folic acid 1 MG tablet Commonly known as: FOLVITE Take 1 mg by mouth daily.   guaiFENesin 100 MG/5ML Soln Commonly known as: ROBITUSSIN Take 5 mLs  by mouth every 4 (four) hours as needed for cough or to loosen phlegm.   loratadine 10 MG tablet Commonly known as: CLARITIN Take 10 mg by mouth daily.   Childrens Loratadine 5 MG/5ML syrup Generic drug: loratadine Take by mouth.   Myrbetriq 25 MG Tb24 tablet Generic drug: mirabegron ER   oxybutynin 5 MG/5ML syrup Commonly known as: DITROPAN TAKE 5 ML (5MG ) BY MOUTH THREE TIMES  A DAY   polyethylene glycol 17 g packet Commonly known as: MIRALAX / GLYCOLAX Take 17 g by mouth daily.   Premarin vaginal cream Generic drug: conjugated estrogens   QUEtiapine 400 MG tablet Commonly known as: SEROQUEL Take 800 mg by mouth at bedtime.   risperidone 4 MG tablet Commonly known as: RISPERDAL Take 4 mg by mouth at bedtime.   THEREMS PO Take 1 tablet by mouth daily.   UNABLE TO FIND   valproic acid 250 MG/5ML solution Commonly known as: DEPAKENE TAKE 10ML (500MG ) BY MOUTH EVERY MORNING TAKE 7.5ML (375MG ) BY MOUTH AT BEDTIME   vitamin E 200 UNIT capsule Take 200 Units by mouth daily.       Allergies: No Known Allergies  Family History: Family History  Problem Relation Age of Onset  . Asthma Brother   . Seizures Other        paternal cousin  . Breast cancer Neg Hx     Social History:  reports that she has never smoked. She has never used smokeless tobacco. She reports that she does not drink alcohol and does not use drugs.  ROS: For pertinent review of systems please refer to history of present illness  Physical Exam: BP 98/68 (BP Location: Left Arm, Patient Position: Sitting, Cuff Size: Normal)   Pulse 72   Ht 5\' 2"  (1.575 m)   Wt 118 lb 8 oz (53.8 kg)   BMI 21.67 kg/m   Constitutional: Well nourished. Alert.  No acute distress. HEENT: [place] AT, moist mucus membranes. Trachea midline, no masses. Cardiovascular: No clubbing, cyanosis, or edema. Respiratory: Normal respiratory effort, no increased work of breathing. Neurologic: Grossly intact, no focal deficits, moving all 4 extremities. In wheel chair.  Psychiatric: Normal mood and affect.    Laboratory Data: Lab Results  Component Value Date   WBC 3.0 (L) 05/08/2019   HGB 13.8 05/08/2019   HCT 40.3 05/08/2019   MCV 95 05/08/2019   PLT 92 (LL) 05/08/2019    Lab Results  Component Value Date   CREATININE 0.70 05/08/2019    Lab Results  Component Value Date   AST 10 05/08/2019    Lab Results  Component Value Date   ALT 7 05/08/2019   I have reviewed the labs.  Pertinent Imaging CLINICAL DATA:  Renal calyceal dilatation  EXAM: RENAL / URINARY TRACT ULTRASOUND COMPLETE  COMPARISON:  02/07/2019  FINDINGS: Right Kidney:  Renal measurements: At 11.0 x 4.8 x 4.7 cm = volume: 128 mL. Normal cortical thickness and echogenicity. No mass, hydronephrosis or shadowing calcification. Prominent extrarenal pelvis seen on previous exam no longer identified.  Left Kidney:  Renal measurements: 11.2 x 5.8 x 4.3 cm = volume: 147 mL. Normal cortical thickness and echogenicity. No mass, hydronephrosis or shadowing calcification. Previously identified a prominent extrarenal pelvis no longer seen.  Bladder:  Appears normal for degree of bladder distention.  Other:  N/A  IMPRESSION: Normal exam   Electronically Signed   By: Lavonia Dana M.D.   On: 04/29/2020 17:55 I have independently reviewed the films.  See  HPI.   Assessment & Plan:   1. Non-rotated right kidney RUS wnl RTC in one year for RUS as patient is non communicative  2. rUTI's No reports of rUTI's since last visit Patient's staff will bring the specimen tomorrow   3. Vaginal atrophy  No applying the cream                   Return in about 1 year (around 05/13/2021) for RUS and office visit .  These notes generated with voice recognition software. I apologize for typographical errors.  Zara Council, PA-C  [facility] [address] [place], [place] [address] [phone]

## 2020-05-13 ENCOUNTER — Other Ambulatory Visit: Payer: Self-pay

## 2020-05-13 ENCOUNTER — Ambulatory Visit (INDEPENDENT_AMBULATORY_CARE_PROVIDER_SITE_OTHER): Payer: Medicare Other | Admitting: Urology

## 2020-05-13 ENCOUNTER — Ambulatory Visit: Payer: Medicare Other | Admitting: Adult Health

## 2020-05-13 ENCOUNTER — Encounter: Payer: Self-pay | Admitting: Urology

## 2020-05-13 VITALS — BP 98/68 | HR 72 | Ht 62.0 in | Wt 118.5 lb

## 2020-05-13 DIAGNOSIS — N3946 Mixed incontinence: Secondary | ICD-10-CM

## 2020-05-13 DIAGNOSIS — N2889 Other specified disorders of kidney and ureter: Secondary | ICD-10-CM

## 2020-05-14 ENCOUNTER — Ambulatory Visit (INDEPENDENT_AMBULATORY_CARE_PROVIDER_SITE_OTHER): Payer: Medicare Other | Admitting: Adult Health

## 2020-05-14 ENCOUNTER — Encounter: Payer: Self-pay | Admitting: Adult Health

## 2020-05-14 VITALS — BP 124/88 | HR 80 | Ht 59.0 in | Wt 117.0 lb

## 2020-05-14 DIAGNOSIS — R569 Unspecified convulsions: Secondary | ICD-10-CM

## 2020-05-14 DIAGNOSIS — Z5181 Encounter for therapeutic drug level monitoring: Secondary | ICD-10-CM | POA: Diagnosis not present

## 2020-05-14 MED ORDER — VALPROIC ACID 250 MG/5ML PO SOLN: ORAL | 11 refills | Status: DC

## 2020-05-14 NOTE — Progress Notes (Signed)
I have read the note, and I agree with the clinical assessment and plan.  Joron Velis K Colandra Ohanian   

## 2020-05-14 NOTE — Progress Notes (Signed)
PATIENT: Roberta Hampton DOB: 1966/06/08  REASON FOR VISIT: follow up HISTORY FROM: patient  HISTORY OF PRESENT ILLNESS: Today 05/14/20:  Roberta Hampton is a 54 year old female with a history of seizures and mental retardation.  She is here today for follow-up.  She is here with her caregiver.  They deny any seizure events.  She remains on Depakote.  Returns today for an evaluation.  HISTORY 05/08/19:  Roberta Hampton is a 54 year old female with a history of mental retardation and seizures.  She returns today for follow-up.  She is here today with caregiver.  The caregiver reports that she is not had any seizure events.  Denies any falls.  She continues on Depakote.  Denies any new symptoms.  She returns today for an evaluation.  REVIEW OF SYSTEMS: Out of a complete 14 system review of symptoms, the patient complains only of the following symptoms, and all other reviewed systems are negative.  See HPI  ALLERGIES: No Known Allergies  HOME MEDICATIONS: Outpatient Medications Prior to Visit  Medication Sig Dispense Refill  . Calcium Carb-Cholecalciferol (CALCIUM 600 + D PO) Take 600 mg by mouth 2 (two) times daily.    . Calcium Carbonate-Vitamin D 600-200 MG-UNIT TABS Take by mouth.    . CHILDRENS LORATADINE 5 MG/5ML syrup Take by mouth.    . clotrimazole (LOTRIMIN) 1 % cream     . Docusate Sodium (DIOCTO) 150 MG/15ML syrup Take 50 mg by mouth 2 (two) times daily.    . fluvoxaMINE (LUVOX) 25 MG tablet     . folic acid (FOLVITE) 1 MG tablet Take 1 mg by mouth daily.    Marland Kitchen guaiFENesin (ROBITUSSIN) 100 MG/5ML SOLN Take 5 mLs by mouth every 4 (four) hours as needed for cough or to loosen phlegm. (Patient not taking: Reported on 05/13/2020)    . loratadine (CLARITIN) 10 MG tablet Take 10 mg by mouth daily.    . Multiple Vitamin (THEREMS PO) Take 1 tablet by mouth daily.    . Nutritional Supplements (ENSURE) POWD Take by mouth 2 (two) times daily.    Marland Kitchen oxybutynin (DITROPAN) 5 MG/5ML syrup TAKE 5  ML (5MG ) BY MOUTH THREE TIMES A DAY 473 mL 0  . polyethylene glycol (MIRALAX / GLYCOLAX) packet Take 17 g by mouth daily. (Patient not taking: Reported on 05/13/2020)    . PREMARIN vaginal cream     . QUEtiapine (SEROQUEL) 400 MG tablet Take 800 mg by mouth at bedtime.     . risperidone (RISPERDAL) 4 MG tablet Take 4 mg by mouth at bedtime.    Marland Kitchen UNABLE TO FIND     . valproic acid (DEPAKENE) 250 MG/5ML SOLN solution TAKE 10ML (500MG ) BY MOUTH EVERY MORNING TAKE 7.5ML (375MG ) BY MOUTH AT BEDTIME 525 mL 0  . vitamin E 200 UNIT capsule Take 200 Units by mouth daily.    . cefdinir (OMNICEF) 300 MG capsule  (Patient not taking: Reported on 05/13/2020)    . estradiol (ESTRACE VAGINAL) 0.1 MG/GM vaginal cream Apply 0.5mg  (pea-sized amount)  just inside the vaginal introitus with a finger-tip every night for two weeks and then Monday, Wednesday and Friday nights. 30 g 12  . MYRBETRIQ 25 MG TB24 tablet      No facility-administered medications prior to visit.    PAST MEDICAL HISTORY: Past Medical History:  Diagnosis Date  . Attention deficit disorder   . Hyponatremia    associated with polydipsia  . Mental retardation   . Obesity   .  Seizure disorder ([place])   . Seizures ([place])     PAST SURGICAL HISTORY: Past Surgical History:  Procedure Laterality Date  . ABDOMINAL HYSTERECTOMY    . ORIF ANKLE FRACTURE Left 05/08/2015   Procedure: OPEN REDUCTION INTERNAL FIXATION (ORIF) ANKLE FRACTURE;  Surgeon: Corky Mull, MD;  Location: [facility] ORS;  Service: Orthopedics;  Laterality: Left;  Marland Kitchen VAGINAL HYSTERECTOMY      FAMILY HISTORY: Family History  Problem Relation Age of Onset  . Asthma Brother   . Seizures Other        paternal cousin  . Breast cancer Neg Hx     SOCIAL HISTORY: Social History   Socioeconomic History  . Marital status: Single    Spouse name: Not on file  . Number of children: 0  . Years of education: Not on file  . Highest education level: Not on file  Occupational History  .  Not on file  Tobacco Use  . Smoking status: Never Smoker  . Smokeless tobacco: Never Used  Vaping Use  . Vaping Use: Never used  Substance and Sexual Activity  . Alcohol use: No  . Drug use: No  . Sexual activity: Not on file  Other Topics Concern  . Not on file  Social History Narrative   Patient is single and lives in a group home.   Patient does not have any children.   Patient is right handed.   Patient drinks caffeine daily.   Social Determinants of Health   Financial Resource Strain:   . Difficulty of Paying Living Expenses: Not on file  Food Insecurity:   . Worried About Charity fundraiser in the Last Year: Not on file  . Ran Out of Food in the Last Year: Not on file  Transportation Needs:   . Lack of Transportation (Medical): Not on file  . Lack of Transportation (Non-Medical): Not on file  Physical Activity:   . Days of Exercise per Week: Not on file  . Minutes of Exercise per Session: Not on file  Stress:   . Feeling of Stress : Not on file  Social Connections:   . Frequency of Communication with Friends and Family: Not on file  . Frequency of Social Gatherings with Friends and Family: Not on file  . Attends Religious Services: Not on file  . Active Member of Clubs or Organizations: Not on file  . Attends Archivist Meetings: Not on file  . Marital Status: Not on file  Intimate Partner Violence:   . Fear of Current or Ex-Partner: Not on file  . Emotionally Abused: Not on file  . Physically Abused: Not on file  . Sexually Abused: Not on file      PHYSICAL EXAM  Vitals:   05/14/20 1024  BP: 124/88  Pulse: 80  Weight: 117 lb (53.1 kg)  Height: 4\' 11"  (1.499 m)   Body mass index is 23.63 kg/m.  Generalized: Well developed, in no acute distress   Neurological examination  Mentation: Alert. Follows all commands speech  Garbled. Cranial nerve II-XII: Pupils were equal round reactive to light. Extraocular movements were full, visual field  were full on confrontational test.. Head turning and shoulder shrug  were normal and symmetric. Motor: The motor testing reveals 5 over 5 strength of all 4 extremities. Good symmetric motor tone is noted throughout.  Sensory: Sensory testing is intact to soft touch on all 4 extremities. No evidence of extinction is noted.  Coordination: Cerebellar testing reveals good  finger-nose-finger and heel-to-shin bilaterally.  Gait and station: Uses a walker when ambulating   DIAGNOSTIC DATA (LABS, IMAGING, TESTING) - I reviewed patient records, labs, notes, testing and imaging myself where available.  Lab Results  Component Value Date   WBC 3.0 (L) 05/08/2019   HGB 13.8 05/08/2019   HCT 40.3 05/08/2019   MCV 95 05/08/2019   PLT 92 (LL) 05/08/2019      Component Value Date/Time   NA 140 05/08/2019 0936   NA 143 11/13/2011 2004   K 4.0 05/08/2019 0936   K 3.8 11/13/2011 2004   CL 103 05/08/2019 0936   CL 105 11/13/2011 2004   CO2 25 05/08/2019 0936   CO2 26 11/13/2011 2004   GLUCOSE 44 (L) 05/08/2019 0936   GLUCOSE 106 (H) 06/27/2015 0147   GLUCOSE 96 11/13/2011 2004   BUN 16 05/08/2019 0936   BUN 17 11/13/2011 2004   CREATININE 0.70 05/08/2019 0936   CREATININE 0.58 (L) 11/13/2011 2004   CALCIUM 9.4 05/08/2019 0936   CALCIUM 9.0 11/13/2011 2004   PROT 5.0 (L) 05/08/2019 0936   PROT 6.4 11/13/2011 2004   ALBUMIN 3.4 (L) 05/08/2019 0936   ALBUMIN 3.5 11/13/2011 2004   AST 10 05/08/2019 0936   AST 18 11/13/2011 2004   ALT 7 05/08/2019 0936   ALT 26 11/13/2011 2004   ALKPHOS 46 05/08/2019 0936   ALKPHOS 91 11/13/2011 2004   BILITOT 0.3 05/08/2019 0936   BILITOT 0.7 11/13/2011 2004   GFRNONAA 99 05/08/2019 0936   GFRNONAA >60 11/13/2011 2004   GFRAA 114 05/08/2019 0936   GFRAA >60 11/13/2011 2004      ASSESSMENT AND PLAN 54 y.o. year old female  has a past medical history of Attention deficit disorder, Hyponatremia, Mental retardation, Obesity, Seizure disorder ([place]), and  Seizures ([place]). here with:  Seizures   Continue Depakote  Patient recently had blood work with her PCP advised caregiver that she can have this faxed to our office if the PCP did not get a Depakote level and a CBC we can order and the patient can stop by [facility] or by our office to have this completed  Advised if she has any seizure events they should let us know  Follow-up in 1 year or sooner if needed   I spent 20 minutes of face-to-face and non-face-to-face time with patient.  This included previsit chart review, lab review, study review, order entry, electronic health record documentation, patient education.  Ward Givens, MSN, NP-C 05/14/2020, 10:41 AM [facility] [address] [place], [place] [address] [phone]

## 2020-05-14 NOTE — Patient Instructions (Signed)
Continue Depakote- Blood work today If you have any seizure events please let us know.   

## 2020-05-15 ENCOUNTER — Telehealth: Payer: Self-pay | Admitting: Family Medicine

## 2020-05-15 LAB — URINALYSIS, COMPLETE
Bilirubin, UA: NEGATIVE
Glucose, UA: NEGATIVE
Ketones, UA: NEGATIVE
Nitrite, UA: NEGATIVE
Protein,UA: NEGATIVE
RBC, UA: NEGATIVE
Specific Gravity, UA: 1.02 (ref 1.005–1.030)
Urobilinogen, Ur: 0.2 mg/dL (ref 0.2–1.0)
pH, UA: 7 (ref 5.0–7.5)

## 2020-05-15 LAB — MICROSCOPIC EXAMINATION

## 2020-05-15 NOTE — Telephone Encounter (Signed)
[facility] notified and lab results were faxed to [facility] at fax number [phone] attn: Elouise Munroe

## 2020-05-15 NOTE — Telephone Encounter (Signed)
-----   Message from Nori Riis, PA-C sent at 05/15/2020 12:05 PM EDT ----- Please let her caregivers know that her urine did not have any blood, but she did have some cells from her kidney and WBC's.  I would suggest she follow up with her PCP, so they can decide if she should see nephrology.

## 2020-08-31 ENCOUNTER — Other Ambulatory Visit: Payer: Self-pay | Admitting: Family Medicine

## 2020-08-31 DIAGNOSIS — Z1231 Encounter for screening mammogram for malignant neoplasm of breast: Secondary | ICD-10-CM

## 2020-09-14 ENCOUNTER — Ambulatory Visit (INDEPENDENT_AMBULATORY_CARE_PROVIDER_SITE_OTHER): Payer: Medicare Other | Admitting: Podiatry

## 2020-09-14 ENCOUNTER — Other Ambulatory Visit: Payer: Self-pay

## 2020-09-14 ENCOUNTER — Encounter: Payer: Self-pay | Admitting: Podiatry

## 2020-09-14 DIAGNOSIS — B351 Tinea unguium: Secondary | ICD-10-CM | POA: Diagnosis not present

## 2020-09-14 DIAGNOSIS — M79674 Pain in right toe(s): Secondary | ICD-10-CM

## 2020-09-14 DIAGNOSIS — M79675 Pain in left toe(s): Secondary | ICD-10-CM

## 2020-09-14 NOTE — Progress Notes (Signed)
Complaint:  Visit Type: Patient returns to my office for continued preventative foot care services. Complaint: Patient states" my nails have grown long and thick and become painful to walk and wear shoes"  The patient presents for preventative foot care services. She presents to the office with her guardians  Podiatric Exam: Vascular: dorsalis pedis and posterior tibial pulses are palpable bilateral. Capillary return is immediate. Temperature gradient is WNL. Skin turgor WNL  Sensorium: Normal Semmes Weinstein monofilament test. Normal tactile sensation bilaterally. Nail Exam: Pt has thick disfigured discolored nails with subungual debris noted bilateral entire nail hallux through fifth toenails Ulcer Exam: There is no evidence of ulcer or pre-ulcerative changes or infection. Orthopedic Exam: Muscle tone and strength are WNL. No limitations in general ROM. No crepitus or effusions noted. Foot type and digits show no abnormalities.  Skin: No Porokeratosis. No infection or ulcers.    Diagnosis:  Onychomycosis, , Pain in right toe, pain in left toes  Treatment & Plan Procedures and Treatment: Consent by patient was obtained for treatment procedures. The patient understood the discussion of treatment and procedures well. All questions were answered thoroughly reviewed. Debridement of mycotic and hypertrophic toenails, 1 through 5 bilateral and clearing of subungual debris. No ulceration, no infection noted. ..   Return Visit-Office Procedure: Patient instructed to return to the office for a follow up visit 3 months for continued evaluation and treatment.    Moataz Tavis DPM 

## 2020-09-22 ENCOUNTER — Other Ambulatory Visit: Payer: Self-pay | Admitting: Urology

## 2020-09-29 ENCOUNTER — Inpatient Hospital Stay: Payer: Medicare Other | Attending: Internal Medicine | Admitting: Internal Medicine

## 2020-09-29 ENCOUNTER — Inpatient Hospital Stay: Payer: Medicare Other

## 2020-09-29 ENCOUNTER — Encounter: Payer: Self-pay | Admitting: Internal Medicine

## 2020-09-29 DIAGNOSIS — F79 Unspecified intellectual disabilities: Secondary | ICD-10-CM | POA: Diagnosis not present

## 2020-09-29 DIAGNOSIS — Z593 Problems related to living in residential institution: Secondary | ICD-10-CM | POA: Insufficient documentation

## 2020-09-29 DIAGNOSIS — D696 Thrombocytopenia, unspecified: Secondary | ICD-10-CM | POA: Insufficient documentation

## 2020-09-29 DIAGNOSIS — Z79899 Other long term (current) drug therapy: Secondary | ICD-10-CM | POA: Diagnosis not present

## 2020-09-29 LAB — TECHNOLOGIST SMEAR REVIEW: Plt Morphology: DECREASED

## 2020-09-29 LAB — HEPATITIS C ANTIBODY: HCV Ab: NONREACTIVE

## 2020-09-29 LAB — CBC WITH DIFFERENTIAL/PLATELET
Abs Immature Granulocytes: 0.11 10*3/uL — ABNORMAL HIGH (ref 0.00–0.07)
Basophils Absolute: 0.1 10*3/uL (ref 0.0–0.1)
Basophils Relative: 1 %
Eosinophils Absolute: 0.1 10*3/uL (ref 0.0–0.5)
Eosinophils Relative: 1 %
HCT: 39.7 % (ref 36.0–46.0)
Hemoglobin: 13.7 g/dL (ref 12.0–15.0)
Immature Granulocytes: 2 %
Lymphocytes Relative: 51 %
Lymphs Abs: 2.3 10*3/uL (ref 0.7–4.0)
MCH: 32.7 pg (ref 26.0–34.0)
MCHC: 34.5 g/dL (ref 30.0–36.0)
MCV: 94.7 fL (ref 80.0–100.0)
Monocytes Absolute: 0.6 10*3/uL (ref 0.1–1.0)
Monocytes Relative: 13 %
Neutro Abs: 1.4 10*3/uL — ABNORMAL LOW (ref 1.7–7.7)
Neutrophils Relative %: 32 %
Platelets: 101 10*3/uL — ABNORMAL LOW (ref 150–400)
RBC: 4.19 MIL/uL (ref 3.87–5.11)
RDW: 12.1 % (ref 11.5–15.5)
WBC: 4.6 10*3/uL (ref 4.0–10.5)
nRBC: 0 % (ref 0.0–0.2)

## 2020-09-29 LAB — COMPREHENSIVE METABOLIC PANEL
ALT: 12 U/L (ref 0–44)
AST: 15 U/L (ref 15–41)
Albumin: 3.6 g/dL (ref 3.5–5.0)
Alkaline Phosphatase: 58 U/L (ref 38–126)
Anion gap: 10 (ref 5–15)
BUN: 23 mg/dL — ABNORMAL HIGH (ref 6–20)
CO2: 31 mmol/L (ref 22–32)
Calcium: 9.4 mg/dL (ref 8.9–10.3)
Chloride: 99 mmol/L (ref 98–111)
Creatinine, Ser: 0.52 mg/dL (ref 0.44–1.00)
GFR, Estimated: >60 mL/min (ref >60)
Glucose, Bld: 51 mg/dL — ABNORMAL LOW (ref 70–99)
Potassium: 4.2 mmol/L (ref 3.5–5.1)
Sodium: 140 mmol/L (ref 135–145)
Total Bilirubin: 0.4 mg/dL (ref 0.3–1.2)
Total Protein: 6 g/dL — ABNORMAL LOW (ref 6.5–8.1)

## 2020-09-29 LAB — HIV ANTIBODY (ROUTINE TESTING W REFLEX): HIV Screen 4th Generation wRfx: NONREACTIVE

## 2020-09-29 LAB — LACTATE DEHYDROGENASE: LDH: 115 U/L (ref 98–192)

## 2020-09-29 LAB — HEPATITIS B SURFACE ANTIGEN: Hepatitis B Surface Ag: NONREACTIVE

## 2020-09-29 NOTE — Assessment & Plan Note (Addendum)
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  Do not suspect any malignant causes.  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  Patient is asymptomatic.  Do not recommend any interventions at this time.  #Check CBC CMP LDH HIV hepatitis C; HBsAg; review of peripheral smear.  Given the chronicity of the leukopenia/thrombocytopenia I do not think bone marrow biopsy would be very helpful at this time.  However bone marrow could be considered if counts are dropping.  Thank you, Ms.Tedrow. for allowing me to participate in the care of your pleasant patient. Please do not hesitate to contact me with questions or concerns in the interim. I plan to reach out to pt's mother, linda after the work up is available.   # DISPOSITION: # labs- today-ordered # follow up in 6 months- MD; labs- cbc/cmp-Dr.B

## 2020-09-29 NOTE — Progress Notes (Signed)
[place] NOTE  Patient Care Team: [facility], [facility] as PCP - General (General Practice)  CHIEF COMPLAINTS/PURPOSE OF CONSULTATION: Thrombocytopenia/leukopenia   HEMATOLOGY HISTORY  # THROMBOCYTOPENIA [2013- platelets- 80-90]/LEUCOPENIA [2018- ANC- 1-1.5]Hb-13; HIV/Hepatitis; Alcohol-none; 2019-CT- liver/spleen:[place].   HISTORY OF PRESENTING ILLNESS: Patient a poor historian given intellectual disability. Roberta Hampton 55 y.o.  female pleasant patient was been referred to [place] for further evaluation of thrombocytopenia/eucopenia.  Patient was recently evaluated by nephrology for- Bilateral extrarenal pelvis/UTI.  Incidentally noted to have thrombocytopenia/leukopenia.  Patient is referred to [place] for further evaluation.  Patient lives in a group home.  History is limited.  However as per the caregiver-patient has not had any frequent infections.  No frequent hospitalizations.  Denies any easy bleeding.  Patient is not on any blood thinners.   Review of Systems  Unable to perform ROS: Mental acuity     MEDICAL HISTORY:  Past Medical History:  Diagnosis Date  . Attention deficit disorder   . Hyponatremia    associated with polydipsia  . Leukopenia   . Mental retardation   . Obesity   . Onychomycosis   . Seizure disorder ([place])   . Seizures ([place])   . Thrombocytopenia ([place])     SURGICAL HISTORY: Past Surgical History:  Procedure Laterality Date  . ABDOMINAL HYSTERECTOMY    . ORIF ANKLE FRACTURE Left 05/08/2015   Procedure: OPEN REDUCTION INTERNAL FIXATION (ORIF) ANKLE FRACTURE;  Surgeon: Corky Mull, MD;  Location: [facility] ORS;  Service: Orthopedics;  Laterality: Left;  Marland Kitchen VAGINAL HYSTERECTOMY      SOCIAL HISTORY: Social History   Socioeconomic History  . Marital status: Single    Spouse name: Not on file  . Number of children: 0  . Years of education: Not on file  . Highest education level: Not on file  Occupational History  . Not  on file  Tobacco Use  . Smoking status: Never Smoker  . Smokeless tobacco: Never Used  Vaping Use  . Vaping Use: Never used  Substance and Sexual Activity  . Alcohol use: No  . Drug use: No  . Sexual activity: Not on file  Other Topics Concern  . Not on file  Social History Narrative   Patient is single and lives in a group home;  Patient does not have any children; Patient is right handed;Patient drinks caffeine daily. No smoking or alcohol.    Social Determinants of Health   Financial Resource Strain: Not on file  Food Insecurity: Not on file  Transportation Needs: Not on file  Physical Activity: Not on file  Stress: Not on file  Social Connections: Not on file  Intimate Partner Violence: Not on file    FAMILY HISTORY: Family History  Problem Relation Age of Onset  . Asthma Brother   . Seizures Other        paternal cousin  . Breast cancer Neg Hx     ALLERGIES:  has No Known Allergies.  MEDICATIONS:  Current Outpatient Medications  Medication Sig Dispense Refill  . Calcium Carb-Cholecalciferol (CALCIUM 600 + D PO) Take 600 mg by mouth 2 (two) times daily.    . Calcium Carbonate-Vitamin D 600-200 MG-UNIT TABS Take by mouth.    . CHILDRENS LORATADINE 5 MG/5ML syrup Take by mouth.    . clotrimazole (LOTRIMIN) 1 % cream     . cyanocobalamin 2000 MCG tablet Take 2,500 mcg by mouth daily.    Mariane Baumgarten Sodium 150 MG/15ML syrup Take  50 mg by mouth 2 (two) times daily.    . fluvoxaMINE (LUVOX) 25 MG tablet     . folic acid (FOLVITE) 1 MG tablet Take 1 mg by mouth daily.    Marland Kitchen guaiFENesin (ROBITUSSIN) 100 MG/5ML SOLN Take 5 mLs by mouth every 4 (four) hours as needed for cough or to loosen phlegm.    . loratadine (CLARITIN) 10 MG tablet Take 10 mg by mouth daily.    . mirabegron ER (MYRBETRIQ) 25 MG TB24 tablet Take 25 mg by mouth daily.    . Multiple Vitamin (THEREMS PO) Take 1 tablet by mouth daily.    . Nutritional Supplements (ENSURE) POWD Take by mouth 2 (two) times  daily.    Marland Kitchen oxybutynin (DITROPAN) 5 MG/5ML syrup TAKE 5 ML ($Rem'5MG'siwp$ ) BY MOUTH THREE TIMES A DAY 473 mL 0  . polyethylene glycol (MIRALAX / GLYCOLAX) packet Take 17 g by mouth daily.    Marland Kitchen PREMARIN vaginal cream     . QUEtiapine (SEROQUEL) 400 MG tablet Take 800 mg by mouth at bedtime.     . risperidone (RISPERDAL) 4 MG tablet Take 4 mg by mouth at bedtime.    Marland Kitchen UNABLE TO FIND     . valproic acid (DEPAKENE) 250 MG/5ML solution TAKE 10ML ($RemoveBe'500MG'OvXDkSOZY$ ) BY MOUTH EVERY MORNING TAKE 7.5ML ($RemoveBef'375MG'BkpbggejyZ$ ) BY MOUTH AT BEDTIME 525 mL 11  . vitamin E 200 UNIT capsule Take 200 Units by mouth daily.     No current facility-administered medications for this visit.     Marland Kitchen  PHYSICAL EXAMINATION:   Vitals:   09/29/20 1056  BP: 95/62  Pulse: 80  Resp: 16  Temp: (!) 96.4 F (35.8 C)  SpO2: 100%   Filed Weights   09/29/20 1056  Weight: 127 lb (57.6 kg)    Physical Exam   LABORATORY DATA:  I have reviewed the data as listed Lab Results  Component Value Date   WBC 3.0 (L) 05/08/2019   HGB 13.8 05/08/2019   HCT 40.3 05/08/2019   MCV 95 05/08/2019   PLT 92 (LL) 05/08/2019   No results for input(s): NA, K, CL, CO2, GLUCOSE, BUN, CREATININE, CALCIUM, GFRNONAA, GFRAA, PROT, ALBUMIN, AST, ALT, ALKPHOS, BILITOT, BILIDIR, IBILI in the last 8760 hours.   No results found.  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  Do not suspect any malignant causes.  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  Patient is asymptomatic.  Do not recommend any interventions at this time.  #Check CBC CMP LDH HIV hepatitis C; HBsAg; review of peripheral smear.  Given the chronicity of the leukopenia/thrombocytopenia I do not think bone marrow biopsy would be very helpful at this time.  However bone marrow could be considered if counts are  dropping.  Thank you, Ms.Tedrow. for allowing me to participate in the care of your pleasant patient. Please do not hesitate to contact me with questions or concerns in the interim. I plan to reach out to pt's mother, linda after the work up is available.   # DISPOSITION: # labs- today-ordered # follow up in 6 months- MD; labs- cbc/cmp-Dr.B    All questions were answered. The patient knows to call the clinic with any problems, questions or concerns.    Cammie Sickle, MD 09/29/2020 11:50 AM

## 2020-09-30 ENCOUNTER — Telehealth: Payer: Self-pay | Admitting: Internal Medicine

## 2020-09-30 NOTE — Telephone Encounter (Signed)
On 1/25-I called patient mother, linda regarding patient's plan of care.  Chronic mildthrombocytopenia/ leukopenia.  As asymptomatic would not recommend any work-up.  Follow-up as planned. GB

## 2020-10-02 ENCOUNTER — Telehealth: Payer: Self-pay | Admitting: Urology

## 2020-10-02 NOTE — Telephone Encounter (Signed)
Linna Hoff with [facility] [phone]) called the office to request a new prescription for Myrbetriq 25mg  to be sent to her pharmacy on file.    Please call and let her know.

## 2020-11-11 MED ORDER — MIRABEGRON ER 25 MG PO TB24: 25.0000 mg | ORAL_TABLET | Freq: Every day | ORAL | 3 refills | Status: DC

## 2020-11-11 NOTE — Telephone Encounter (Signed)
Patient's caregiver notified Myrbetriq has been sent to pharmacy.

## 2020-11-11 NOTE — Telephone Encounter (Signed)
It looks like she was on the Myrbetriq 25 mg daily and the oxybutynin IR 5 mg TID in 2019, so we can go ahead and refill the Myrbetriq.

## 2020-12-14 ENCOUNTER — Ambulatory Visit: Payer: Medicare Other | Admitting: Podiatry

## 2020-12-17 ENCOUNTER — Other Ambulatory Visit: Payer: Self-pay

## 2020-12-17 ENCOUNTER — Encounter: Payer: Self-pay | Admitting: Podiatry

## 2020-12-17 ENCOUNTER — Ambulatory Visit (INDEPENDENT_AMBULATORY_CARE_PROVIDER_SITE_OTHER): Payer: Medicare Other | Admitting: Podiatry

## 2020-12-17 DIAGNOSIS — M79674 Pain in right toe(s): Secondary | ICD-10-CM

## 2020-12-17 DIAGNOSIS — B351 Tinea unguium: Secondary | ICD-10-CM

## 2020-12-17 DIAGNOSIS — M79675 Pain in left toe(s): Secondary | ICD-10-CM | POA: Diagnosis not present

## 2020-12-17 NOTE — Progress Notes (Signed)
Complaint:  Visit Type: Patient returns to my office for continued preventative foot care services. Complaint: Patient states" my nails have grown long and thick and become painful to walk and wear shoes"  The patient presents for preventative foot care services. She presents to the office with her guardians  Podiatric Exam: Vascular: dorsalis pedis and posterior tibial pulses are palpable bilateral. Capillary return is immediate. Temperature gradient is WNL. Skin turgor WNL  Sensorium: Normal Semmes Weinstein monofilament test. Normal tactile sensation bilaterally. Nail Exam: Pt has thick disfigured discolored nails with subungual debris noted bilateral entire nail hallux through fifth toenails Ulcer Exam: There is no evidence of ulcer or pre-ulcerative changes or infection. Orthopedic Exam: Muscle tone and strength are WNL. No limitations in general ROM. No crepitus or effusions noted. Foot type and digits show no abnormalities.  Skin: No Porokeratosis. No infection or ulcers.    Diagnosis:  Onychomycosis, , Pain in right toe, pain in left toes  Treatment & Plan Procedures and Treatment: Consent by patient was obtained for treatment procedures. The patient understood the discussion of treatment and procedures well. All questions were answered thoroughly reviewed. Debridement of mycotic and hypertrophic toenails, 1 through 5 bilateral and clearing of subungual debris. No ulceration, no infection noted. ..   Return Visit-Office Procedure: Patient instructed to return to the office for a follow up visit 3 months for continued evaluation and treatment.    Gardiner Barefoot DPM

## 2020-12-28 ENCOUNTER — Ambulatory Visit
Admission: RE | Admit: 2020-12-28 | Discharge: 2020-12-28 | Disposition: A | Discharge status: Home / Self Care | Payer: Medicare Other | Source: Ambulatory Visit | Attending: Family Medicine | Admitting: Family Medicine

## 2020-12-28 ENCOUNTER — Other Ambulatory Visit: Payer: Self-pay

## 2020-12-28 DIAGNOSIS — Z1231 Encounter for screening mammogram for malignant neoplasm of breast: Secondary | ICD-10-CM | POA: Insufficient documentation

## 2021-03-18 ENCOUNTER — Other Ambulatory Visit: Payer: Self-pay

## 2021-03-18 ENCOUNTER — Ambulatory Visit (INDEPENDENT_AMBULATORY_CARE_PROVIDER_SITE_OTHER): Payer: Medicare Other | Admitting: Podiatry

## 2021-03-18 ENCOUNTER — Encounter: Payer: Self-pay | Admitting: Podiatry

## 2021-03-18 DIAGNOSIS — M79674 Pain in right toe(s): Secondary | ICD-10-CM | POA: Diagnosis not present

## 2021-03-18 DIAGNOSIS — D696 Thrombocytopenia, unspecified: Secondary | ICD-10-CM

## 2021-03-18 DIAGNOSIS — M79675 Pain in left toe(s): Secondary | ICD-10-CM

## 2021-03-18 DIAGNOSIS — B351 Tinea unguium: Secondary | ICD-10-CM

## 2021-03-18 NOTE — Progress Notes (Signed)
Complaint:  Visit Type: Patient returns to my office for continued preventative foot care services. Complaint: Patient states" my nails have grown long and thick and become painful to walk and wear shoes"  The patient presents for preventative foot care services. She presents to the office with her guardian  Podiatric Exam: Vascular: dorsalis pedis and posterior tibial pulses are palpable bilateral. Capillary return is immediate. Temperature gradient is WNL. Skin turgor WNL  Sensorium: Normal Semmes Weinstein monofilament test. Normal tactile sensation bilaterally. Nail Exam: Pt has thick disfigured discolored nails with subungual debris noted bilateral entire nail hallux through fifth toenails Ulcer Exam: There is no evidence of ulcer or pre-ulcerative changes or infection. Orthopedic Exam: Muscle tone and strength are WNL. No limitations in general ROM. No crepitus or effusions noted. Foot type and digits show no abnormalities.  Skin: No Porokeratosis. No infection or ulcers.    Diagnosis:  Onychomycosis, , Pain in right toe, pain in left toes  Treatment & Plan Procedures and Treatment: Consent by patient was obtained for treatment procedures. The patient understood the discussion of treatment and procedures well. All questions were answered thoroughly reviewed. Debridement of mycotic and hypertrophic toenails, 1 through 5 bilateral and clearing of subungual debris. No ulceration, no infection noted. ..   Return Visit-Office Procedure: Patient instructed to return to the office for a follow up visit 3 months for continued evaluation and treatment.    Gardiner Barefoot DPM

## 2021-03-30 ENCOUNTER — Other Ambulatory Visit: Payer: Self-pay

## 2021-03-30 ENCOUNTER — Inpatient Hospital Stay (HOSPITAL_BASED_OUTPATIENT_CLINIC_OR_DEPARTMENT_OTHER): Payer: Medicare Other | Admitting: Nurse Practitioner

## 2021-03-30 ENCOUNTER — Inpatient Hospital Stay: Payer: Medicare Other | Attending: Nurse Practitioner

## 2021-03-30 VITALS — BP 104/91 | HR 83 | Temp 97.1°F | Resp 18 | Wt 131.0 lb

## 2021-03-30 DIAGNOSIS — D709 Neutropenia, unspecified: Secondary | ICD-10-CM | POA: Diagnosis not present

## 2021-03-30 DIAGNOSIS — D72819 Decreased white blood cell count, unspecified: Secondary | ICD-10-CM | POA: Insufficient documentation

## 2021-03-30 DIAGNOSIS — D696 Thrombocytopenia, unspecified: Secondary | ICD-10-CM | POA: Diagnosis present

## 2021-03-30 DIAGNOSIS — Z79899 Other long term (current) drug therapy: Secondary | ICD-10-CM | POA: Diagnosis not present

## 2021-03-30 LAB — COMPREHENSIVE METABOLIC PANEL
ALT: 10 U/L (ref 0–44)
AST: 14 U/L — ABNORMAL LOW (ref 15–41)
Albumin: 3.6 g/dL (ref 3.5–5.0)
Alkaline Phosphatase: 54 U/L (ref 38–126)
Anion gap: 8 (ref 5–15)
BUN: 23 mg/dL — ABNORMAL HIGH (ref 6–20)
CO2: 29 mmol/L (ref 22–32)
Calcium: 9.2 mg/dL (ref 8.9–10.3)
Chloride: 102 mmol/L (ref 98–111)
Creatinine, Ser: 0.6 mg/dL (ref 0.44–1.00)
GFR, Estimated: >60 mL/min (ref >60)
Glucose, Bld: 88 mg/dL (ref 70–99)
Potassium: 4.1 mmol/L (ref 3.5–5.1)
Sodium: 139 mmol/L (ref 135–145)
Total Bilirubin: 0.5 mg/dL (ref 0.3–1.2)
Total Protein: 5.8 g/dL — ABNORMAL LOW (ref 6.5–8.1)

## 2021-03-30 LAB — CBC WITH DIFFERENTIAL/PLATELET
Abs Immature Granulocytes: 0.15 10*3/uL — ABNORMAL HIGH (ref 0.00–0.07)
Basophils Absolute: 0 10*3/uL (ref 0.0–0.1)
Basophils Relative: 1 %
Eosinophils Absolute: 0 10*3/uL (ref 0.0–0.5)
Eosinophils Relative: 1 %
HCT: 42.4 % (ref 36.0–46.0)
Hemoglobin: 14.2 g/dL (ref 12.0–15.0)
Immature Granulocytes: 4 %
Lymphocytes Relative: 43 %
Lymphs Abs: 1.5 10*3/uL (ref 0.7–4.0)
MCH: 33.1 pg (ref 26.0–34.0)
MCHC: 33.5 g/dL (ref 30.0–36.0)
MCV: 98.8 fL (ref 80.0–100.0)
Monocytes Absolute: 0.6 10*3/uL (ref 0.1–1.0)
Monocytes Relative: 16 %
Neutro Abs: 1.2 10*3/uL — ABNORMAL LOW (ref 1.7–7.7)
Neutrophils Relative %: 35 %
Platelets: 107 10*3/uL — ABNORMAL LOW (ref 150–400)
RBC: 4.29 MIL/uL (ref 3.87–5.11)
RDW: 12.5 % (ref 11.5–15.5)
WBC: 3.5 10*3/uL — ABNORMAL LOW (ref 4.0–10.5)
nRBC: 0 % (ref 0.0–0.2)

## 2021-03-30 NOTE — Progress Notes (Signed)
Roberta Hampton NOTE  Patient Care Team: Center, [facility] as PCP - General (General Practice)  CHIEF COMPLAINTS/PURPOSE OF CONSULTATION: Thrombocytopenia/leukopenia   HEMATOLOGY HISTORY:   # THROMBOCYTOPENIA [2013- platelets- 80-90]/LEUCOPENIA [2018- ANC- 1-1.5]Hb-13; HIV/Hepatitis; Alcohol-none; 2019-CT- liver/spleen:[place].   HISTORY OF PRESENTING ILLNESS: Patient a poor historian given intellectual disability. Roberta Hampton 55 y.o.  female pleasant patient who returns to clinic for labs and further evaluation of the following thrombocytopenia and leukopenia.  She continues to live at a group home due to intellectual disability.  History is limited.  She is accompanied by her caregiver today.  No recent infections or hospitalizations.  Denies easy bleeding or bruising.  She is not on blood thinners.  Continues to feel well and overall denies specific complaints.   Review of Systems  Unable to perform ROS: Mental acuity    MEDICAL HISTORY:  Past Medical History:  Diagnosis Date   Attention deficit disorder    Hyponatremia    associated with polydipsia   Leukopenia    Mental retardation    Obesity    Onychomycosis    Seizure disorder ([place])    Seizures (HCC)    Thrombocytopenia (HCC)     SURGICAL HISTORY: Past Surgical History:  Procedure Laterality Date   ABDOMINAL HYSTERECTOMY     ORIF ANKLE FRACTURE Left 05/08/2015   Procedure: OPEN REDUCTION INTERNAL FIXATION (ORIF) ANKLE FRACTURE;  Surgeon: Corky Mull, MD;  Location: [facility] ORS;  Service: Orthopedics;  Laterality: Left;   VAGINAL HYSTERECTOMY      SOCIAL HISTORY: Social History   Socioeconomic History   Marital status: Single    Spouse name: Not on file   Number of children: 0   Years of education: Not on file   Highest education level: Not on file  Occupational History   Not on file  Tobacco Use   Smoking status: Never   Smokeless tobacco: Never  Vaping Use   Vaping Use:  Never used  Substance and Sexual Activity   Alcohol use: No   Drug use: No   Sexual activity: Not on file  Other Topics Concern   Not on file  Social History Narrative   Patient is single and lives in a group home;  Patient does not have any children; Patient is right handed;Patient drinks caffeine daily. No smoking or alcohol.    Social Determinants of Health   Financial Resource Strain: Not on file  Food Insecurity: Not on file  Transportation Needs: Not on file  Physical Activity: Not on file  Stress: Not on file  Social Connections: Not on file  Intimate Partner Violence: Not on file    FAMILY HISTORY: Family History  Problem Relation Age of Onset   Asthma Brother    Seizures Other        paternal cousin   Breast cancer Neg Hx     ALLERGIES:  has No Known Allergies.  MEDICATIONS:  Current Outpatient Medications  Medication Sig Dispense Refill   Calcium Carb-Cholecalciferol (CALCIUM 600 + D PO) Take 600 mg by mouth 2 (two) times daily.     CHILDRENS LORATADINE 5 MG/5ML syrup Take by mouth.     clotrimazole (LOTRIMIN) 1 % cream      cyanocobalamin 2000 MCG tablet Take 2,500 mcg by mouth daily.     Docusate Sodium 150 MG/15ML syrup Take 50 mg by mouth 2 (two) times daily.     fluvoxaMINE (LUVOX) 25 MG tablet  folic acid (FOLVITE) 1 MG tablet Take 1 mg by mouth daily.     guaiFENesin (ROBITUSSIN) 100 MG/5ML SOLN Take 5 mLs by mouth every 4 (four) hours as needed for cough or to loosen phlegm.     mirabegron ER (MYRBETRIQ) 25 MG TB24 tablet Take 1 tablet (25 mg total) by mouth daily. 30 tablet 3   Multiple Vitamin (THEREMS PO) Take 1 tablet by mouth daily.     Nutritional Supplements (ENSURE) POWD Take by mouth 2 (two) times daily.     polyethylene glycol (MIRALAX / GLYCOLAX) packet Take 17 g by mouth daily.     QUEtiapine (SEROQUEL) 400 MG tablet Take 800 mg by mouth at bedtime.      risperidone (RISPERDAL) 4 MG tablet Take 4 mg by mouth at bedtime.     valproic  acid (DEPAKENE) 250 MG/5ML solution TAKE 10ML ($RemoveBe'500MG'GuZlxtygB$ ) BY MOUTH EVERY MORNING TAKE 7.5ML ($RemoveBef'375MG'mXYUSDSaIL$ ) BY MOUTH AT BEDTIME 525 mL 11   vitamin E 200 UNIT capsule Take 200 Units by mouth daily.     oxybutynin (DITROPAN) 5 MG/5ML syrup TAKE 5 ML ($Rem'5MG'ZpMv$ ) BY MOUTH THREE TIMES A DAY (Patient not taking: Reported on 03/30/2021) 473 mL 0   PREMARIN vaginal cream  (Patient not taking: Reported on 03/30/2021)     UNABLE TO FIND      No current facility-administered medications for this visit.     PHYSICAL EXAMINATION: Vitals:   03/30/21 1357  BP: (!) 104/91  Pulse: 83  Resp: 18  Temp: (!) 97.1 F (36.2 C)  SpO2: 99%   Filed Weights   03/30/21 1357  Weight: 131 lb (59.4 kg)   Physical Exam Constitutional:      Appearance: She is not ill-appearing.  Eyes:     General: No scleral icterus.    Conjunctiva/sclera: Conjunctivae normal.  Cardiovascular:     Rate and Rhythm: Normal rate and regular rhythm.  Abdominal:     General: There is no distension.     Palpations: Abdomen is soft.     Tenderness: There is no abdominal tenderness. There is no guarding.  Musculoskeletal:        General: No deformity.     Right lower leg: No edema.     Left lower leg: No edema.  Lymphadenopathy:     Cervical: No cervical adenopathy.  Skin:    General: Skin is warm and dry.  Neurological:     Mental Status: She is alert. Mental status is at baseline.     LABORATORY DATA:  I have reviewed the data as listed Lab Results  Component Value Date   WBC 3.5 (L) 03/30/2021   HGB 14.2 03/30/2021   HCT 42.4 03/30/2021   MCV 98.8 03/30/2021   PLT 107 (L) 03/30/2021   Recent Labs    09/29/20 1146 03/30/21 1303  NA 140 139  K 4.2 4.1  CL 99 102  CO2 31 29  GLUCOSE 51* 88  BUN 23* 23*  CREATININE 0.52 0.60  CALCIUM 9.4 9.2  GFRNONAA >60 >60  PROT 6.0* 5.8*  ALBUMIN 3.6 3.6  AST 15 14*  ALT 12 10  ALKPHOS 58 54  BILITOT 0.4 0.5     No results found.  ASSESSMENT & PLAN:   1.  Chronic mild  thrombocytopenia-since 2013.  Clinically, asymptomatic.  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  Will avoid follow-up question medication induced.  Appropriate follow-up for therapy relatively well for for malignant colitis although unlikely.  CT scan from 2019 revealed no hepatosplenomegaly.  Monitor  2.  Chronic mild leukopenia/neutropenia-since 2018.  Suspect immune related/medication use.  Patient is asymptomatic.  No intervention at this time.  Would not recommend bone marrow biopsy at this time however, would consider if counts are dropping.  Monitor.   DISPOSITION: 6 mo- lab (cbc, cmp), Dr. B  No problem-specific [place] notes found for this encounter.  All questions were answered. The patient knows to call the clinic with any problems, questions or concerns.   Verlon Au, NP 03/30/2021 4:56 PM

## 2021-05-03 ENCOUNTER — Other Ambulatory Visit: Payer: Self-pay | Admitting: *Deleted

## 2021-05-03 DIAGNOSIS — N2889 Other specified disorders of kidney and ureter: Secondary | ICD-10-CM

## 2021-05-06 ENCOUNTER — Ambulatory Visit
Admission: RE | Admit: 2021-05-06 | Discharge: 2021-05-06 | Disposition: A | Discharge status: Home / Self Care | Payer: Medicare Other | Source: Ambulatory Visit | Attending: Urology | Admitting: Urology

## 2021-05-06 ENCOUNTER — Other Ambulatory Visit: Payer: Self-pay

## 2021-05-06 DIAGNOSIS — N2889 Other specified disorders of kidney and ureter: Secondary | ICD-10-CM | POA: Insufficient documentation

## 2021-05-12 NOTE — Progress Notes (Signed)
05/13/2021 9:14 AM   Roberta Hampton [birth year] [id number]  Referring provider: [facility], [facility] [place],  [place] [id number]  Urological history: 1. rUTI's -contributing factors of age, vaginal atrophy, constipation, poor perineal hygiene, incontinence, sugary drinks and intellectual disability -Documented positive urine cultures over the last year  None  2. Incontinence -contributing factors of age, vaginal atrophy and sugary drinks -PVR 70 mL -RUS  -managed with Myrbetriq 25 mg daily   3. 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 female who presents today for RUS report with caregiver, Alora.   Patient cannot give history secondary to intellectual disability.   RUS 05/2021  Right greater than left renal cortical atrophy.  No hydronephrosis or other acute abnormality.  Staff has no complaints at this time. Staff denies any modifying or aggravating factors.  Staff denies any gross hematuria, dysuria or suprapubic/flank pain.  Staff denies any fevers, chills, nausea or vomiting.    PMH: Past Medical History:  Diagnosis Date   Attention deficit disorder    Hyponatremia    associated with polydipsia   Leukopenia    Mental retardation    Obesity    Onychomycosis    Seizure disorder ([place])    Seizures (HCC)    Thrombocytopenia (HCC)     Surgical History: Past Surgical History:  Procedure Laterality Date   ABDOMINAL HYSTERECTOMY     ORIF ANKLE FRACTURE Left 05/08/2015   Procedure: OPEN REDUCTION INTERNAL FIXATION (ORIF) ANKLE FRACTURE;  Surgeon: Corky Mull, MD;  Location: [facility] ORS;  Service: Orthopedics;  Laterality: Left;   VAGINAL HYSTERECTOMY      Home Medications:  Allergies as of 05/13/2021   No Known Allergies      Medication List         Accurate as of May 13, 2021  9:14 AM. If you have any questions, ask your nurse or doctor.          STOP taking these medications    oxybutynin 5 MG/5ML syrup Commonly known as: DITROPAN Stopped by: Jocelin Schuelke, PA-C       TAKE these medications    CALCIUM 600 + D PO Take 600 mg by mouth 2 (two) times daily.   Childrens Loratadine 5 MG/5ML syrup Generic drug: loratadine Take by mouth.   clotrimazole 1 % cream Commonly known as: LOTRIMIN   cyanocobalamin 2000 MCG tablet Take 2,500 mcg by mouth daily.   Docusate Sodium 150 MG/15ML syrup Take 50 mg by mouth 2 (two) times daily.   Ensure Powd Take by mouth 2 (two) times daily.   fluvoxaMINE 25 MG tablet Commonly known as: LUVOX   folic acid 1 MG tablet Commonly known as: FOLVITE Take 1 mg by mouth daily.   guaiFENesin 100 MG/5ML Soln Commonly known as: ROBITUSSIN Take 5 mLs by mouth every 4 (four) hours as needed for cough or to loosen phlegm.   mirabegron ER 25 MG Tb24 tablet Commonly known as: MYRBETRIQ Take 1 tablet (25 mg total) by mouth daily.   polyethylene glycol 17 g packet Commonly known as: MIRALAX / GLYCOLAX Take 17 g by mouth daily.   Premarin vaginal cream Generic drug: conjugated estrogens   QUEtiapine 400 MG tablet Commonly known as: SEROQUEL Take 800 mg by mouth  at bedtime.   risperidone 4 MG tablet Commonly known as: RISPERDAL Take 4 mg by mouth at bedtime.   THEREMS PO Take 1 tablet by mouth daily.   UNABLE TO FIND   valproic acid 250 MG/5ML solution Commonly known as: DEPAKENE TAKE 10ML ('500MG'$ ) BY MOUTH EVERY MORNING TAKE 7.5ML ('375MG'$ ) BY MOUTH AT BEDTIME   vitamin E 200 UNIT capsule Take 200 Units by mouth daily.        Allergies: No Known Allergies  Family History: Family History  Problem Relation Age of Onset   Asthma Brother    Seizures Other        paternal cousin   Breast cancer Neg Hx     Social History:  reports that she has never smoked. She  has never used smokeless tobacco. She reports that she does not drink alcohol and does not use drugs.  ROS: For pertinent review of systems please refer to history of present illness  Physical Exam: BP (!) 92/58   Pulse 82   Ht '5\' 2"'$  (1.575 m)   Wt 131 lb (59.4 kg)   BMI 23.96 kg/m   Constitutional:  Well nourished. Alert and oriented, No acute distress. HEENT: [place] AT, mask in place.  Trachea midline Cardiovascular: No clubbing, cyanosis, or edema. Respiratory: Normal respiratory effort, no increased work of breathing. Neurologic: Grossly intact, no focal deficits, moving all 4 extremities. Psychiatric: Normal mood and affect.    Laboratory Data: Lab Results  Component Value Date   WBC 3.5 (L) 03/30/2021   HGB 14.2 03/30/2021   HCT 42.4 03/30/2021   MCV 98.8 03/30/2021   PLT 107 (L) 03/30/2021    Lab Results  Component Value Date   CREATININE 0.60 03/30/2021    Lab Results  Component Value Date   AST 14 (L) 03/30/2021   Lab Results  Component Value Date   ALT 10 03/30/2021   I have reviewed the labs.  Pertinent Imaging Narrative & Impression  CLINICAL DATA:  Possible hydronephrosis   EXAM: RENAL / URINARY TRACT ULTRASOUND COMPLETE   COMPARISON:  Prior renal ultrasound 04/29/2020   FINDINGS: Right Kidney:   Renal measurements: 9.4 x 4.4 x 4.3 cm = volume: 94 mL. Diffuse renal cortical thinning with lipomatous hypertrophy of the renal pelvis. No evidence of hydronephrosis or mass. Parenchymal echogenicity within normal limits.   Left Kidney:   Renal measurements: 11.3 x 5.9 x 4.2 cm = volume: 146 mL. Mild renal cortical thinning with lipomatous hypertrophy of the renal pelvis. No hydronephrosis, nephrolithiasis or mass lesion.   Bladder:   Appears normal for degree of bladder distention.   Other:   None.   IMPRESSION: 1. Right greater than left renal cortical atrophy. 2. No hydronephrosis or other acute abnormality.     Electronically  Signed   By: Jacqulynn Cadet M.D.   On: 05/09/2021 08:49    I have independently reviewed the films.  See HPI.   Assessment & Plan:   1. Non-rotated right kidney RUS wnl RTC in two years for RUS as patient is non communicative  2. Incontinence -continue Myrbetriq 25 mg  3. Vaginal atrophy  No applying the cream                 Return in about 2 years (around 05/14/2023) for RUS.  These notes generated with voice recognition software. I apologize for typographical errors.  Zara Council, PA-C  [facility] [address] [place],  [address] [phone]  [phone]    

## 2021-05-13 ENCOUNTER — Encounter: Payer: Self-pay | Admitting: Urology

## 2021-05-13 ENCOUNTER — Other Ambulatory Visit: Payer: Self-pay

## 2021-05-13 ENCOUNTER — Ambulatory Visit (INDEPENDENT_AMBULATORY_CARE_PROVIDER_SITE_OTHER): Payer: Medicare Other | Admitting: Urology

## 2021-05-13 VITALS — BP 92/58 | HR 82 | Ht 62.0 in | Wt 131.0 lb

## 2021-05-13 DIAGNOSIS — R93429 Abnormal radiologic findings on diagnostic imaging of unspecified kidney: Secondary | ICD-10-CM | POA: Diagnosis not present

## 2021-05-14 ENCOUNTER — Other Ambulatory Visit: Payer: Self-pay | Admitting: Adult Health

## 2021-05-20 ENCOUNTER — Encounter: Payer: Self-pay | Admitting: Adult Health

## 2021-05-20 ENCOUNTER — Other Ambulatory Visit: Payer: Self-pay

## 2021-05-20 ENCOUNTER — Ambulatory Visit (INDEPENDENT_AMBULATORY_CARE_PROVIDER_SITE_OTHER): Payer: Medicare Other | Admitting: Adult Health

## 2021-05-20 VITALS — BP 105/70 | HR 87 | Ht 60.0 in | Wt 129.8 lb

## 2021-05-20 DIAGNOSIS — R569 Unspecified convulsions: Secondary | ICD-10-CM

## 2021-05-20 NOTE — Progress Notes (Signed)
I have read the note, and I agree with the clinical assessment and plan.  Shalie Schremp K Jazier Mcglamery   

## 2021-05-20 NOTE — Progress Notes (Signed)
PATIENT: Roberta Hampton DOB: [birth year]  REASON FOR VISIT: follow up HISTORY FROM: patient  HISTORY OF PRESENT ILLNESS: Today 05/20/21:  Ms. Heady is a 55 year old female with a history of seizures and mental retardation.  She returns today for follow-up.  Caregiver denies any seizure events.  She remains on Depakote.  Med list reviewed from group home.  Denies any new issues.  Returns today for follow-up  05/14/20 Ms. Colao is a 55 year old female with a history of seizures and mental retardation.  She is here today for follow-up.  She is here with her caregiver.  They deny any seizure events.  She remains on Depakote.  Returns today for an evaluation.  HISTORY 05/08/19:   Ms. Salais is a 55 year old female with a history of mental retardation and seizures.  She returns today for follow-up.  She is here today with caregiver.  The caregiver reports that she is not had any seizure events.  Denies any falls.  She continues on Depakote.  Denies any new symptoms.  She returns today for an evaluation.  REVIEW OF SYSTEMS: Out of a complete 14 system review of symptoms, the patient complains only of the following symptoms, and all other reviewed systems are negative.  See HPI  ALLERGIES: No Known Allergies  HOME MEDICATIONS: Outpatient Medications Prior to Visit  Medication Sig Dispense Refill   Calcium Carb-Cholecalciferol (CALCIUM 600 + D PO) Take 600 mg by mouth 2 (two) times daily.     CHILDRENS LORATADINE 5 MG/5ML syrup Take by mouth.     clotrimazole (LOTRIMIN) 1 % cream      cyanocobalamin 2000 MCG tablet Take 2,500 mcg by mouth daily.     Docusate Sodium 150 MG/15ML syrup Take 50 mg by mouth 2 (two) times daily.     fluvoxaMINE (LUVOX) 25 MG tablet      folic acid (FOLVITE) 1 MG tablet Take 1 mg by mouth daily.     guaiFENesin (ROBITUSSIN) 100 MG/5ML SOLN Take 5 mLs by mouth every 4 (four) hours as needed for cough or to loosen phlegm.     mirabegron ER (MYRBETRIQ) 25 MG TB24  tablet Take 1 tablet (25 mg total) by mouth daily. 30 tablet 3   Multiple Vitamin (THEREMS PO) Take 1 tablet by mouth daily.     Nutritional Supplements (ENSURE) POWD Take by mouth 2 (two) times daily.     polyethylene glycol (MIRALAX / GLYCOLAX) packet Take 17 g by mouth daily.     PREMARIN vaginal cream      QUEtiapine (SEROQUEL) 400 MG tablet Take 800 mg by mouth at bedtime.      risperidone (RISPERDAL) 4 MG tablet Take 4 mg by mouth at bedtime.     UNABLE TO FIND      valproic acid (DEPAKENE) 250 MG/5ML solution TAKE 10MLS ('500MG'$ ) BY MOUTH EVERY MORNING;TAKE 7.5MLS ('375MG'$ ) BY MOUTH EVERY NIGHT AT BEDTIME 525 mL 10   vitamin E 200 UNIT capsule Take 200 Units by mouth daily.     No facility-administered medications prior to visit.    PAST MEDICAL HISTORY: Past Medical History:  Diagnosis Date   Attention deficit disorder    Hyponatremia    associated with polydipsia   Leukopenia    Mental retardation    Obesity    Onychomycosis    Seizure disorder ([place])    Seizures (HCC)    Thrombocytopenia (HCC)     PAST SURGICAL HISTORY: Past Surgical History:  Procedure Laterality Date  ABDOMINAL HYSTERECTOMY     ORIF ANKLE FRACTURE Left 05/08/2015   Procedure: OPEN REDUCTION INTERNAL FIXATION (ORIF) ANKLE FRACTURE;  Surgeon: Corky Mull, MD;  Location: [facility] ORS;  Service: Orthopedics;  Laterality: Left;   VAGINAL HYSTERECTOMY      FAMILY HISTORY: Family History  Problem Relation Age of Onset   Asthma Brother    Seizures Other        paternal cousin   Breast cancer Neg Hx     SOCIAL HISTORY: Social History   Socioeconomic History   Marital status: Single    Spouse name: Not on file   Number of children: 0   Years of education: Not on file   Highest education level: Not on file  Occupational History   Not on file  Tobacco Use   Smoking status: Never   Smokeless tobacco: Never  Vaping Use   Vaping Use: Never used  Substance and Sexual Activity   Alcohol use: No    Drug use: No   Sexual activity: Not on file  Other Topics Concern   Not on file  Social History Narrative   Patient is single and lives in a group home;  Patient does not have any children; Patient is right handed;Patient drinks caffeine daily. No smoking or alcohol.    Social Determinants of Health   Financial Resource Strain: Not on file  Food Insecurity: Not on file  Transportation Needs: Not on file  Physical Activity: Not on file  Stress: Not on file  Social Connections: Not on file  Intimate Partner Violence: Not on file      PHYSICAL EXAM  Vitals:   05/20/21 0923  BP: 105/70  Pulse: 87  Weight: 129 lb 12.8 oz (58.9 kg)  Height: 5' (1.524 m)   Body mass index is 25.35 kg/m.  Generalized: Well developed, in no acute distress   Neurological examination  Mentation: Alert. Follows all commands speech  Garbled. Cranial nerve II-XII: Pupils were equal round reactive to light. Extraocular movements were full, visual field were full on confrontational test.. Head turning and shoulder shrug  were normal and symmetric. Motor: The motor testing reveals 5 over 5 strength of all 4 extremities. Good symmetric motor tone is noted throughout.  Sensory: Sensory testing is intact to soft touch on all 4 extremities. No evidence of extinction is noted.  Coordination: Cerebellar testing reveals good finger-nose-finger and heel-to-shin bilaterally.  Gait and station: Uses a walker when ambulating   DIAGNOSTIC DATA (LABS, IMAGING, TESTING) - I reviewed patient records, labs, notes, testing and imaging myself where available.  Lab Results  Component Value Date   WBC 3.5 (L) 03/30/2021   HGB 14.2 03/30/2021   HCT 42.4 03/30/2021   MCV 98.8 03/30/2021   PLT 107 (L) 03/30/2021      Component Value Date/Time   NA 139 03/30/2021 1303   NA 140 05/08/2019 0936   NA 143 11/13/2011 2004   K 4.1 03/30/2021 1303   K 3.8 11/13/2011 2004   CL 102 03/30/2021 1303   CL 105 11/13/2011  2004   CO2 29 03/30/2021 1303   CO2 26 11/13/2011 2004   GLUCOSE 88 03/30/2021 1303   GLUCOSE 96 11/13/2011 2004   BUN 23 (H) 03/30/2021 1303   BUN 16 05/08/2019 0936   BUN 17 11/13/2011 2004   CREATININE 0.60 03/30/2021 1303   CREATININE 0.58 (L) 11/13/2011 2004   CALCIUM 9.2 03/30/2021 1303   CALCIUM 9.0 11/13/2011 2004   PROT 5.8 (  L) 03/30/2021 1303   PROT 5.0 (L) 05/08/2019 0936   PROT 6.4 11/13/2011 2004   ALBUMIN 3.6 03/30/2021 1303   ALBUMIN 3.4 (L) 05/08/2019 0936   ALBUMIN 3.5 11/13/2011 2004   AST 14 (L) 03/30/2021 1303   AST 18 11/13/2011 2004   ALT 10 03/30/2021 1303   ALT 26 11/13/2011 2004   ALKPHOS 54 03/30/2021 1303   ALKPHOS 91 11/13/2011 2004   BILITOT 0.5 03/30/2021 1303   BILITOT 0.3 05/08/2019 0936   BILITOT 0.7 11/13/2011 2004   GFRNONAA >60 03/30/2021 1303   GFRNONAA >60 11/13/2011 2004   GFRAA 114 05/08/2019 0936   GFRAA >60 11/13/2011 2004      ASSESSMENT AND PLAN 55 y.o. year old female  has a past medical history of Attention deficit disorder, Hyponatremia, Leukopenia, Mental retardation, Obesity, Onychomycosis, Seizure disorder ([place]), Seizures ([place]), and Thrombocytopenia ([place]). here with:  Seizures  Continue Depakote 500 mg in the AM, 375 mg at bedtime Patient recently had blood work through her PCP --reviewed in [place] if she has any seizure events they should let us know Follow-up in 1 year or sooner if needed   I spent 20 minutes of face-to-face and non-face-to-face time with patient.  This included previsit chart review, lab review, study review, order entry, electronic health record documentation, patient education.  Ward Givens, MSN, NP-C 05/20/2021, 9:34 AM [facility] [address] [place], [place] [address] [phone]

## 2021-05-31 ENCOUNTER — Other Ambulatory Visit: Payer: Self-pay | Admitting: Urology

## 2021-06-28 ENCOUNTER — Ambulatory Visit: Payer: Medicare Other | Admitting: Podiatry

## 2021-07-27 ENCOUNTER — Telehealth: Payer: Self-pay

## 2021-07-27 NOTE — Telephone Encounter (Signed)
Caregiver dropped of lab results with "panic low" alert on platelet count. Dr. B has reviewed and states that there is no need for action at this time unless there is any abnormal signs of bleeding, Continue to monitor. Called and spoke to caregiver Linna Hoff to notify her of this and to keep appts in Jan. She verbalized understanding.

## 2021-09-27 ENCOUNTER — Other Ambulatory Visit: Payer: Self-pay | Admitting: *Deleted

## 2021-09-27 DIAGNOSIS — D696 Thrombocytopenia, unspecified: Secondary | ICD-10-CM

## 2021-09-27 DIAGNOSIS — D709 Neutropenia, unspecified: Secondary | ICD-10-CM

## 2021-10-04 ENCOUNTER — Inpatient Hospital Stay: Payer: Medicare Other | Attending: Internal Medicine

## 2021-10-04 ENCOUNTER — Inpatient Hospital Stay: Payer: Medicare Other | Admitting: Internal Medicine

## 2021-10-28 ENCOUNTER — Ambulatory Visit: Payer: Medicare Other | Admitting: Podiatry

## 2021-11-15 ENCOUNTER — Ambulatory Visit: Payer: Medicare Other | Admitting: Podiatry

## 2021-12-01 ENCOUNTER — Other Ambulatory Visit: Payer: Self-pay | Admitting: Family Medicine

## 2021-12-01 DIAGNOSIS — Z1231 Encounter for screening mammogram for malignant neoplasm of breast: Secondary | ICD-10-CM

## 2021-12-10 ENCOUNTER — Emergency Department
Admission: EM | Admit: 2021-12-10 | Discharge: 2021-12-10 | Disposition: A | Discharge status: Home / Self Care | Payer: Medicare Other | Attending: Emergency Medicine | Admitting: Emergency Medicine

## 2021-12-10 ENCOUNTER — Emergency Department: Payer: Medicare Other

## 2021-12-10 ENCOUNTER — Other Ambulatory Visit: Payer: Self-pay

## 2021-12-10 DIAGNOSIS — R519 Headache, unspecified: Secondary | ICD-10-CM | POA: Diagnosis not present

## 2021-12-10 DIAGNOSIS — M899 Disorder of bone, unspecified: Secondary | ICD-10-CM

## 2021-12-10 DIAGNOSIS — W06XXXA Fall from bed, initial encounter: Secondary | ICD-10-CM | POA: Insufficient documentation

## 2021-12-10 DIAGNOSIS — M89252 Other disorders of bone development and growth, left femur: Secondary | ICD-10-CM | POA: Diagnosis present

## 2021-12-10 DIAGNOSIS — W19XXXA Unspecified fall, initial encounter: Secondary | ICD-10-CM

## 2021-12-10 NOTE — ED Provider Notes (Signed)
? ?[facility] ?Provider Note ? ? ? Event Date/Time  ? First MD Initiated Contact with Patient 12/10/21 1120   ?  (approximate) ? ? ?History  ? ?Fall ? ? ?HPI ? ?Roberta Hampton is a 56 y.o. female with past medical history of intellectual disability, ADHD, seizure disorder presents after a fall.  Patient was making her bed when she fell backward from standing.  Did hit her head unclear if she lost consciousness.  Patient has been acting like her self since.  Denies pain.  Caregiver denies any vomiting she is not on blood thinners. ?  ? ?Past Medical History:  ?Diagnosis Date  ? Attention deficit disorder   ? 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 Leukopenia   ? Mental retardation   ? Obesity   ? Onychomycosis   ? Seizure disorder ([place])   ? Seizures ([place])   ? Thrombocytopenia ([place])   ? ? ?Patient Active Problem List  ? Diagnosis Date Noted  ? Neutropenia ([place]) 03/30/2021  ? Thrombocytopenia ([place]) 09/29/2020  ? Pain due to onychomycosis of toenails of both feet 03/14/2019  ? Developmental delay 10/02/2017  ? Encounter for general adult medical examination without abnormal findings 12/17/2015  ? Pressure ulcer 06/28/2015  ? Sepsis affecting skin 06/27/2015  ? Ankle fracture, bimalleolar, closed 05/11/2015  ? Ankle fracture 05/08/2015  ? 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 Encounter for therapeutic drug monitoring 07/22/2013  ? Mental retardation 07/22/2013  ? ? ? ?Physical Exam  ?Triage Vital Signs: ?ED Triage Vitals  ?Enc Vitals Group  ?   BP 12/10/21 1105 101/69  ?   Pulse Rate 12/10/21 1105 93  ?   Resp 12/10/21 1105 19  ?   Temp 12/10/21 1105 98.2 ?F (36.8 ?C)  ?   Temp Source 12/10/21 1105 Oral  ?   SpO2 12/10/21 1105 97 %  ?   Weight --   ?   Height --   ?   Head Circumference --   ?   Peak Flow --   ?   Pain Score 12/10/21 1104 0  ?   Pain Loc --   ?   Pain Edu? --   ?   Excl. in [place]? --   ? ? ?Most recent vital signs: ?Vitals:  ? 12/10/21  1105  ?BP: 101/69  ?Pulse: 93  ?Resp: 19  ?Temp: 98.2 ?F (36.8 ?C)  ?SpO2: 97%  ? ? ? ?General: Awake, no distress.  ?CV:  Good peripheral perfusion.  ?Resp:  Normal effort.  ?Abd:  No distention.  ?Neuro:             Patient is delayed, able to follow commands ?Other:  Mild swelling of the posterior occiput, tenderness to palpation of the C-spine ?5 out of 5 strength with grip and elbow extension ? ? ?ED Results / Procedures / Treatments  ?Labs ?(all labs ordered are listed, but only abnormal results are displayed) ?Labs Reviewed - No data to display ? ? ?EKG ? ? ? ? ?RADIOLOGY ? ?I reviewed the CT scan of the brain which does not show any acute intracranial process; agree with radiology report  ? ? ? ?PROCEDURES: ? ?Critical Care performed: No ? ?Procedures ? ? ?MEDICATIONS ORDERED IN ED: ?Medications - No data to display ? ? ?IMPRESSION / MDM / ASSESSMENT AND PLAN / ED COURSE  ?I reviewed the triage vital signs and the nursing notes. ?             ?               ? ?  Differential diagnosis includes, but is not limited to, occipital contusion, intracranial hemorrhage ? ?56 year old female with intellectual disability presents after mechanical fall.  Occurred while she was making her bed she put her hands up and accidentally lost balance falling backward.  This was witnessed.  Patient initially denying any pain however with palpation of the head and neck she is somewhat tender, unclear if this is behavioral or if she is actually in pain.  There is mild posterior occipital swelling.  Given she is somewhat difficult to assess we will obtain a CT head and C-spine.  No other signs of trauma on exam.  She has not had any change in mental status. ? ? ?CT head and neck are without acute abnormality.  However there are multiple lytic lesions documented.  Reviewed patient's record she has no history of multiple myeloma or cancer.  Reviewed recent labs done from nephrology including BMP which did not show any abnormality in  her kidney function or hypercalcemia.  Discussed with patient's caretaker and strongly recommended that they follow-up with primary care she is going to need a work-up for underlying malignancy. ?  ? ? ?FINAL CLINICAL IMPRESSION(S) / ED DIAGNOSES  ? ?Final diagnoses:  ?Fall, initial encounter  ?Lytic bone lesions on xray  ? ? ? ?Rx / DC Orders  ? ?ED Discharge Orders   ? ? None  ? ?  ? ? ? ?Note:  This document was prepared using Dragon voice recognition software and may include unintentional dictation errors. ?  ?Rada Hay, MD ?12/10/21 1314 ? ?

## 2021-12-10 NOTE — ED Triage Notes (Signed)
Pt was making her bed this morning and hand slipped of the sheet and fell backwards, pt denies any pain or injury, care giver states they have to have them checked out after a fall ? ?

## 2021-12-10 NOTE — Discharge Instructions (Signed)
Your CAT scan did not show any traumatic injury to your brain or neck.  However we did see numerous lytic lesions on your cervical spine and base of the skull which could indicate metastatic cancer or multiple myeloma.  Please follow-up with your primary care provider for further work-up. ?

## 2021-12-25 ENCOUNTER — Other Ambulatory Visit: Payer: Self-pay | Admitting: Family Medicine

## 2021-12-25 DIAGNOSIS — M899 Disorder of bone, unspecified: Secondary | ICD-10-CM

## 2021-12-27 ENCOUNTER — Other Ambulatory Visit: Payer: Self-pay | Admitting: Family Medicine

## 2021-12-27 DIAGNOSIS — M899 Disorder of bone, unspecified: Secondary | ICD-10-CM

## 2022-01-06 ENCOUNTER — Ambulatory Visit
Admission: RE | Admit: 2022-01-06 | Discharge: 2022-01-06 | Disposition: A | Discharge status: Home / Self Care | Payer: Medicare Other | Source: Ambulatory Visit | Attending: Family Medicine | Admitting: Family Medicine

## 2022-01-06 ENCOUNTER — Ambulatory Visit: Payer: Medicare Other

## 2022-01-06 DIAGNOSIS — M899 Disorder of bone, unspecified: Secondary | ICD-10-CM | POA: Diagnosis present

## 2022-01-06 DIAGNOSIS — Z90710 Acquired absence of both cervix and uterus: Secondary | ICD-10-CM | POA: Insufficient documentation

## 2022-01-06 DIAGNOSIS — D7389 Other diseases of spleen: Secondary | ICD-10-CM | POA: Diagnosis not present

## 2022-01-06 DIAGNOSIS — F79 Unspecified intellectual disabilities: Secondary | ICD-10-CM | POA: Diagnosis not present

## 2022-01-06 DIAGNOSIS — J90 Pleural effusion, not elsewhere classified: Secondary | ICD-10-CM | POA: Insufficient documentation

## 2022-01-06 MED ORDER — IOHEXOL 300 MG/ML  SOLN
100.0000 mL | Freq: Once | INTRAMUSCULAR | Status: AC | PRN
  Administered 2022-01-06: 100 mL via INTRAVENOUS

## 2022-01-20 ENCOUNTER — Telehealth: Payer: Self-pay | Admitting: Internal Medicine

## 2022-01-20 NOTE — Telephone Encounter (Signed)
Spoke with caregiver to inform her of appt scheduled .Marland KitchenMarland KitchenKJ

## 2022-01-28 ENCOUNTER — Ambulatory Visit
Admission: RE | Admit: 2022-01-28 | Discharge: 2022-01-28 | Disposition: A | Discharge status: Home / Self Care | Payer: Medicare Other | Source: Ambulatory Visit | Attending: Family Medicine | Admitting: Family Medicine

## 2022-01-28 DIAGNOSIS — Z1231 Encounter for screening mammogram for malignant neoplasm of breast: Secondary | ICD-10-CM | POA: Diagnosis present

## 2022-02-04 ENCOUNTER — Inpatient Hospital Stay: Payer: Medicare Other | Attending: Internal Medicine | Admitting: Internal Medicine

## 2022-02-04 ENCOUNTER — Encounter: Payer: Self-pay | Admitting: Internal Medicine

## 2022-02-04 ENCOUNTER — Inpatient Hospital Stay: Payer: Medicare Other

## 2022-02-04 DIAGNOSIS — Z593 Problems related to living in residential institution: Secondary | ICD-10-CM | POA: Insufficient documentation

## 2022-02-04 DIAGNOSIS — M899 Disorder of bone, unspecified: Secondary | ICD-10-CM | POA: Diagnosis not present

## 2022-02-04 DIAGNOSIS — D72819 Decreased white blood cell count, unspecified: Secondary | ICD-10-CM | POA: Insufficient documentation

## 2022-02-04 DIAGNOSIS — Z79899 Other long term (current) drug therapy: Secondary | ICD-10-CM | POA: Insufficient documentation

## 2022-02-04 DIAGNOSIS — Z836 Family history of other diseases of the respiratory system: Secondary | ICD-10-CM | POA: Insufficient documentation

## 2022-02-04 DIAGNOSIS — F72 Severe intellectual disabilities: Secondary | ICD-10-CM | POA: Insufficient documentation

## 2022-02-04 DIAGNOSIS — D696 Thrombocytopenia, unspecified: Secondary | ICD-10-CM | POA: Insufficient documentation

## 2022-02-04 DIAGNOSIS — J90 Pleural effusion, not elsewhere classified: Secondary | ICD-10-CM | POA: Diagnosis not present

## 2022-02-04 DIAGNOSIS — Z820 Family history of epilepsy and other diseases of the nervous system: Secondary | ICD-10-CM | POA: Insufficient documentation

## 2022-02-04 LAB — CBC WITH DIFFERENTIAL/PLATELET
Abs Immature Granulocytes: 0.08 10*3/uL — ABNORMAL HIGH (ref 0.00–0.07)
Basophils Absolute: 0 10*3/uL (ref 0.0–0.1)
Basophils Relative: 1 %
Eosinophils Absolute: 0 10*3/uL (ref 0.0–0.5)
Eosinophils Relative: 1 %
HCT: 38.7 % (ref 36.0–46.0)
Hemoglobin: 13 g/dL (ref 12.0–15.0)
Immature Granulocytes: 2 %
Lymphocytes Relative: 45 %
Lymphs Abs: 1.5 10*3/uL (ref 0.7–4.0)
MCH: 33.3 pg (ref 26.0–34.0)
MCHC: 33.6 g/dL (ref 30.0–36.0)
MCV: 99.2 fL (ref 80.0–100.0)
Monocytes Absolute: 0.5 10*3/uL (ref 0.1–1.0)
Monocytes Relative: 15 %
Neutro Abs: 1.2 10*3/uL — ABNORMAL LOW (ref 1.7–7.7)
Neutrophils Relative %: 36 %
Platelets: 96 10*3/uL — ABNORMAL LOW (ref 150–400)
RBC: 3.9 MIL/uL (ref 3.87–5.11)
RDW: 12.6 % (ref 11.5–15.5)
WBC: 3.3 10*3/uL — ABNORMAL LOW (ref 4.0–10.5)
nRBC: 0 % (ref 0.0–0.2)

## 2022-02-04 LAB — LACTATE DEHYDROGENASE: LDH: 100 U/L (ref 98–192)

## 2022-02-04 LAB — TECHNOLOGIST SMEAR REVIEW: Plt Morphology: DECREASED

## 2022-02-04 LAB — COMPREHENSIVE METABOLIC PANEL
ALT: 12 U/L (ref 0–44)
AST: 14 U/L — ABNORMAL LOW (ref 15–41)
Albumin: 3.5 g/dL (ref 3.5–5.0)
Alkaline Phosphatase: 67 U/L (ref 38–126)
Anion gap: 9 (ref 5–15)
BUN: 19 mg/dL (ref 6–20)
CO2: 29 mmol/L (ref 22–32)
Calcium: 9.1 mg/dL (ref 8.9–10.3)
Chloride: 99 mmol/L (ref 98–111)
Creatinine, Ser: 0.64 mg/dL (ref 0.44–1.00)
GFR, Estimated: >60 mL/min (ref >60)
Glucose, Bld: 110 mg/dL — ABNORMAL HIGH (ref 70–99)
Potassium: 3.6 mmol/L (ref 3.5–5.1)
Sodium: 137 mmol/L (ref 135–145)
Total Bilirubin: 0.5 mg/dL (ref 0.3–1.2)
Total Protein: 6.2 g/dL — ABNORMAL LOW (ref 6.5–8.1)

## 2022-02-04 NOTE — Progress Notes (Signed)
Pt and caregiver from group home facility in for follow up. Caregiver to contact patient's mother via telephone call for appointment today.

## 2022-02-04 NOTE — Assessment & Plan Note (Addendum)
#  Multiple Lytic Lesion-multiple lytic lesions noted on the CT scan all throughout the skeletal structures-raising the suspicion for multiple myeloma.  No obvious primary noted; May 2023 mammogram negative for malignancy.  We will also review at the tumor conference.  #Recommend further work-up including CBC CMP; myeloma panel kappa lambda light chain ratio; LDH.  Discussed with the patient's mother over the phone-the significant concerns of multiple myeloma/malignancy.  However I am not sure given patient's mental status if aggressive work-up including [PET scan bone marrow biopsy] is going to add value to her care, especially-as patient is extremely poor candidate for any aggressive therapies.   # Chronic mild thrombocytopenia [2013]/mild leukopenia/neutropenia [2018]-await labs from today.  Previous work-up including hepatitis work-up negative.   # DISPOSITION: # labs- today-ordered- # Follow up TBD--Dr.B  # 40 minutes face-to-face with the patient's mother discussing the above plan of care; more than 50% of time spent on prognosis/ natural history; counseling and coordination.

## 2022-02-04 NOTE — Progress Notes (Signed)
[place] NOTE  Patient Care Team: Center, [facility] as PCP - General ([place]) Cammie Sickle, MD as Consulting Physician (Oncology)  CHIEF COMPLAINTS/PURPOSE OF CONSULTATION: Multiple lytic lesions on CT scan   HEMATOLOGY HISTORY  # CT CAP [May 2nd, 2023]- Multiple lytic osseous lesions, favoring multiple myeloma over lytic metastases; No findings specific for primary malignancy in the chest, abdomen, or pelvis.  # THROMBOCYTOPENIA [id number]- platelets- 80-90]/LEUCOPENIA [2018- ANC- 1-1.5]Hb-13; HIV/Hepatitis; Alcohol-none; 2019-CT- liver/spleen:Normal.   HISTORY OF PRESENTING ILLNESS: Patient a poor historian given severe intellectual disability.  Accompanied by caregiver.  I spoke to the mother on the phone.   Roberta Hampton 56 y.o.  female pleasant patient was been referred to [place] for further evaluation multiple lytic lesions noted on imaging done in the emergency room.  Patient had a fall-which led to further evaluation emergency room in May that showed abnormal x-rays of the cervical spine.  Which led to further imaging with CT scan-chest and pelvis-that showed multiple lytic lesions without any obvious primary malignancy.  Patient is a poor historian does not endorse any significant complaints.   Of note patient was evaluated by me in 2022 for anemia/thrombocytopenia-likely benign.   Review of Systems  Unable to perform ROS: Mental acuity    MEDICAL HISTORY:  Past Medical History:  Diagnosis Date   Attention deficit disorder    Hyponatremia    associated with polydipsia   Leukopenia    Mental retardation    Obesity    Onychomycosis    Seizure disorder ([place])    Seizures ([facility])    Thrombocytopenia ([facility])     SURGICAL HISTORY: Past Surgical History:  Procedure Laterality Date   ABDOMINAL HYSTERECTOMY     ORIF ANKLE FRACTURE Left 05/08/2015   Procedure: OPEN REDUCTION INTERNAL FIXATION (ORIF) ANKLE FRACTURE;   Surgeon: Corky Mull, MD;  Location: [facility] ORS;  Service: Orthopedics;  Laterality: Left;   VAGINAL HYSTERECTOMY      SOCIAL HISTORY: Social History   Socioeconomic History   Marital status: Single    Spouse name: Not on file   Number of children: 0   Years of education: Not on file   Highest education level: Not on file  Occupational History   Not on file  Tobacco Use   Smoking status: Never   Smokeless tobacco: Never  Vaping Use   Vaping Use: Never used  Substance and Sexual Activity   Alcohol use: No   Drug use: No   Sexual activity: Not on file  Other Topics Concern   Not on file  Social History Narrative   Patient is single and lives in a group home; in [place] [> 25 years] Patient does not have any children; Patient is right handed;Patient drinks caffeine daily. No smoking or alcohol. Mom lives in [place].    Social Determinants of Health   Financial Resource Strain: Not on file  Food Insecurity: Not on file  Transportation Needs: Not on file  Physical Activity: Not on file  Stress: Not on file  Social Connections: Not on file  Intimate Partner Violence: Not on file    FAMILY HISTORY: Family History  Problem Relation Age of Onset   Asthma Brother    Seizures Other        paternal cousin   Breast cancer Neg Hx     ALLERGIES:  has No Known Allergies.  MEDICATIONS:  Current Outpatient Medications  Medication Sig Dispense Refill   Calcium Carb-Cholecalciferol (  CALCIUM 600 + D PO) Take 600 mg by mouth 2 (two) times daily.     CHILDRENS LORATADINE 5 MG/5ML syrup Take by mouth.     clotrimazole (LOTRIMIN) 1 % cream      cyanocobalamin 2000 MCG tablet Take 2,500 mcg by mouth daily.     Docusate Sodium 150 MG/15ML syrup Take 50 mg by mouth 2 (two) times daily.     fluvoxaMINE (LUVOX) 25 MG tablet      folic acid (FOLVITE) 1 MG tablet Take 1 mg by mouth daily.     guaiFENesin (ROBITUSSIN) 100 MG/5ML SOLN Take 5 mLs by mouth every 4 (four) hours as needed  for cough or to loosen phlegm.     Multiple Vitamin (THEREMS PO) Take 1 tablet by mouth daily.     MYRBETRIQ 25 MG TB24 tablet TAKE 1 TABLET BY MOUTH ONCE DAILY *DO NOT CRUSH OR CHEW* 90 tablet 3   Nutritional Supplements (ENSURE) POWD Take by mouth 2 (two) times daily.     polyethylene glycol (MIRALAX / GLYCOLAX) packet Take 17 g by mouth daily.     PREMARIN vaginal cream      QUEtiapine (SEROQUEL) 400 MG tablet Take 800 mg by mouth at bedtime.      risperidone (RISPERDAL) 4 MG tablet Take 4 mg by mouth at bedtime.     UNABLE TO FIND      valproic acid (DEPAKENE) 250 MG/5ML solution TAKE 10MLS (500MG ) BY MOUTH EVERY MORNING;TAKE 7.5MLS (375MG ) BY MOUTH EVERY NIGHT AT BEDTIME 525 mL 10   vitamin E 200 UNIT capsule Take 200 Units by mouth daily.     No current facility-administered medications for this visit.     Marland Kitchen  PHYSICAL EXAMINATION:   Vitals:   02/04/22 1354  BP: 100/60  Pulse: 77  Resp: 16  Temp: 98.3 F (36.8 C)  SpO2: 100%   Filed Weights   02/04/22 1354  Weight: 128 lb (58.1 kg)   Frail-appearing female patient in a wheelchair.  Physical Exam Vitals and nursing note reviewed.  HENT:     Head: Normocephalic and atraumatic.     Mouth/Throat:     Pharynx: Oropharynx is clear.  Eyes:     Extraocular Movements: Extraocular movements intact.     Pupils: Pupils are equal, round, and reactive to light.  Cardiovascular:     Rate and Rhythm: Normal rate and regular rhythm.  Pulmonary:     Comments: Decreased breath sounds bilaterally.  Abdominal:     Palpations: Abdomen is soft.  Musculoskeletal:        General: Normal range of motion.     Cervical back: Normal range of motion.  Skin:    General: Skin is warm.  Neurological:     General: No focal deficit present.     Mental Status: She is alert.  Psychiatric:     Comments: Pleasant not agitated.  Verbal but incoherent.  Unable to make any meaningful conversation.     LABORATORY DATA:  I have reviewed the  data as listed Lab Results  Component Value Date   WBC 3.5 (L) 03/30/2021   HGB 14.2 03/30/2021   HCT 42.4 03/30/2021   MCV 98.8 03/30/2021   PLT 107 (L) 03/30/2021   Recent Labs    03/30/21 1303  NA 139  K 4.1  CL 102  CO2 29  GLUCOSE 88  BUN 23*  CREATININE 0.60  CALCIUM 9.2  GFRNONAA >60  PROT 5.8*  ALBUMIN 3.6  AST  14*  ALT 10  ALKPHOS 54  BILITOT 0.5     CT CHEST ABDOMEN PELVIS W CONTRAST  Result Date: 01/07/2022 CLINICAL DATA:  Lytic cervical spine lesions, mets or multiple myeloma EXAM: CT CHEST, ABDOMEN, AND PELVIS WITH CONTRAST TECHNIQUE: Multidetector CT imaging of the chest, abdomen and pelvis was performed following the standard protocol during bolus administration of intravenous contrast. RADIATION DOSE REDUCTION: This exam was performed according to the departmental dose-optimization program which includes automated exposure control, adjustment of the mA and/or kV according to patient size and/or use of iterative reconstruction technique. CONTRAST:  174mL OMNIPAQUE IOHEXOL 300 MG/ML  SOLN COMPARISON:  CT abdomen/pelvis dated 11/20/2017 FINDINGS: CT CHEST FINDINGS Cardiovascular: The heart is normal in size. No pericardial effusion. No evidence of thoracic aortic aneurysm. Mediastinum/Nodes: No suspicious mediastinal lymphadenopathy. Visualized thyroid is unremarkable. Lungs/Pleura: Evaluation of the lung parenchyma is constrained by respiratory motion. Within that constraint, there are no suspicious pulmonary nodules. Small left and trace right pleural effusions. Mild linear/patchy opacity at the left lung base (series 8/image 81), likely atelectasis. No pneumothorax. Musculoskeletal: Heterogeneous, mottled appearance of the visualized osseous structures. Small lytic lesions are suspected throughout the vertebral bodies, favoring a diagnosis of multiple myeloma, although limited osseous are technically not excluded. CT ABDOMEN PELVIS FINDINGS Motion degraded images.  Hepatobiliary: Liver is within normal limits. Gallbladder is unremarkable. No intrahepatic or extrahepatic ductal dilatation. Pancreas: Within normal limits. Spleen: Multiple hypoenhancing splenic lesions measuring up to 2.5 cm (series 2/image 36), incompletely characterized, although typically benign. Adrenals/Urinary Tract: Adrenal glands are within normal limits. Kidneys are within normal limits, noting an extrarenal pelvis bilaterally. No frank hydronephrosis. Mildly thick-walled bladder, although underdistended. Trace nondependent gas. Stomach/Bowel: Stomach is within normal limits. No evidence of bowel obstruction. Normal appendix (series 2/image 39). Moderate colonic stool burden, suggesting constipation. No colonic wall thickening or mass is evident on CT. Vascular/Lymphatic: No evidence of abdominal aortic aneurysm. No suspicious abdominopelvic lymphadenopathy. Reproductive: Status post hysterectomy. Bilateral ovaries are within normal limits. Other: No abdominopelvic ascites. Musculoskeletal: Heterogeneous, moderate appearance of the visualized osseous structures. As noted above, multiple lytic lesions are suspected, favoring multiple myeloma over lytic metastases. IMPRESSION: Multiple lytic osseous lesions, favoring multiple myeloma over lytic metastases. No findings specific for primary malignancy in the chest, abdomen, or pelvis. Multiple low-density splenic lesions, incompletely characterized, although typically benign. Benign splenic lesions such as lymphangioma as and hemangiomas are most common. Lymphoma can technically have this appearance but would be unlikely given lack of supporting findings. Consider follow-up CT in 3-6 months. Small bilateral pleural effusions, left greater than right. Electronically Signed   By: Julian Hy M.D.   On: 01/07/2022 00:09   MM 3D SCREEN BREAST BILATERAL  Result Date: 02/01/2022 CLINICAL DATA:  Screening. EXAM: DIGITAL SCREENING BILATERAL MAMMOGRAM WITH  TOMOSYNTHESIS AND CAD TECHNIQUE: Bilateral screening digital craniocaudal and mediolateral oblique mammograms were obtained. Bilateral screening digital breast tomosynthesis was performed. The images were evaluated with computer-aided detection. COMPARISON:  Previous exam(s). ACR Breast Density Category c: The breast tissue is heterogeneously dense, which may obscure small masses. FINDINGS: There are no findings suspicious for malignancy. IMPRESSION: No mammographic evidence of malignancy. A result letter of this screening mammogram will be mailed directly to the patient. RECOMMENDATION: Screening mammogram in one year. (Code:SM-B-01Y) BI-RADS CATEGORY  1: Negative. Electronically Signed   By: Marin Olp M.D.   On: 02/01/2022 12:58    ASSESSMENT & PLAN:   Lytic lesion of bone on x-ray # Multiple Lytic Lesion-multiple lytic  lesions noted on the CT scan all throughout the skeletal structures-raising the suspicion for multiple myeloma.  No obvious primary noted; May 2023 mammogram negative for malignancy.  We will also review at the tumor conference.  #Recommend further work-up including CBC CMP; myeloma panel kappa lambda light chain ratio; LDH.  Discussed with the patient's mother over the phone-the significant concerns of multiple myeloma/malignancy.  However I am not sure given patient's mental status if aggressive work-up including [PET scan bone marrow biopsy] is going to add value to her care, especially-as patient is extremely poor candidate for any aggressive therapies.   # Chronic mild thrombocytopenia [2013]/mild leukopenia/neutropenia [2018]-await labs from today.  Previous work-up including hepatitis work-up negative.   # DISPOSITION: # labs- today-ordered- # Follow up TBD--Dr.B  # 40 minutes face-to-face with the patient's mother discussing the above plan of care; more than 50% of time spent on prognosis/ natural history; counseling and coordination.    All questions were answered.  The patient knows to call the clinic with any problems, questions or concerns.    Cammie Sickle, MD 02/04/2022 2:53 PM

## 2022-02-07 LAB — KAPPA/LAMBDA LIGHT CHAINS
Kappa free light chain: 15.5 mg/L (ref 3.3–19.4)
Kappa, lambda light chain ratio: 1.82 — ABNORMAL HIGH (ref 0.26–1.65)
Lambda free light chains: 8.5 mg/L (ref 5.7–26.3)

## 2022-02-10 ENCOUNTER — Other Ambulatory Visit: Payer: Medicare Other

## 2022-02-10 LAB — MULTIPLE MYELOMA PANEL, SERUM
Albumin SerPl Elph-Mcnc: 3.5 g/dL (ref 2.9–4.4)
Albumin/Glob SerPl: 1.6 (ref 0.7–1.7)
Alpha 1: 0.2 g/dL (ref 0.0–0.4)
Alpha2 Glob SerPl Elph-Mcnc: 0.6 g/dL (ref 0.4–1.0)
B-Globulin SerPl Elph-Mcnc: 0.8 g/dL (ref 0.7–1.3)
Gamma Glob SerPl Elph-Mcnc: 0.6 g/dL (ref 0.4–1.8)
Globulin, Total: 2.2 g/dL (ref 2.2–3.9)
IgA: 33 mg/dL — ABNORMAL LOW (ref 87–352)
IgG (Immunoglobin G), Serum: 621 mg/dL (ref 586–1602)
IgM (Immunoglobulin M), Srm: 29 mg/dL (ref 26–217)
Total Protein ELP: 5.7 g/dL — ABNORMAL LOW (ref 6.0–8.5)

## 2022-02-10 NOTE — Progress Notes (Signed)
Tumor Board Documentation  Roberta Hampton was presented by Dr Rogue Bussing at our Tumor Board on 02/10/2022, which included representatives from medical oncology, surgical, pharmacy, pulmonology, genetics, radiology, pathology, radiation oncology, navigation, research, internal medicine, palliative care.  Roberta Hampton currently presents as a current patient, for discussion with history of the following treatments: active survellience.  Additionally, we reviewed previous medical and familial history, history of present illness, and recent lab results along with all available histopathologic and imaging studies. The tumor board considered available treatment options and made the following recommendations: Biopsy ([place])    The following procedures/referrals were also placed: No orders of the defined types were placed in this encounter.   Clinical Trial Status: not discussed   Staging used: Not Applicable AJCC Staging:       Group: Multiple Lytic lesions on CT   National site-specific guidelines   were discussed with respect to the case.  Tumor board is a meeting of clinicians from various specialty areas who evaluate and discuss patients for whom a multidisciplinary approach is being considered. Final determinations in the plan of care are those of the provider(s). The responsibility for follow up of recommendations given during tumor board is that of the provider.   Today's extended care, comprehensive team conference, Roberta Hampton was not present for the discussion and was not examined.   Multidisciplinary Tumor Board is a multidisciplinary case peer review process.  Decisions discussed in the Multidisciplinary Tumor Board reflect the opinions of the specialists present at the conference without having examined the patient.  Ultimately, treatment and diagnostic decisions rest with the primary provider(s) and the patient.

## 2022-02-14 ENCOUNTER — Telehealth: Payer: Self-pay | Admitting: Internal Medicine

## 2022-02-14 ENCOUNTER — Other Ambulatory Visit: Payer: Self-pay | Admitting: Internal Medicine

## 2022-02-14 DIAGNOSIS — C419 Malignant neoplasm of bone and articular cartilage, unspecified: Secondary | ICD-10-CM

## 2022-02-14 NOTE — Telephone Encounter (Signed)
Patient's imaging reviewed at the tumor conference.  No primary malignancy noted on the work-up/CAT scans.  Recommend PET scan versus bone marrow biopsy.  Discussed with patient's mother Vaughan Basta.  Interested in further work-up i at this time with a PET scan.  Patient has intellectual disability; lives in a group home.  Please schedule a PET scan-follow-up with me 1 to 2 days later- MD: No labs. Dr.B  Thanks, GB

## 2022-02-23 ENCOUNTER — Ambulatory Visit
Admission: RE | Admit: 2022-02-23 | Discharge: 2022-02-23 | Disposition: A | Discharge status: Home / Self Care | Payer: Medicare Other | Source: Ambulatory Visit | Attending: Internal Medicine | Admitting: Internal Medicine

## 2022-02-23 DIAGNOSIS — C419 Malignant neoplasm of bone and articular cartilage, unspecified: Secondary | ICD-10-CM | POA: Diagnosis present

## 2022-02-23 DIAGNOSIS — J90 Pleural effusion, not elsewhere classified: Secondary | ICD-10-CM | POA: Diagnosis not present

## 2022-02-23 LAB — GLUCOSE, CAPILLARY: Glucose-Capillary: 66 mg/dL — ABNORMAL LOW (ref 70–99)

## 2022-02-23 MED ORDER — FLUDEOXYGLUCOSE F - 18 (FDG) INJECTION
6.6000 | Freq: Once | INTRAVENOUS | Status: AC | PRN
  Administered 2022-02-23: 7.02 via INTRAVENOUS

## 2022-02-25 ENCOUNTER — Ambulatory Visit: Payer: Medicare Other | Admitting: Internal Medicine

## 2022-02-28 ENCOUNTER — Encounter: Payer: Self-pay | Admitting: Internal Medicine

## 2022-02-28 ENCOUNTER — Inpatient Hospital Stay: Payer: Medicare Other

## 2022-02-28 ENCOUNTER — Inpatient Hospital Stay (HOSPITAL_BASED_OUTPATIENT_CLINIC_OR_DEPARTMENT_OTHER): Payer: Medicare Other | Admitting: Internal Medicine

## 2022-02-28 DIAGNOSIS — D696 Thrombocytopenia, unspecified: Secondary | ICD-10-CM | POA: Diagnosis not present

## 2022-02-28 DIAGNOSIS — M899 Disorder of bone, unspecified: Secondary | ICD-10-CM | POA: Diagnosis not present

## 2022-02-28 NOTE — Progress Notes (Signed)
 PET results

## 2022-03-02 ENCOUNTER — Other Ambulatory Visit: Payer: Self-pay | Admitting: Family Medicine

## 2022-03-02 DIAGNOSIS — Z780 Asymptomatic menopausal state: Secondary | ICD-10-CM

## 2022-03-14 ENCOUNTER — Telehealth: Payer: Self-pay | Admitting: Adult Health

## 2022-03-14 NOTE — Telephone Encounter (Signed)
Rescheduled 9/21 appointment with pt over the phone - NP out

## 2022-03-29 ENCOUNTER — Ambulatory Visit
Admission: RE | Admit: 2022-03-29 | Discharge: 2022-03-29 | Disposition: A | Discharge status: Home / Self Care | Payer: Medicare Other | Source: Ambulatory Visit | Attending: Family Medicine | Admitting: Family Medicine

## 2022-03-29 DIAGNOSIS — Z780 Asymptomatic menopausal state: Secondary | ICD-10-CM | POA: Insufficient documentation

## 2022-04-22 ENCOUNTER — Telehealth (INDEPENDENT_AMBULATORY_CARE_PROVIDER_SITE_OTHER): Payer: Medicare Other | Admitting: Adult Health

## 2022-04-22 DIAGNOSIS — R569 Unspecified convulsions: Secondary | ICD-10-CM | POA: Diagnosis not present

## 2022-04-22 NOTE — Progress Notes (Signed)
PATIENT: Roberta Hampton DOB: 21-Jan-1966  REASON FOR VISIT: follow up HISTORY FROM: patient  Virtual Visit via Video Note  I connected with Roberta Hampton on 04/22/22 at  8:45 AM EDT by a video enabled telemedicine application located remotely at [facility] and verified that I am speaking with the correct person using two identifiers who was located at their own home.   I discussed the limitations of evaluation and management by telemedicine and the availability of in person appointments. The patient expressed understanding and agreed to proceed.   PATIENT: Roberta Hampton DOB: [birth year]  REASON FOR VISIT: follow up HISTORY FROM: patient  HISTORY OF PRESENT ILLNESS: Today 04/22/22  Ms. Roberta Hampton is 56 year old female with a history of seizures and mental retardation.  She joins me today for virtual visit with her caregiver.  They report no seizures.  She is being evaluated for bone lesions.  Remains on Depakote solution.  05/20/21: Ms. Roberta Hampton is a 56 year old female with a history of seizures and mental retardation.  She returns today for follow-up.  Caregiver denies any seizure events.  She remains on Depakote.  Med list reviewed from group home.  Denies any new issues.  Returns today for follow-up  05/14/20 Ms. Roberta Hampton is a 56 year old female with a history of seizures and mental retardation.  She is here today for follow-up.  She is here with her caregiver.  They deny any seizure events.  She remains on Depakote.  Returns today for an evaluation.  HISTORY 05/08/19:   Ms. Roberta Hampton is a 56 year old female with a history of mental retardation and seizures.  She returns today for follow-up.  She is here today with caregiver.  The caregiver reports that she is not had any seizure events.  Denies any falls.  She continues on Depakote.  Denies any new symptoms.  She returns today for an evaluation. HISTORY   REVIEW OF SYSTEMS: Out of a complete 14 system review of  symptoms, the patient complains only of the following symptoms, and all other reviewed systems are negative.  ALLERGIES: No Known Allergies  HOME MEDICATIONS: Outpatient Medications Prior to Visit  Medication Sig Dispense Refill   Calcium Carb-Cholecalciferol (CALCIUM 600 + D PO) Take 600 mg by mouth 2 (two) times daily.     CHILDRENS LORATADINE 5 MG/5ML syrup Take by mouth.     clotrimazole (LOTRIMIN) 1 % cream      cyanocobalamin 2000 MCG tablet Take 2,500 mcg by mouth daily.     Docusate Sodium 150 MG/15ML syrup Take 50 mg by mouth 2 (two) times daily.     fluvoxaMINE (LUVOX) 25 MG tablet      folic acid (FOLVITE) 1 MG tablet Take 1 mg by mouth daily.     guaiFENesin (ROBITUSSIN) 100 MG/5ML SOLN Take 5 mLs by mouth every 4 (four) hours as needed for cough or to loosen phlegm.     Multiple Vitamin (THEREMS PO) Take 1 tablet by mouth daily.     MYRBETRIQ 25 MG TB24 tablet TAKE 1 TABLET BY MOUTH ONCE DAILY *DO NOT CRUSH OR CHEW* 90 tablet 3   Nutritional Supplements (ENSURE) POWD Take by mouth 2 (two) times daily.     polyethylene glycol (MIRALAX / GLYCOLAX) packet Take 17 g by mouth daily.     PREMARIN vaginal cream      QUEtiapine (SEROQUEL) 400 MG tablet Take 800 mg by mouth at bedtime.      risperidone (RISPERDAL) 4  MG tablet Take 4 mg by mouth at bedtime.     UNABLE TO FIND      valproic acid (DEPAKENE) 250 MG/5ML solution TAKE 10MLS ('500MG'$ ) BY MOUTH EVERY MORNING;TAKE 7.5MLS ('375MG'$ ) BY MOUTH EVERY NIGHT AT BEDTIME 525 mL 10   vitamin E 200 UNIT capsule Take 200 Units by mouth daily.     No facility-administered medications prior to visit.    PAST MEDICAL HISTORY: Past Medical History:  Diagnosis Date   Attention deficit disorder    Hyponatremia    associated with polydipsia   Leukopenia    Mental retardation    Obesity    Onychomycosis    Seizure disorder ([place])    Seizures (HCC)    Thrombocytopenia (HCC)     PAST SURGICAL HISTORY: Past Surgical History:  Procedure  Laterality Date   ABDOMINAL HYSTERECTOMY     ORIF ANKLE FRACTURE Left 05/08/2015   Procedure: OPEN REDUCTION INTERNAL FIXATION (ORIF) ANKLE FRACTURE;  Surgeon: Corky Mull, MD;  Location: [facility] ORS;  Service: Orthopedics;  Laterality: Left;   VAGINAL HYSTERECTOMY      FAMILY HISTORY: Family History  Problem Relation Age of Onset   Asthma Brother    Seizures Other        paternal cousin   Breast cancer Neg Hx     SOCIAL HISTORY: Social History   Socioeconomic History   Marital status: Single    Spouse name: Not on file   Number of children: 0   Years of education: Not on file   Highest education level: Not on file  Occupational History   Not on file  Tobacco Use   Smoking status: Never   Smokeless tobacco: Never  Vaping Use   Vaping Use: Never used  Substance and Sexual Activity   Alcohol use: No   Drug use: No   Sexual activity: Not on file  Other Topics Concern   Not on file  Social History Narrative   Patient is single and lives in a group home; in [place] [> 25 years] Patient does not have any children; Patient is right handed;Patient drinks caffeine daily. No smoking or alcohol. Mom lives in [place].    Social Determinants of Health   Financial Resource Strain: Not on file  Food Insecurity: Not on file  Transportation Needs: Not on file  Physical Activity: Not on file  Stress: Not on file  Social Connections: Not on file  Intimate Partner Violence: Not on file      PHYSICAL EXAM Generalized: Well developed, in no acute distress   Neurological examination  Mentation: Alert. speech slightly limited d/t MR Reflexes: UTA  DIAGNOSTIC DATA (LABS, IMAGING, TESTING) - I reviewed patient records, labs, notes, testing and imaging myself where available.  Lab Results  Component Value Date   WBC 3.3 (L) 02/04/2022   HGB 13.0 02/04/2022   HCT 38.7 02/04/2022   MCV 99.2 02/04/2022   PLT 96 (L) 02/04/2022      Component Value Date/Time   NA 137  02/04/2022 1451   NA 140 05/08/2019 0936   NA 143 11/13/2011 2004   K 3.6 02/04/2022 1451   K 3.8 11/13/2011 2004   CL 99 02/04/2022 1451   CL 105 11/13/2011 2004   CO2 29 02/04/2022 1451   CO2 26 11/13/2011 2004   GLUCOSE 110 (H) 02/04/2022 1451   GLUCOSE 96 11/13/2011 2004   BUN 19 02/04/2022 1451   BUN 16 05/08/2019 0936   BUN 17 11/13/2011 2004  CREATININE 0.64 02/04/2022 1451   CREATININE 0.58 (L) 11/13/2011 2004   CALCIUM 9.1 02/04/2022 1451   CALCIUM 9.0 11/13/2011 2004   PROT 6.2 (L) 02/04/2022 1451   PROT 5.0 (L) 05/08/2019 0936   PROT 6.4 11/13/2011 2004   ALBUMIN 3.5 02/04/2022 1451   ALBUMIN 3.4 (L) 05/08/2019 0936   ALBUMIN 3.5 11/13/2011 2004   AST 14 (L) 02/04/2022 1451   AST 18 11/13/2011 2004   ALT 12 02/04/2022 1451   ALT 26 11/13/2011 2004   ALKPHOS 67 02/04/2022 1451   ALKPHOS 91 11/13/2011 2004   BILITOT 0.5 02/04/2022 1451   BILITOT 0.3 05/08/2019 0936   BILITOT 0.7 11/13/2011 2004   GFRNONAA >60 02/04/2022 1451   GFRNONAA >60 11/13/2011 2004   GFRAA 114 05/08/2019 0936   GFRAA >60 11/13/2011 2004       ASSESSMENT AND PLAN 56 y.o. year old female  has a past medical history of Attention deficit disorder, Hyponatremia, Leukopenia, Mental retardation, Obesity, Onychomycosis, Seizure disorder ([place]), Seizures ([place]), and Thrombocytopenia ([place]). here with :  Seizure  - Continue Depakote solution 500 mg in the AM and 375 mg at bedtime -Advised if she has any seizure events she should let us know -Follow-up in 6 months or sooner if needed     Ward Givens, MSN, NP-C 04/22/2022, 8:42 AM [facility] [address] [place], [place] [address] [phone]

## 2022-05-04 ENCOUNTER — Other Ambulatory Visit: Payer: Self-pay | Admitting: Adult Health

## 2022-05-26 ENCOUNTER — Ambulatory Visit: Payer: Medicare Other | Admitting: Adult Health

## 2022-06-23 ENCOUNTER — Inpatient Hospital Stay
Admission: EM | Admit: 2022-06-23 | Discharge: 2022-07-04 | DRG: 438 | Disposition: A | Discharge status: Hospice | Payer: Medicare Other | Attending: Student in an Organized Health Care Education/Training Program | Admitting: Student in an Organized Health Care Education/Training Program

## 2022-06-23 ENCOUNTER — Other Ambulatory Visit: Payer: Self-pay

## 2022-06-23 ENCOUNTER — Emergency Department: Payer: Medicare Other

## 2022-06-23 ENCOUNTER — Inpatient Hospital Stay: Payer: Medicare Other

## 2022-06-23 DIAGNOSIS — D696 Thrombocytopenia, unspecified: Secondary | ICD-10-CM | POA: Diagnosis present

## 2022-06-23 DIAGNOSIS — R6250 Unspecified lack of expected normal physiological development in childhood: Secondary | ICD-10-CM | POA: Diagnosis present

## 2022-06-23 DIAGNOSIS — G40319 Generalized idiopathic epilepsy and epileptic syndromes, intractable, without status epilepticus: Secondary | ICD-10-CM | POA: Diagnosis not present

## 2022-06-23 DIAGNOSIS — F32A Depression, unspecified: Secondary | ICD-10-CM

## 2022-06-23 DIAGNOSIS — K769 Liver disease, unspecified: Secondary | ICD-10-CM | POA: Diagnosis present

## 2022-06-23 DIAGNOSIS — E878 Other disorders of electrolyte and fluid balance, not elsewhere classified: Secondary | ICD-10-CM | POA: Diagnosis present

## 2022-06-23 DIAGNOSIS — F909 Attention-deficit hyperactivity disorder, unspecified type: Secondary | ICD-10-CM | POA: Diagnosis present

## 2022-06-23 DIAGNOSIS — Z66 Do not resuscitate: Secondary | ICD-10-CM | POA: Diagnosis present

## 2022-06-23 DIAGNOSIS — K831 Obstruction of bile duct: Secondary | ICD-10-CM | POA: Diagnosis not present

## 2022-06-23 DIAGNOSIS — R17 Unspecified jaundice: Secondary | ICD-10-CM | POA: Diagnosis not present

## 2022-06-23 DIAGNOSIS — K8050 Calculus of bile duct without cholangitis or cholecystitis without obstruction: Principal | ICD-10-CM

## 2022-06-23 DIAGNOSIS — F72 Severe intellectual disabilities: Secondary | ICD-10-CM | POA: Diagnosis present

## 2022-06-23 DIAGNOSIS — G40419 Other generalized epilepsy and epileptic syndromes, intractable, without status epilepticus: Secondary | ICD-10-CM | POA: Diagnosis present

## 2022-06-23 DIAGNOSIS — Z825 Family history of asthma and other chronic lower respiratory diseases: Secondary | ICD-10-CM

## 2022-06-23 DIAGNOSIS — K3189 Other diseases of stomach and duodenum: Secondary | ICD-10-CM | POA: Diagnosis not present

## 2022-06-23 DIAGNOSIS — K7689 Other specified diseases of liver: Secondary | ICD-10-CM | POA: Diagnosis not present

## 2022-06-23 DIAGNOSIS — E871 Hypo-osmolality and hyponatremia: Secondary | ICD-10-CM | POA: Diagnosis present

## 2022-06-23 DIAGNOSIS — K851 Biliary acute pancreatitis without necrosis or infection: Secondary | ICD-10-CM | POA: Diagnosis present

## 2022-06-23 DIAGNOSIS — K869 Disease of pancreas, unspecified: Secondary | ICD-10-CM | POA: Diagnosis not present

## 2022-06-23 DIAGNOSIS — Z515 Encounter for palliative care: Secondary | ICD-10-CM

## 2022-06-23 LAB — CBC WITH DIFFERENTIAL/PLATELET
Abs Immature Granulocytes: 0.14 10*3/uL — ABNORMAL HIGH (ref 0.00–0.07)
Basophils Absolute: 0.1 10*3/uL (ref 0.0–0.1)
Basophils Relative: 1 %
Eosinophils Absolute: 0.1 10*3/uL (ref 0.0–0.5)
Eosinophils Relative: 2 %
HCT: 41.6 % (ref 36.0–46.0)
Hemoglobin: 13.8 g/dL (ref 12.0–15.0)
Immature Granulocytes: 3 %
Lymphocytes Relative: 34 %
Lymphs Abs: 1.5 10*3/uL (ref 0.7–4.0)
MCH: 32.1 pg (ref 26.0–34.0)
MCHC: 33.2 g/dL (ref 30.0–36.0)
MCV: 96.7 fL (ref 80.0–100.0)
Monocytes Absolute: 0.7 10*3/uL (ref 0.1–1.0)
Monocytes Relative: 16 %
Neutro Abs: 1.9 10*3/uL (ref 1.7–7.7)
Neutrophils Relative %: 44 %
Platelets: 126 10*3/uL — ABNORMAL LOW (ref 150–400)
RBC: 4.3 MIL/uL (ref 3.87–5.11)
RDW: 14.6 % (ref 11.5–15.5)
WBC: 4.3 10*3/uL (ref 4.0–10.5)
nRBC: 0 % (ref 0.0–0.2)

## 2022-06-23 LAB — COMPREHENSIVE METABOLIC PANEL
ALT: 94 U/L — ABNORMAL HIGH (ref 0–44)
AST: 89 U/L — ABNORMAL HIGH (ref 15–41)
Albumin: 3.2 g/dL — ABNORMAL LOW (ref 3.5–5.0)
Alkaline Phosphatase: 547 U/L — ABNORMAL HIGH (ref 38–126)
Anion gap: 9 (ref 5–15)
BUN: 18 mg/dL (ref 6–20)
CO2: 28 mmol/L (ref 22–32)
Calcium: 9.2 mg/dL (ref 8.9–10.3)
Chloride: 97 mmol/L — ABNORMAL LOW (ref 98–111)
Creatinine, Ser: 0.37 mg/dL — ABNORMAL LOW (ref 0.44–1.00)
GFR, Estimated: >60 mL/min (ref >60)
Glucose, Bld: 70 mg/dL (ref 70–99)
Potassium: 4.2 mmol/L (ref 3.5–5.1)
Sodium: 134 mmol/L — ABNORMAL LOW (ref 135–145)
Total Bilirubin: 10 mg/dL — ABNORMAL HIGH (ref 0.3–1.2)
Total Protein: 6.4 g/dL — ABNORMAL LOW (ref 6.5–8.1)

## 2022-06-23 LAB — URINALYSIS, COMPLETE (UACMP) WITH MICROSCOPIC
Bacteria, UA: NONE SEEN
Glucose, UA: NEGATIVE mg/dL
Hgb urine dipstick: NEGATIVE
Ketones, ur: 5 mg/dL — AB
Nitrite: NEGATIVE
Protein, ur: NEGATIVE mg/dL
Specific Gravity, Urine: 1.005 (ref 1.005–1.030)
pH: 5 (ref 5.0–8.0)

## 2022-06-23 LAB — LIPASE, BLOOD: Lipase: 393 U/L — ABNORMAL HIGH (ref 11–51)

## 2022-06-23 MED ORDER — RISPERIDONE 1 MG PO TABS
2.0000 mg | ORAL_TABLET | Freq: Two times a day (BID) | ORAL | Status: DC
  Administered 2022-06-24 – 2022-07-04 (×20): 2 mg via ORAL
  Filled 2022-06-23 (×22): qty 2

## 2022-06-23 MED ORDER — VITAMIN E 45 MG (100 UNIT) PO CAPS
200.0000 [IU] | ORAL_CAPSULE | Freq: Every day | ORAL | Status: DC
  Administered 2022-06-27 – 2022-07-03 (×6): 200 [IU] via ORAL
  Filled 2022-06-23 (×10): qty 2

## 2022-06-23 MED ORDER — ENSURE ENLIVE PO LIQD
Freq: Two times a day (BID) | ORAL | Status: DC
  Administered 2022-06-30 – 2022-07-04 (×3): 237 mL via ORAL

## 2022-06-23 MED ORDER — FOLIC ACID 1 MG PO TABS
1.0000 mg | ORAL_TABLET | Freq: Every day | ORAL | Status: DC
  Administered 2022-06-27 – 2022-07-04 (×7): 1 mg via ORAL
  Filled 2022-06-23 (×9): qty 1

## 2022-06-23 MED ORDER — SODIUM CHLORIDE 0.9 % IV SOLN: INTRAVENOUS | Status: DC

## 2022-06-23 MED ORDER — ESTROGENS CONJUGATED 0.625 MG/GM VA CREA: 1.0000 | TOPICAL_CREAM | Freq: Every day | VAGINAL | Status: DC

## 2022-06-23 MED ORDER — LORAZEPAM 2 MG/ML IJ SOLN
2.0000 mg | Freq: Once | INTRAMUSCULAR | Status: AC
  Administered 2022-06-23: 2 mg via INTRAMUSCULAR
  Filled 2022-06-23: qty 1

## 2022-06-23 MED ORDER — ENOXAPARIN SODIUM 40 MG/0.4ML IJ SOSY
40.0000 mg | PREFILLED_SYRINGE | INTRAMUSCULAR | Status: DC
  Administered 2022-06-24 – 2022-07-04 (×10): 40 mg via SUBCUTANEOUS
  Filled 2022-06-23 (×11): qty 0.4

## 2022-06-23 MED ORDER — ONDANSETRON HCL 4 MG PO TABS: 4.0000 mg | ORAL_TABLET | Freq: Four times a day (QID) | ORAL | Status: DC | PRN

## 2022-06-23 MED ORDER — VALPROIC ACID 250 MG/5ML PO SOLN: 500.0000 mg | ORAL | Status: DC

## 2022-06-23 MED ORDER — POLYETHYLENE GLYCOL 3350 17 G PO PACK
17.0000 g | PACK | Freq: Every day | ORAL | Status: DC
  Administered 2022-06-26 – 2022-07-04 (×7): 17 g via ORAL
  Filled 2022-06-23 (×9): qty 1

## 2022-06-23 MED ORDER — TRAZODONE HCL 50 MG PO TABS
25.0000 mg | ORAL_TABLET | Freq: Every evening | ORAL | Status: DC | PRN
  Administered 2022-06-30 – 2022-07-02 (×2): 25 mg via ORAL
  Filled 2022-06-23 (×2): qty 1

## 2022-06-23 MED ORDER — MIRABEGRON ER 25 MG PO TB24
25.0000 mg | ORAL_TABLET | Freq: Every day | ORAL | Status: DC
  Administered 2022-06-27 – 2022-07-04 (×8): 25 mg via ORAL
  Filled 2022-06-23 (×11): qty 1

## 2022-06-23 MED ORDER — ONDANSETRON HCL 4 MG/2ML IJ SOLN: 4.0000 mg | Freq: Four times a day (QID) | INTRAMUSCULAR | Status: DC | PRN

## 2022-06-23 MED ORDER — LORAZEPAM 2 MG/ML IJ SOLN: 1.5000 mg | Freq: Once | INTRAMUSCULAR | Status: DC

## 2022-06-23 MED ORDER — QUETIAPINE FUMARATE 300 MG PO TABS
800.0000 mg | ORAL_TABLET | Freq: Every day | ORAL | Status: DC
  Administered 2022-06-24 – 2022-07-03 (×11): 800 mg via ORAL
  Filled 2022-06-23 (×12): qty 1

## 2022-06-23 MED ORDER — VITAMIN B-12 1000 MCG PO TABS
2000.0000 ug | ORAL_TABLET | Freq: Every day | ORAL | Status: DC
  Administered 2022-06-27 – 2022-07-04 (×7): 2000 ug via ORAL
  Filled 2022-06-23 (×10): qty 2

## 2022-06-23 MED ORDER — ACETAMINOPHEN 650 MG RE SUPP: 650.0000 mg | Freq: Four times a day (QID) | RECTAL | Status: DC | PRN

## 2022-06-23 MED ORDER — MAGNESIUM HYDROXIDE 400 MG/5ML PO SUSP
30.0000 mL | Freq: Every day | ORAL | Status: DC | PRN
  Administered 2022-06-25: 30 mL via ORAL
  Filled 2022-06-23: qty 30

## 2022-06-23 MED ORDER — DOCUSATE SODIUM 50 MG/5ML PO LIQD
50.0000 mg | Freq: Two times a day (BID) | ORAL | Status: DC
  Administered 2022-06-24 – 2022-06-28 (×7): 50 mg via ORAL
  Filled 2022-06-23 (×12): qty 10

## 2022-06-23 MED ORDER — OYSTER SHELL CALCIUM/D3 500-5 MG-MCG PO TABS
ORAL_TABLET | Freq: Two times a day (BID) | ORAL | Status: DC
  Administered 2022-06-25 – 2022-07-04 (×12): 1 via ORAL
  Filled 2022-06-23 (×15): qty 1

## 2022-06-23 MED ORDER — ADULT MULTIVITAMIN W/MINERALS CH
ORAL_TABLET | Freq: Every day | ORAL | Status: DC
  Administered 2022-06-27 – 2022-07-03 (×6): 1 via ORAL
  Filled 2022-06-23 (×8): qty 1

## 2022-06-23 MED ORDER — ACETAMINOPHEN 325 MG PO TABS
650.0000 mg | ORAL_TABLET | Freq: Four times a day (QID) | ORAL | Status: DC | PRN
  Administered 2022-06-25 – 2022-07-02 (×3): 650 mg via ORAL
  Filled 2022-06-23 (×3): qty 2

## 2022-06-23 NOTE — ED Provider Notes (Signed)
[facility] Provider Note    Event Date/Time   First MD Initiated Contact with Patient 06/23/22 2025     (approximate)  History   Chief Complaint: Abnormal Labs  HPI  SHAYLEE STANISLAWSKI is a 56 y.o. female with a past medical history of hyponatremia, seizures, ADHD, mental retardation, presents to the emergency department for abnormal lab work.  According to the patient's caregivers they noted this week that the patient had yellowing of her skin and they saw the patient's doctor who did lab work they were called back today saying her labs were abnormal and she should go to the emergency department.  Here the patient has mental retardation and cannot give a history.  She does appear well and pleasant in no discomfort or distress on my exam.  Physical Exam   Triage Vital Signs: ED Triage Vitals [06/23/22 1834]  Enc Vitals Group     BP 112/80     Pulse Rate 81     Resp 17     Temp 97.7 F (36.5 C)     Temp Source Oral     SpO2 97 %     Weight      Height      Head Circumference      Peak Flow      Pain Score 0     Pain Loc      Pain Edu?      Excl. in [place]?     Most recent vital signs: Vitals:   06/23/22 1834  BP: 112/80  Pulse: 81  Resp: 17  Temp: 97.7 F (36.5 C)  SpO2: 97%    General: Awake, no distress.  Scleral icterus with jaundice skin. CV:  Good peripheral perfusion.  Regular rate and rhythm Resp:  Normal effort.  Equal breath sounds bilaterally.  Abd:  No distention.  Soft, nontender.  No rebound or guarding.  Benign abdomen.   ED Results / Procedures / Treatments   RADIOLOGY  I reviewed and interpreted the ultrasound images patient appears to have a dilated gallbladder as well as a dilated CBD at 0.88, likely indicating choledocholithiasis awaiting radiology read.   MEDICATIONS ORDERED IN ED: Medications - No data to display   IMPRESSION / MDM / [place] / ED COURSE  I reviewed the triage vital signs and the  nursing notes.  Patient's presentation is most consistent with acute presentation with potential threat to life or bodily function.  Patient presents emergency department for abnormal lab work.  Patient does have jaundice skin with scleral icterus on my examination.  Patient's lab work shows a reassuring CBC however on her chemistry her LFTs are elevated including a total bilirubin of 10.0.  Lipase is elevated 393 concerning for a choledocholithiasis.  We will obtain a right upper quadrant ultrasound to further evaluate.  Patient's right upper quadrant ultrasound on my evaluation appears to have a dilated CBD  Ultrasound has resulted showing dilated gallbladder and CBD however they do state also was dilated in June 2023.  However the LFT elevation is normal now with new jaundice elevated lipase and LFTs with a dilated common bile duct highly suspect choledocholithiasis.  I spoke to Dr. Marius Ditch of GI.  She request an MRCP which we will get shortly.  She will discuss with Dr. Allen Norris for ERCP availability.  Dr. Marius Ditch says Dr. Allen Norris is available for ERCP if needed tomorrow.  I have ordered the MRCP.  Patient is not holding still for  the MRCP we will dose a small dose of IM Ativan to help the patient relax for MRI.  Patient will be admitted to the hospital service for further work-up and treatment.  FINAL CLINICAL IMPRESSION(S) / ED DIAGNOSES   Gallstone pancreatitis Choledocholithiasis   Note:  This document was prepared using Dragon voice recognition software and may include unintentional dictation errors.   Harvest Dark, MD 06/23/22 2320

## 2022-06-23 NOTE — Assessment & Plan Note (Signed)
-   This is apparently associated with behavioral changes and agitation. - We will continue Luvox, Seroquel and Risperdal.

## 2022-06-23 NOTE — Assessment & Plan Note (Addendum)
-   This could be secondary to choledocholithiasis or biliary mass. - Management as above.

## 2022-06-23 NOTE — Assessment & Plan Note (Deleted)
-   The patient will be admitted to a medical telemetry bed. - We will keep her n.p.o. after midnight. - MRCP was ordered and will be followed. - We will follow LFTs with hydration. - GI consultation will be obtained. - Dr. Marius Ditch was notified about the patient. - Dr. Allen Norris will be available tomorrow for potential need for ERCP.

## 2022-06-23 NOTE — Assessment & Plan Note (Signed)
-   The patient will be admitted to a medical telemetry bed. - We will keep her n.p.o. after midnight. - MRCP was ordered to assess for obstruction by stone or mass and will be followed. - We will follow LFTs with hydration. - GI consultation will be obtained. - Dr. Marius Ditch was notified about the patient. - Dr. Allen Norris will be available tomorrow for potential need for ERCP.

## 2022-06-23 NOTE — ED Triage Notes (Signed)
Pt presents to ED with /co of being sent by PCP for abnormal liver labs. Pt does have some yellow tinge to her skin. Caregiver states labs were taken a few days ago due to change ib skin color. Pt does not drink alcohol. Pt denies any pain.

## 2022-06-23 NOTE — Assessment & Plan Note (Signed)
-   We will continue Depakote.

## 2022-06-23 NOTE — ED Provider Triage Note (Signed)
Emergency Medicine Provider Triage Evaluation Note  Roberta Hampton , a 56 y.o. female  was evaluated in triage.  Pt complains of abdominal pain.  No nausea or vomiting.  Was sent by PCP today for abnormal blood work, unable to determine lab abnormalities, unable to see any lab work in the system.  No painful urination.  Review of Systems  Positive: Abnormal blood work today [place] office, abdominal pain Negative: Chest pain, shortness of breath  Physical Exam  BP 112/80 (BP Location: Right Arm)   Pulse 81   Temp 97.7 F (36.5 C) (Oral)   Resp 17   SpO2 97%  Gen:   Awake, no distress   Resp:  Normal effort  MSK:   Moves extremities without difficulty  Other:    Medical Decision Making  Medically screening exam initiated at 6:49 PM.  Appropriate orders placed.  Roberta Hampton was informed that the remainder of the evaluation will be completed by another provider, this initial triage assessment does not replace that evaluation, and the importance of remaining in the ED until their evaluation is complete.     Duanne Guess, [place] 06/23/22 [phone]

## 2022-06-23 NOTE — Assessment & Plan Note (Addendum)
-   We will continue Seroquel and Risperdal. - She will need Ativan before her MRCP.

## 2022-06-23 NOTE — H&P (Addendum)
[place]   PATIENT NAME: Roberta Hampton    MR#:  [id number]  DATE OF BIRTH:  07-05-1966  DATE OF ADMISSION:  06/23/2022  PRIMARY CARE PHYSICIAN: [facility] [place]   Patient is coming from: Home  REQUESTING/REFERRING PHYSICIAN: Harvest Dark, MD  CHIEF COMPLAINT:   Chief Complaint  Patient presents with   Abnormal Labs    HISTORY OF PRESENT ILLNESS:  Roberta Hampton is a 56 y.o. Caucasian female with medical history significant for seizure disorder, ADD, MR with developmental delay, who presented to the emergency room with acute onset of jaundice and abnormal labs.  The patient was apparently seen by her primary care provider this week for significant jaundice and had labs yesterday.  She was called today for elevated LFTs and bilirubin to come to the ER.  The patient had no reported fever or chills per her caregiver.  No reported nausea or vomiting or abdominal pain.  No dysuria, oliguria, urinary frequency or urgency or flank pain.  She had dark tea colored urine without hematuria.  No reported chest pain or palpitations.  She was seen for UTI on 06/15/2022 and was given Macrobid for diagnosed UTI.  ED Course: When she came to the ER, vital signs were within normal.  Labs revealed mild hyponatremia and hypochloremia and alk phos of 547 with albumin of 3.2 total protein of 6.4, total bili of 10, AST of 89 ALT of 94 all above previous normal levels in June.  CBC showed thrombocytopenia 126 and was otherwise unremarkable..  Imaging:  Right upper quadrant ultrasound revealed distended gallbladder with dilatation of common bile duct measuring 9 mm which is grossly similar to PET/CT on 02/23/2022 given differences in technique.  There was no evidence of cholecystitis.  There was recommendation to consider MRCP.  The patient was given 1.5 mg of IV Ativan in the ER.  She will be admitted to a medical telemetry bed for further evaluation and management. PAST  MEDICAL HISTORY:   Past Medical History:  Diagnosis Date   Attention deficit disorder    Hyponatremia    associated with polydipsia   Leukopenia    Mental retardation    Obesity    Onychomycosis    Seizure disorder ([place])    Seizures (HCC)    Thrombocytopenia (HCC)     PAST SURGICAL HISTORY:   Past Surgical History:  Procedure Laterality Date   ABDOMINAL HYSTERECTOMY     ORIF ANKLE FRACTURE Left 05/08/2015   Procedure: OPEN REDUCTION INTERNAL FIXATION (ORIF) ANKLE FRACTURE;  Surgeon: Corky Mull, MD;  Location: [facility] ORS;  Service: Orthopedics;  Laterality: Left;   VAGINAL HYSTERECTOMY      SOCIAL HISTORY:   Social History   Tobacco Use   Smoking status: Never   Smokeless tobacco: Never  Substance Use Topics   Alcohol use: No    FAMILY HISTORY:   Family History  Problem Relation Age of Onset   Asthma Brother    Seizures Other        paternal cousin   Breast cancer Neg Hx     DRUG ALLERGIES:  No Known Allergies  REVIEW OF SYSTEMS:   ROS As per history of present illness. All pertinent systems were reviewed above. Constitutional, HEENT, cardiovascular, respiratory, GI, GU, musculoskeletal, neuro, psychiatric, endocrine, integumentary and hematologic systems were reviewed and are otherwise negative/unremarkable except for positive findings mentioned above in the HPI.   MEDICATIONS AT HOME:   Prior to Admission medications  Medication Sig Start Date End Date Taking? Authorizing Provider  Calcium Carb-Cholecalciferol (CALCIUM 600 + D PO) Take 600 mg by mouth 2 (two) times daily.    [provider]  CHILDRENS LORATADINE 5 MG/5ML syrup Take by mouth. 05/12/20   [provider]  clotrimazole (LOTRIMIN) 1 % cream  11/15/18   [provider]  cyanocobalamin 2000 MCG tablet Take 2,500 mcg by mouth daily.    [provider]  Docusate Sodium 150 MG/15ML syrup Take 50 mg by mouth 2 (two) times daily.    [provider]   fluvoxaMINE (LUVOX) 25 MG tablet  01/04/19   [provider]  folic acid (FOLVITE) 1 MG tablet Take 1 mg by mouth daily. 05/11/20   [provider]  guaiFENesin (ROBITUSSIN) 100 MG/5ML SOLN Take 5 mLs by mouth every 4 (four) hours as needed for cough or to loosen phlegm.    [provider]  Multiple Vitamin (THEREMS PO) Take 1 tablet by mouth daily.    [provider]  MYRBETRIQ 25 MG TB24 tablet TAKE 1 TABLET BY MOUTH ONCE DAILY *DO NOT CRUSH OR CHEW* 05/31/21   Zara Council A, PA-C  Nutritional Supplements (ENSURE) POWD Take by mouth 2 (two) times daily. 05/11/20   [provider]  polyethylene glycol (MIRALAX / GLYCOLAX) packet Take 17 g by mouth daily.    [provider]  PREMARIN vaginal cream  10/30/18   [provider]  QUEtiapine (SEROQUEL) 400 MG tablet Take 800 mg by mouth at bedtime.     [provider]  risperidone (RISPERDAL) 4 MG tablet Take 4 mg by mouth at bedtime. 06/01/19   [provider]  UNABLE TO FIND  10/01/18   [provider]  valproic acid (DEPAKENE) 250 MG/5ML solution TAKE 10MLS (500MG ) BY MOUTH ONCE DAILY IN THE MORNING;TAKE 7.5MLS (375MG ) BY MOUTH EVERY NIGHT AT BEDTIME 05/05/22   Ward Givens, NP  vitamin E 200 UNIT capsule Take 200 Units by mouth daily.    [provider]      VITAL SIGNS:  Blood pressure 112/80, pulse 81, temperature 97.7 F (36.5 C), temperature source Oral, resp. rate 17, SpO2 97 %.  PHYSICAL EXAMINATION:  Physical Exam  GENERAL:  56 y.o.-year-old patient lying in the bed with no acute distress.  EYES: Pupils equal, round, reactive to light and accommodation.  Positive scleral icterus.  Extraocular muscles intact.  HEENT: Head atraumatic, normocephalic. Oropharynx and nasopharynx clear.  NECK:  Supple, no jugular venous distention. No thyroid enlargement, no tenderness.  LUNGS: Normal breath sounds bilaterally, no wheezing, rales,rhonchi or  crepitation. No use of accessory muscles of respiration.  CARDIOVASCULAR: Regular rate and rhythm, S1, S2 normal. No murmurs, rubs, or gallops.  ABDOMEN: Soft, nondistended, nontender. Bowel sounds present. No organomegaly or mass.  EXTREMITIES: No pedal edema, cyanosis, or clubbing.  NEUROLOGIC: Cranial nerves II through XII are intact. Muscle strength 5/5 in all extremities. Sensation intact. Gait not checked.  PSYCHIATRIC: The patient is alert and oriented x 3.  Normal affect and good eye contact. SKIN: Yellowish discoloration with otherwise no rash lesion, or ulcer.   LABORATORY PANEL:   CBC Recent Labs  Lab 06/23/22 1839  WBC 4.3  HGB 13.8  HCT 41.6  PLT 126*   ------------------------------------------------------------------------------------------------------------------  Chemistries  Recent Labs  Lab 06/23/22 1839  NA 134*  K 4.2  CL 97*  CO2 28  GLUCOSE 70  BUN 18  CREATININE 0.37*  CALCIUM 9.2  AST 89*  ALT 94*  ALKPHOS 547*  BILITOT 10.0*   ------------------------------------------------------------------------------------------------------------------  Cardiac Enzymes No results for input(s): "TROPONINI" in the last 168 hours. ------------------------------------------------------------------------------------------------------------------  RADIOLOGY:  US ABDOMEN LIMITED RUQ (LIVER/GB)  Result Date: 06/23/2022 CLINICAL DATA:  Elevated LFTs EXAM: ULTRASOUND ABDOMEN LIMITED RIGHT UPPER QUADRANT COMPARISON:  CT 01/06/2022 FINDINGS: Gallbladder: Distended gallbladder. No gallstones or wall thickening visualized. No sonographic Murphy sign noted by sonographer. Common bile duct: Diameter: 9 mm Liver: No focal lesion identified. Within normal limits in parenchymal echogenicity. Portal vein is patent on color Doppler imaging with normal direction of blood flow towards the liver. Other: None. IMPRESSION: Distended gallbladder with dilation of the common bile duct  measuring 9 mm. This is grossly similar to PET/CT 02/23/2022 given differences in technique. Consider MRI/MRCP for further evaluation if there is concern for obstructing stone or mass. No evidence of cholecystitis. Electronically Signed   By: Placido Sou M.D.   On: 06/23/2022 21:25      IMPRESSION AND PLAN:  Assessment and Plan: Obstructive jaundice - This could be secondary to choledocholithiasis or biliary mass. - Management as above.  Acute biliary pancreatitis - The patient will be admitted to a medical telemetry bed. - We will keep her n.p.o. after midnight. - MRCP was ordered to assess for obstruction by stone or mass and will be followed. - We will follow LFTs with hydration. - GI consultation will be obtained. - Dr. Marius Ditch was notified about the patient. - Dr. Allen Norris will be available tomorrow for potential need for ERCP.  Depression - This is apparently associated with behavioral changes and agitation. - We will continue Luvox, Seroquel and Risperdal.  Developmental delay - We will continue Seroquel and Risperdal. - She will need Ativan before her MRCP.  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  DVT prophylaxis: Lovenox.  Advanced Care Planning:  Code Status: full code.  Family Communication:  The plan of care was discussed in details with the patient (and family). I answered all questions. The patient agreed to proceed with the above mentioned plan. Further management will depend upon hospital course. Disposition Plan: Back to previous home environment Consults called: Gastroenterology. All the records are reviewed and case discussed with ED provider.  Status is: Inpatient    At the time of the admission, it appears that the appropriate admission status for this patient is inpatient.  This is judged to be reasonable and necessary in order to provide the required intensity of service to ensure the patient's safety  given the presenting symptoms, physical exam findings and initial radiographic and laboratory data in the context of comorbid conditions.  The patient requires inpatient status due to high intensity of service, high risk of further deterioration and high frequency of surveillance required.  I certify that at the time of admission, it is my clinical judgment that the patient will require inpatient hospital care extending more than 2 midnights.                            Dispo: The patient is from: Home              Anticipated d/c is to: Home              Patient currently is not medically stable to d/c.              Difficult to place patient: No  Christel Mormon M.D on  06/23/2022 at 11:51 PM  [facility]   From 7 PM-7 AM, contact night-coverage [email or web address]  CC: Primary care physician; [facility]

## 2022-06-24 ENCOUNTER — Inpatient Hospital Stay: Payer: Medicare Other

## 2022-06-24 ENCOUNTER — Inpatient Hospital Stay: Payer: Medicare Other | Admitting: Certified Registered"

## 2022-06-24 ENCOUNTER — Encounter
Admission: EM | Disposition: A | Discharge status: Hospice | Payer: Self-pay | Source: Home / Self Care | Attending: Student in an Organized Health Care Education/Training Program

## 2022-06-24 ENCOUNTER — Encounter: Payer: Self-pay | Admitting: Family Medicine

## 2022-06-24 DIAGNOSIS — K851 Biliary acute pancreatitis without necrosis or infection: Secondary | ICD-10-CM | POA: Diagnosis not present

## 2022-06-24 DIAGNOSIS — K3189 Other diseases of stomach and duodenum: Secondary | ICD-10-CM | POA: Diagnosis not present

## 2022-06-24 DIAGNOSIS — K831 Obstruction of bile duct: Secondary | ICD-10-CM | POA: Diagnosis not present

## 2022-06-24 DIAGNOSIS — G40319 Generalized idiopathic epilepsy and epileptic syndromes, intractable, without status epilepticus: Secondary | ICD-10-CM | POA: Diagnosis not present

## 2022-06-24 DIAGNOSIS — R6250 Unspecified lack of expected normal physiological development in childhood: Secondary | ICD-10-CM | POA: Diagnosis not present

## 2022-06-24 HISTORY — PX: ENDOSCOPIC RETROGRADE CHOLANGIOPANCREATOGRAPHY (ERCP) WITH PROPOFOL: SHX5810

## 2022-06-24 LAB — COMPREHENSIVE METABOLIC PANEL
ALT: 76 U/L — ABNORMAL HIGH (ref 0–44)
AST: 75 U/L — ABNORMAL HIGH (ref 15–41)
Albumin: 2.9 g/dL — ABNORMAL LOW (ref 3.5–5.0)
Alkaline Phosphatase: 531 U/L — ABNORMAL HIGH (ref 38–126)
Anion gap: 7 (ref 5–15)
BUN: 11 mg/dL (ref 6–20)
CO2: 26 mmol/L (ref 22–32)
Calcium: 8.8 mg/dL — ABNORMAL LOW (ref 8.9–10.3)
Chloride: 104 mmol/L (ref 98–111)
Creatinine, Ser: <0.3 mg/dL — ABNORMAL LOW (ref 0.44–1.00)
Glucose, Bld: 91 mg/dL (ref 70–99)
Potassium: 3.9 mmol/L (ref 3.5–5.1)
Sodium: 137 mmol/L (ref 135–145)
Total Bilirubin: 10 mg/dL — ABNORMAL HIGH (ref 0.3–1.2)
Total Protein: 5.8 g/dL — ABNORMAL LOW (ref 6.5–8.1)

## 2022-06-24 LAB — CBG MONITORING, ED: Glucose-Capillary: 60 mg/dL — ABNORMAL LOW (ref 70–99)

## 2022-06-24 LAB — CBC
HCT: 44 % (ref 36.0–46.0)
Hemoglobin: 13.9 g/dL (ref 12.0–15.0)
MCH: 31.2 pg (ref 26.0–34.0)
MCHC: 31.6 g/dL (ref 30.0–36.0)
MCV: 98.9 fL (ref 80.0–100.0)
Platelets: 107 10*3/uL — ABNORMAL LOW (ref 150–400)
RBC: 4.45 MIL/uL (ref 3.87–5.11)
RDW: 14.7 % (ref 11.5–15.5)
WBC: 3.4 10*3/uL — ABNORMAL LOW (ref 4.0–10.5)
nRBC: 0 % (ref 0.0–0.2)

## 2022-06-24 LAB — HIV ANTIBODY (ROUTINE TESTING W REFLEX): HIV Screen 4th Generation wRfx: NONREACTIVE

## 2022-06-24 SURGERY — ENDOSCOPIC RETROGRADE CHOLANGIOPANCREATOGRAPHY (ERCP) WITH PROPOFOL
Anesthesia: General

## 2022-06-24 MED ORDER — ROCURONIUM BROMIDE 100 MG/10ML IV SOLN
INTRAVENOUS | Status: DC | PRN
  Administered 2022-06-24: 30 mg via INTRAVENOUS

## 2022-06-24 MED ORDER — SENNOSIDES-DOCUSATE SODIUM 8.6-50 MG PO TABS: 1.0000 | ORAL_TABLET | Freq: Every evening | ORAL | Status: DC | PRN

## 2022-06-24 MED ORDER — VALPROIC ACID 250 MG/5ML PO SOLN
375.0000 mg | Freq: Every day | ORAL | Status: DC
  Administered 2022-06-24 – 2022-07-01 (×9): 375 mg via ORAL
  Filled 2022-06-24 (×12): qty 10

## 2022-06-24 MED ORDER — GUAIFENESIN 100 MG/5ML PO LIQD: 5.0000 mL | ORAL | Status: DC | PRN

## 2022-06-24 MED ORDER — LIDOCAINE HCL (CARDIAC) PF 100 MG/5ML IV SOSY
PREFILLED_SYRINGE | INTRAVENOUS | Status: DC | PRN
  Administered 2022-06-24: 60 mg via INTRAVENOUS

## 2022-06-24 MED ORDER — HYDRALAZINE HCL 20 MG/ML IJ SOLN: 10.0000 mg | INTRAMUSCULAR | Status: DC | PRN

## 2022-06-24 MED ORDER — FENTANYL CITRATE (PF) 100 MCG/2ML IJ SOLN
INTRAMUSCULAR | Status: AC
  Filled 2022-06-24: qty 2

## 2022-06-24 MED ORDER — SUGAMMADEX SODIUM 200 MG/2ML IV SOLN
INTRAVENOUS | Status: DC | PRN
  Administered 2022-06-24: 200 mg via INTRAVENOUS

## 2022-06-24 MED ORDER — PROPOFOL 10 MG/ML IV BOLUS
INTRAVENOUS | Status: DC | PRN
  Administered 2022-06-24: 80 mg via INTRAVENOUS

## 2022-06-24 MED ORDER — ONDANSETRON HCL 4 MG/2ML IJ SOLN
INTRAMUSCULAR | Status: DC | PRN
  Administered 2022-06-24: 4 mg via INTRAVENOUS

## 2022-06-24 MED ORDER — ROCURONIUM BROMIDE 10 MG/ML (PF) SYRINGE
PREFILLED_SYRINGE | INTRAVENOUS | Status: AC
  Filled 2022-06-24: qty 10

## 2022-06-24 MED ORDER — ONDANSETRON HCL 4 MG/2ML IJ SOLN: 4.0000 mg | Freq: Four times a day (QID) | INTRAMUSCULAR | Status: DC | PRN

## 2022-06-24 MED ORDER — SODIUM CHLORIDE 0.9 % IV SOLN
2.0000 g | INTRAVENOUS | Status: AC
  Filled 2022-06-24: qty 2

## 2022-06-24 MED ORDER — DEXTROSE-NACL 5-0.45 % IV SOLN: INTRAVENOUS | Status: AC

## 2022-06-24 MED ORDER — VALPROIC ACID 250 MG/5ML PO SOLN
500.0000 mg | Freq: Every day | ORAL | Status: DC
  Administered 2022-06-24 – 2022-07-04 (×10): 500 mg via ORAL
  Filled 2022-06-24 (×11): qty 10

## 2022-06-24 MED ORDER — IPRATROPIUM-ALBUTEROL 0.5-2.5 (3) MG/3ML IN SOLN: 3.0000 mL | RESPIRATORY_TRACT | Status: DC | PRN

## 2022-06-24 MED ORDER — LIDOCAINE HCL (PF) 2 % IJ SOLN
INTRAMUSCULAR | Status: AC
  Filled 2022-06-24: qty 5

## 2022-06-24 MED ORDER — DICLOFENAC SUPPOSITORY 100 MG
RECTAL | Status: DC | PRN
  Administered 2022-06-24: 100 mg via RECTAL

## 2022-06-24 MED ORDER — METOPROLOL TARTRATE 5 MG/5ML IV SOLN: 5.0000 mg | INTRAVENOUS | Status: DC | PRN

## 2022-06-24 MED ORDER — FENTANYL CITRATE (PF) 100 MCG/2ML IJ SOLN
INTRAMUSCULAR | Status: DC | PRN
  Administered 2022-06-24: 25 ug via INTRAVENOUS

## 2022-06-24 MED ORDER — LACTATED RINGERS IV SOLN: INTRAVENOUS | Status: DC | PRN

## 2022-06-24 MED ORDER — DICLOFENAC SUPPOSITORY 100 MG: 100.0000 mg | Freq: Once | RECTAL | Status: DC

## 2022-06-24 MED ORDER — PROPOFOL 10 MG/ML IV BOLUS
INTRAVENOUS | Status: AC
  Filled 2022-06-24: qty 20

## 2022-06-24 NOTE — Op Note (Signed)
[facility] Gastroenterology Patient Name: Roberta Hampton Procedure Date: 06/24/2022 2:29 PM MRN: [id number] Account #: [id number] Date of Birth: 1965-10-23 Admit Type: Inpatient Age: 56 Room: [facility] ENDO ROOM 4 Gender: Female Note Status: Finalized Instrument Name: Selinda Eon [id number] Procedure:             ERCP Indications:           Tumor of the head of pancreas Providers:             Lucilla Lame MD, MD Referring MD:          Sherman, MD (Referring MD) Medicines:             General Anesthesia Complications:         No immediate complications. Procedure:             Pre-Anesthesia Assessment:                        - Prior to the procedure, a History and Physical was                         performed, and patient medications and allergies were                         reviewed. The patient's tolerance of previous                         anesthesia was also reviewed. The risks and benefits                         of the procedure and the sedation options and risks                         were discussed with the patient. All questions were                         answered, and informed consent was obtained. Prior                         Anticoagulants: The patient has taken no previous                         anticoagulant or antiplatelet agents. ASA Grade                         Assessment: III - A patient with severe systemic                         disease. After reviewing the risks and benefits, the                         patient was deemed in satisfactory condition to                         undergo the procedure.                        After obtaining informed consent, the scope was passed  under direct vision. Throughout the procedure, the                         patient's blood pressure, pulse, and oxygen                         saturations were monitored continuously. The                          Duodenoscope was introduced through the mouth, The                         procedure was aborted. The scope was not inserted.                         bile ducts Medications were duodenum. The ERCP was                         somewhat difficult due to a partially obstructing                         mass. The patient tolerated the procedure well. Findings:      The scout film was normal. The upper GI tract was traversed under direct       vision without detailed examination. A large mass with bleeding was       found in the second portion of the duodenum. A TTS dilator was passed       through the scope. Dilation with a 12-13.5-15 mm pyloric balloon dilator       was performed under fluoroscopic guidance in the second portion of the       duodenum. 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                        - Dilation performed in the second portion of the                         duodenum. Recommendation:        - Return patient to hospital ward for ongoing care.                        - Resume previous diet.                        - PTC through interventional radiology Procedure Code(s):     --- Professional ---                        [phone], Esophagogastroduodenoscopy, flexible,                         transoral; with dilation of gastric/duodenal                         stricture(s) (eg, balloon, bougie)                        [id number], Intraluminal dilation of strictures and/or  obstructions (eg, esophagus), radiological supervision                         and interpretation Diagnosis Code(s):     --- Professional ---                        D49.0, Neoplasm of unspecified behavior of digestive                         system                        K31.89, Other diseases of stomach and duodenum CPT copyright 2019 [employer]. All rights reserved. The codes documented in this report are preliminary and upon coder review may  be  revised to meet current compliance requirements. Lucilla Lame MD, MD 06/24/2022 3:21:03 PM This report has been signed electronically. Number of Addenda: 0 Note Initiated On: 06/24/2022 2:29 PM Estimated Blood Loss:  Estimated blood loss: none.      [facility]

## 2022-06-24 NOTE — Plan of Care (Signed)
AM CBG 60.  Orders placed to change fluids to D5 1/2 NS.  Changing fluids and informed Dr.

## 2022-06-24 NOTE — Anesthesia Postprocedure Evaluation (Signed)
Anesthesia Post Note  Patient: KARLISSA ARON  Procedure(s) Performed: ENDOSCOPIC RETROGRADE CHOLANGIOPANCREATOGRAPHY (ERCP) WITH PROPOFOL  Patient location during evaluation: PACU Anesthesia Type: General Level of consciousness: awake and alert, oriented and patient cooperative Pain management: pain level controlled Vital Signs Assessment: post-procedure vital signs reviewed and stable Respiratory status: spontaneous breathing, nonlabored ventilation and respiratory function stable Cardiovascular status: blood pressure returned to baseline and stable Postop Assessment: adequate PO intake Anesthetic complications: no   No notable events documented.   Last Vitals:  Vitals:   06/24/22 1525 06/24/22 1535  BP:    Pulse:    Resp:  15  Temp: 37 C   SpO2:      Last Pain:  Vitals:   06/24/22 1525  TempSrc: Temporal  PainSc:                  Darrin Nipper

## 2022-06-24 NOTE — Anesthesia Preprocedure Evaluation (Signed)
Anesthesia Evaluation  Patient identified by MRN, date of birth, ID band Patient awake    Reviewed: Allergy & Precautions, NPO status , Patient's Chart, lab work & pertinent test results  Airway Mallampati: Unable to assess  TM Distance: >3 FB Neck ROM: full    Dental  (+) Dental Advidsory Given   Pulmonary neg pulmonary ROS,    Pulmonary exam normal        Cardiovascular (-) Past MI and (-) CABG negative cardio ROS Normal cardiovascular exam     Neuro/Psych PSYCHIATRIC DISORDERS negative neurological ROS     GI/Hepatic negative GI ROS, Neg liver ROS,   Endo/Other  negative endocrine ROS  Renal/GU negative Renal ROS  negative genitourinary   Musculoskeletal   Abdominal   Peds  Hematology negative hematology ROS (+)   Anesthesia Other Findings Past Medical History: No date: Attention deficit disorder No date: Hyponatremia     Comment:  associated with polydipsia No date: Leukopenia No date: Mental retardation No date: Obesity No date: Onychomycosis No date: Seizure disorder ([place]) No date: Seizures ([place]) No date: Thrombocytopenia ([place])  Past Surgical History: No date: ABDOMINAL HYSTERECTOMY 05/08/2015: ORIF ANKLE FRACTURE; Left     Comment:  Procedure: OPEN REDUCTION INTERNAL FIXATION (ORIF) ANKLE              FRACTURE;  Surgeon: Corky Mull, MD;  Location: [facility]               ORS;  Service: Orthopedics;  Laterality: Left; No date: VAGINAL HYSTERECTOMY     Reproductive/Obstetrics negative OB ROS                             Anesthesia Physical Anesthesia Plan  ASA: 3  Anesthesia Plan: General ETT   Post-op Pain Management:    Induction: Intravenous  PONV Risk Score and Plan: 4 or greater and Propofol infusion and TIVA  Airway Management Planned: Oral ETT  Additional Equipment:   Intra-op Plan:   Post-operative Plan: Extubation in OR  Informed Consent: I have  reviewed the patients History and Physical, chart, labs and discussed the procedure including the risks, benefits and alternatives for the proposed anesthesia with the patient or authorized representative who has indicated his/her understanding and acceptance.     Dental Advisory Given  Plan Discussed with: Anesthesiologist, CRNA and Surgeon  Anesthesia Plan Comments: (Patient consented for risks of anesthesia including but not limited to:  - adverse reactions to medications - damage to eyes, teeth, lips or other oral mucosa - nerve damage due to positioning  - sore throat or hoarseness - Damage to heart, brain, nerves, lungs, other parts of body or loss of life  Patient voiced understanding.)        Anesthesia Quick Evaluation

## 2022-06-24 NOTE — ED Notes (Signed)
Pt brought over from [facility]. Pt placed in inpatient bed and changed. Pt had IV started and night time meds given. Pt had IV cobanded and tubing taped in place. Pt is attempting to sit up and go to side of bed. Pt's caregivers left and a sitter was requested. Pt placed in yellow socks and bed alarm activated. Pt appears very drowsy.

## 2022-06-24 NOTE — Anesthesia Procedure Notes (Signed)
Procedure Name: Intubation Date/Time: 06/24/2022 2:38 PM  Performed by: Cammie Sickle, CRNAPre-anesthesia Checklist: Patient identified, Patient being monitored, Timeout performed, Emergency Drugs available and Suction available Patient Re-evaluated:Patient Re-evaluated prior to induction Oxygen Delivery Method: Circle system utilized Preoxygenation: Pre-oxygenation with 100% oxygen Induction Type: IV induction Ventilation: Mask ventilation without difficulty Laryngoscope Size: 3 and McGraph Grade View: Grade I Tube type: Oral Tube size: 7.0 mm Number of attempts: 1 Airway Equipment and Method: Stylet Placement Confirmation: ETT inserted through vocal cords under direct vision, positive ETCO2 and breath sounds checked- equal and bilateral Secured at: 21 cm Tube secured with: Tape Dental Injury: Teeth and Oropharynx as per pre-operative assessment

## 2022-06-24 NOTE — Transfer of Care (Signed)
Immediate Anesthesia Transfer of Care Note  Patient: Roberta Hampton  Procedure(s) Performed: ENDOSCOPIC RETROGRADE CHOLANGIOPANCREATOGRAPHY (ERCP) WITH PROPOFOL  Patient Location: Endoscopy Unit  Anesthesia Type:General  Level of Consciousness: drowsy  Airway & Oxygen Therapy: Patient Spontanous Breathing and Patient connected to face mask oxygen  Post-op Assessment: Report given to RN and Post -op Vital signs reviewed and stable  Post vital signs: Reviewed and stable  Last Vitals:  Vitals Value Taken Time  BP 110/69   Temp    Pulse 72 06/24/22 1525  Resp 15 06/24/22 1525  SpO2 99 % 06/24/22 1525  Vitals shown include unvalidated device data.  Last Pain:  Vitals:   06/24/22 1334  TempSrc: Tympanic  PainSc:          Complications: No notable events documented.

## 2022-06-24 NOTE — Progress Notes (Addendum)
PROGRESS NOTE    Roberta Hampton  LNZ:[id number] DOB: [birth year] DOA: 06/23/2022 PCP: [facility] [place]   Brief Narrative:  56 year old with history of seizure disorder, ADD, MR with developmental delay comes to the ED for acute onset of jaundice and abnormal lab at PCP office.  Patient also had dark tea colored urine during this time.  Recently was diagnosed with UTI on 10/11 treated with Macrobid.  Upon admission had transaminitis, right upper quadrant ultrasound showed distended gallbladder with dilated CBD 9 mm, no evidence of cholelithiasis.  MRCP shows potential pancreatic head mass with multiple liver lesion.  GI team consulted.   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  CT and MRCP showing distended CBD, pancreatic head mass and multiple liver lesion.  GI team consulted as patient will need ERCP.  Patient is currently n.p.o., IV fluids, Accu-Cheks. -Follow LFTs, lipase, total bilirubin  Addendum 340pm: Spoke with GI and patient was found to have obstructing duodenal mass on ERCP and was hard to pass the scope thereafter I spoke with Dr. Laurence Ferrari from IR.  As patient is not currently critically ill, he will plan on performing PTC on Monday, allow decompression of biliary tree thereafter form ultrasound-guided liver lesion biopsy.  We will check PT/OT.  N.p.o. 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  Given  patient's developmental delay and ongoing medical issues, safety is a concern therefore would benefit from a sitter  DVT prophylaxis: Lovenox will start tomorrow.  Hopefully ERCP today Code Status: Full code Family Communication:  Spoke with her mother Kermit Balo.   Status is: Inpatient Ongoing evaluation for pancreatitis, pancreatic and liver lesion.    Subjective: Patient resting this morning. Wakes up, doesn't give much information.  Mother tells me at baseline her mind is of a "56 yr old" and has been living at a group home.    Examination:  General exam: Appears calm and comfortable  Respiratory system: Clear to auscultation. Respiratory effort normal. Cardiovascular system: S1 & S2 heard, RRR. No JVD, murmurs, rubs, gallops or clicks. No pedal edema. Gastrointestinal system: Abdomen is nondistended, soft and nontender. No organomegaly or masses felt. Normal bowel sounds heard. Central nervous system: Alert and oriented x name.  Extremities: No edema.  Skin: Jaundiced.  Psychiatry: Judgement and insight appear Poor    Objective: Vitals:   06/24/22 0400 06/24/22 0430 06/24/22 0530 06/24/22 0600  BP: (!) 92/58 (!) 93/58 100/61 (!) 91/56  Pulse: 69 69 70 73  Resp:  16    Temp:  (!) 97.4 F (36.3 C)    TempSrc:      SpO2: 94% 95% 97% 96%   No intake or output data in the 24 hours ending 06/24/22 0832 There were no vitals filed for this visit.   Data Reviewed:   CBC: Recent Labs  Lab 06/23/22 1839  WBC 4.3  NEUTROABS 1.9  HGB 13.8  HCT 41.6  MCV 96.7  PLT 561*   Basic Metabolic Panel: Recent Labs  Lab 06/23/22 1839  NA  134*  K 4.2  CL 97*  CO2 28  GLUCOSE 70  BUN 18  CREATININE 0.37*  CALCIUM 9.2   GFR: Estimated Creatinine Clearance: 62.4 mL/min (A) (by C-G formula based on SCr of 0.37 mg/dL (L)). Liver Function Tests: Recent Labs  Lab 06/23/22 1839  AST 89*  ALT 94*  ALKPHOS 547*  BILITOT 10.0*  PROT 6.4*  ALBUMIN 3.2*   Recent Labs  Lab  06/23/22 1839  LIPASE 393*   No results for input(s): "AMMONIA" in the last 168 hours. Coagulation Profile: No results for input(s): "INR", "PROTIME" in the last 168 hours. Cardiac Enzymes: No results for input(s): "CKTOTAL", "CKMB", "CKMBINDEX", "TROPONINI" in the last 168 hours. BNP (last 3 results) No results for input(s): "PROBNP" in the last 8760 hours. HbA1C: No results for input(s): "HGBA1C" in the last 72 hours. CBG: No results for input(s): "GLUCAP" in the last 168 hours. Lipid Profile: No results for input(s): "CHOL", "HDL", "LDLCALC", "TRIG", "CHOLHDL", "LDLDIRECT" in the last 72 hours. Thyroid Function Tests: No results for input(s): "TSH", "T4TOTAL", "FREET4", "T3FREE", "THYROIDAB" in the last 72 hours. Anemia Panel: No results for input(s): "VITAMINB12", "FOLATE", "FERRITIN", "TIBC", "IRON", "RETICCTPCT" in the last 72 hours. Sepsis Labs: No results for input(s): "PROCALCITON", "LATICACIDVEN" in the last 168 hours.  No results found for this or any previous visit (from the past 240 hour(s)).       Radiology Studies: MR ABDOMEN MRCP WO CONTRAST  Result Date: 06/24/2022 CLINICAL DATA:  56 year old female with history of jaundice. EXAM: MRI ABDOMEN WITHOUT CONTRAST  (INCLUDING MRCP) TECHNIQUE: Multiplanar multisequence MR imaging of the abdomen was performed. Heavily T2-weighted images of the biliary and pancreatic ducts were obtained, and three-dimensional MRCP images were rendered by post processing. COMPARISON:  No prior abdominal MRI. Abdominal ultrasound 06/23/2022. PET-CT 02/23/2022. CT of the chest, abdomen and pelvis 01/06/2022. FINDINGS: Comment: Today's study is severely limited for detection and characterization of visceral and/or vascular lesions by lack of IV gadolinium. Lower chest: Small bilateral pleural effusions lie dependently. Hepatobiliary: Multiple poorly defined hepatic lesions are noted which are low T1 signal and mildly high T2 signal intensity,  incompletely evaluated on today's noncontrast examination, but new compared to prior PET-CT and CT examinations. The largest of these lesions measure up to 2.1 x 1.0 cm in the periphery of segment 5 (axial image 21 of series 4) and 1.9 x 1.3 cm in the central aspect of segment 7 (axial image 12 of series 4). There is a suggestion of some peripheral diffusion restriction for several of these lesions. Severe intra and extrahepatic biliary ductal dilatation noted, new compared to the prior examination, measuring up to 12 mm in the porta hepatis. Abrupt cut off of the distal common bile duct immediately before the level of the ampulla. No filling defect in the common bile duct to suggest choledocholithiasis. Gallbladder is severely distended. Gallbladder wall does not appear thickened. No pericholecystic fluid or surrounding inflammatory changes. Pancreas: In the head of the pancreas (axial image 55 of series 10) there is an area of potential diffusion restriction. This is poorly demonstrated on today's noncontrast images, and difficult to discretely measure, but concerning for potential pancreatic head mass. Associated with this there is dilatation of the main pancreatic duct which measures up to 6 mm in the body of the pancreas, along with diffuse side branch ectasia. No peripancreatic fluid collections or inflammatory changes. Spleen: Multiple T1 and T2 hypointense spleen lesions, incompletely characterized on today's noncontrast examination, but measuring up to  2.3 cm (axial image 7 of series 4). Adrenals/Urinary Tract: No suspicious renal lesions are noted on today's noncontrast examination. 8 mm T1 hypointense, T2 hyperintense lesion in the upper pole of the left kidney posteriorly, statistically likely a small cysts (no imaging follow-up recommended). Bilateral extrarenal pelvises. 2 filling defects are noted in the right renal pelvis on axial image 20 of series 4 and 22 of series 4, potentially calculi.  Bilateral adrenal glands are normal in appearance. Stomach/Bowel: Visualized portions are unremarkable. Vascular/Lymphatic: No aneurysm identified in the visualized abdominal vasculature on today's noncontrast examination. Other: No significant volume of ascites noted in the visualized portions of the peritoneal cavity. Musculoskeletal: No aggressive appearing osseous lesions are noted in the visualized portions of the skeleton. IMPRESSION: 1. Severely limited examination secondary to lack of IV gadolinium. With these limitations in mind, there is severe intra and extrahepatic biliary ductal dilatation, along with diffuse pancreatic ductal dilatation, and there is a suggestion of a mass in the head of the pancreas based on diffusion weighted images concerning for malignancy. Further evaluation with repeat abdominal MRI with and without IV gadolinium with MRCP is recommended to better evaluate this finding. Alternatively, endoscopic ultrasound and tissue sampling could be considered if clinically appropriate. 2. Multiple indeterminate liver lesions, poorly characterized on today's noncontrast examination, but new compared to prior CT and PET-CT examinations. The possibility of metastatic disease in the liver should be considered. Other aggressive etiology such as multifocal hepatic abscesses could also be considered in the appropriate clinical setting, however, the degree of diffusion restriction noted with these lesions is less than would be expected for abscesses. 3. Multiple indeterminate splenic lesions stable compared to prior CT examinations and not associated with diffusion restriction, likely benign lesions. 4. Small bilateral pleural effusions lying dependently. Electronically Signed   By: Vinnie Langton M.D.   On: 06/24/2022 06:19   MR 3D Recon At Scanner  Result Date: 06/24/2022 CLINICAL DATA:  56 year old female with history of jaundice. EXAM: MRI ABDOMEN WITHOUT CONTRAST  (INCLUDING MRCP)  TECHNIQUE: Multiplanar multisequence MR imaging of the abdomen was performed. Heavily T2-weighted images of the biliary and pancreatic ducts were obtained, and three-dimensional MRCP images were rendered by post processing. COMPARISON:  No prior abdominal MRI. Abdominal ultrasound 06/23/2022. PET-CT 02/23/2022. CT of the chest, abdomen and pelvis 01/06/2022. FINDINGS: Comment: Today's study is severely limited for detection and characterization of visceral and/or vascular lesions by lack of IV gadolinium. Lower chest: Small bilateral pleural effusions lie dependently. Hepatobiliary: Multiple poorly defined hepatic lesions are noted which are low T1 signal and mildly high T2 signal intensity, incompletely evaluated on today's noncontrast examination, but new compared to prior PET-CT and CT examinations. The largest of these lesions measure up to 2.1 x 1.0 cm in the periphery of segment 5 (axial image 21 of series 4) and 1.9 x 1.3 cm in the central aspect of segment 7 (axial image 12 of series 4). There is a suggestion of some peripheral diffusion restriction for several of these lesions. Severe intra and extrahepatic biliary ductal dilatation noted, new compared to the prior examination, measuring up to 12 mm in the porta hepatis. Abrupt cut off of the distal common bile duct immediately before the level of the ampulla. No filling defect in the common bile duct to suggest choledocholithiasis. Gallbladder is severely distended. Gallbladder wall does not appear thickened. No pericholecystic fluid or surrounding inflammatory changes. Pancreas: In the head of the pancreas (axial image 55 of series 10) there is an  area of potential diffusion restriction. This is poorly demonstrated on today's noncontrast images, and difficult to discretely measure, but concerning for potential pancreatic head mass. Associated with this there is dilatation of the main pancreatic duct which measures up to 6 mm in the body of the pancreas,  along with diffuse side branch ectasia. No peripancreatic fluid collections or inflammatory changes. Spleen: Multiple T1 and T2 hypointense spleen lesions, incompletely characterized on today's noncontrast examination, but measuring up to 2.3 cm (axial image 7 of series 4). Adrenals/Urinary Tract: No suspicious renal lesions are noted on today's noncontrast examination. 8 mm T1 hypointense, T2 hyperintense lesion in the upper pole of the left kidney posteriorly, statistically likely a small cysts (no imaging follow-up recommended). Bilateral extrarenal pelvises. 2 filling defects are noted in the right renal pelvis on axial image 20 of series 4 and 22 of series 4, potentially calculi. Bilateral adrenal glands are normal in appearance. Stomach/Bowel: Visualized portions are unremarkable. Vascular/Lymphatic: No aneurysm identified in the visualized abdominal vasculature on today's noncontrast examination. Other: No significant volume of ascites noted in the visualized portions of the peritoneal cavity. Musculoskeletal: No aggressive appearing osseous lesions are noted in the visualized portions of the skeleton. IMPRESSION: 1. Severely limited examination secondary to lack of IV gadolinium. With these limitations in mind, there is severe intra and extrahepatic biliary ductal dilatation, along with diffuse pancreatic ductal dilatation, and there is a suggestion of a mass in the head of the pancreas based on diffusion weighted images concerning for malignancy. Further evaluation with repeat abdominal MRI with and without IV gadolinium with MRCP is recommended to better evaluate this finding. Alternatively, endoscopic ultrasound and tissue sampling could be considered if clinically appropriate. 2. Multiple indeterminate liver lesions, poorly characterized on today's noncontrast examination, but new compared to prior CT and PET-CT examinations. The possibility of metastatic disease in the liver should be considered. Other  aggressive etiology such as multifocal hepatic abscesses could also be considered in the appropriate clinical setting, however, the degree of diffusion restriction noted with these lesions is less than would be expected for abscesses. 3. Multiple indeterminate splenic lesions stable compared to prior CT examinations and not associated with diffusion restriction, likely benign lesions. 4. Small bilateral pleural effusions lying dependently. Electronically Signed   By: Daniel  Entrikin M.D.   On: 06/24/2022 06:19   US ABDOMEN LIMITED RUQ (LIVER/GB)  Result Date: 06/23/2022 CLINICAL DATA:  Elevated LFTs EXAM: ULTRASOUND ABDOMEN LIMITED RIGHT UPPER QUADRANT COMPARISON:  CT 01/06/2022 FINDINGS: Gallbladder: Distended gallbladder. No gallstones or wall thickening visualized. No sonographic Murphy sign noted by sonographer. Common bile duct: Diameter: 9 mm Liver: No focal lesion identified. Within normal limits in parenchymal echogenicity. Portal vein is patent on color Doppler imaging with normal direction of blood flow towards the liver. Other: None. IMPRESSION: Distended gallbladder with dilation of the common bile duct measuring 9 mm. This is grossly similar to PET/CT 02/23/2022 given differences in technique. Consider MRI/MRCP for further evaluation if there is concern for obstructing stone or mass. No evidence of cholecystitis. Electronically Signed   By: Tyler  Stutzman M.D.   On: 06/23/2022 21:25        Scheduled Meds:  calcium-vitamin D   Oral BID WC   cyanocobalamin  2,000 mcg Oral Daily   docusate  50 mg Oral BID   enoxaparin (LOVENOX) injection  40 mg Subcutaneous Q24H   feeding supplement   Oral BID BM   folic acid  1 mg Oral Daily   mirabegron   ER  25 mg Oral Daily   multivitamin with minerals   Oral Daily   polyethylene glycol  17 g Oral Daily   QUEtiapine  800 mg Oral QHS   risperidone  2 mg Oral BID   valproic acid  500 mg Oral Daily   And   valproic acid  375 mg Oral QHS   vitamin  E  200 Units Oral Daily   Continuous Infusions:  sodium chloride 100 mL/hr at 06/24/22 0133     LOS: 1 day   Time spent= 35 mins    Peggy Monk Arsenio Loader, MD [facility]  If 7PM-7AM, please contact night-coverage  06/24/2022, 8:32 AM

## 2022-06-25 ENCOUNTER — Encounter: Payer: Self-pay | Admitting: Gastroenterology

## 2022-06-25 DIAGNOSIS — K851 Biliary acute pancreatitis without necrosis or infection: Secondary | ICD-10-CM | POA: Diagnosis not present

## 2022-06-25 DIAGNOSIS — R6250 Unspecified lack of expected normal physiological development in childhood: Secondary | ICD-10-CM | POA: Diagnosis not present

## 2022-06-25 DIAGNOSIS — K8050 Calculus of bile duct without cholangitis or cholecystitis without obstruction: Secondary | ICD-10-CM | POA: Diagnosis not present

## 2022-06-25 LAB — COMPREHENSIVE METABOLIC PANEL
ALT: 59 U/L — ABNORMAL HIGH (ref 0–44)
AST: 60 U/L — ABNORMAL HIGH (ref 15–41)
Albumin: 2.2 g/dL — ABNORMAL LOW (ref 3.5–5.0)
Alkaline Phosphatase: 413 U/L — ABNORMAL HIGH (ref 38–126)
Anion gap: 3 — ABNORMAL LOW (ref 5–15)
BUN: 9 mg/dL (ref 6–20)
CO2: 27 mmol/L (ref 22–32)
Calcium: 8.3 mg/dL — ABNORMAL LOW (ref 8.9–10.3)
Chloride: 109 mmol/L (ref 98–111)
Creatinine, Ser: 0.32 mg/dL — ABNORMAL LOW (ref 0.44–1.00)
GFR, Estimated: >60 mL/min (ref >60)
Glucose, Bld: 122 mg/dL — ABNORMAL HIGH (ref 70–99)
Potassium: 3.5 mmol/L (ref 3.5–5.1)
Sodium: 139 mmol/L (ref 135–145)
Total Bilirubin: 7.8 mg/dL — ABNORMAL HIGH (ref 0.3–1.2)
Total Protein: 4.4 g/dL — ABNORMAL LOW (ref 6.5–8.1)

## 2022-06-25 LAB — PROTIME-INR
INR: 1.6 — ABNORMAL HIGH (ref 0.8–1.2)
Prothrombin Time: 19.1 seconds — ABNORMAL HIGH (ref 11.4–15.2)

## 2022-06-25 LAB — LIPASE, BLOOD: Lipase: 94 U/L — ABNORMAL HIGH (ref 11–51)

## 2022-06-25 LAB — CBC
HCT: 33.7 % — ABNORMAL LOW (ref 36.0–46.0)
Hemoglobin: 11 g/dL — ABNORMAL LOW (ref 12.0–15.0)
MCH: 31.6 pg (ref 26.0–34.0)
MCHC: 32.6 g/dL (ref 30.0–36.0)
MCV: 96.8 fL (ref 80.0–100.0)
Platelets: 104 10*3/uL — ABNORMAL LOW (ref 150–400)
RBC: 3.48 MIL/uL — ABNORMAL LOW (ref 3.87–5.11)
RDW: 14.7 % (ref 11.5–15.5)
WBC: 2.5 10*3/uL — ABNORMAL LOW (ref 4.0–10.5)
nRBC: 0 % (ref 0.0–0.2)

## 2022-06-25 LAB — GLUCOSE, CAPILLARY
Glucose-Capillary: 108 mg/dL — ABNORMAL HIGH (ref 70–99)
Glucose-Capillary: 79 mg/dL (ref 70–99)
Glucose-Capillary: 114 mg/dL — ABNORMAL HIGH (ref 70–99)

## 2022-06-25 LAB — MAGNESIUM: Magnesium: 1.9 mg/dL (ref 1.7–2.4)

## 2022-06-25 NOTE — Progress Notes (Signed)
[facility]                                                                              Roberta Hampton, is a 56 y.o. female, DOB - 05/24/1966, TML:[id number] Admit date - 06/23/2022    Outpatient Primary MD for the patient is [facility] [place]  LOS - 2  days  Chief Complaint  Patient presents with   Abnormal Labs       Brief summary   56 year old with history of seizure disorder, ADD, MR with developmental delay comes to the ED for acute onset of jaundice and abnormal lab at PCP office.  Patient also had dark tea colored urine during this time.  Recently was diagnosed with UTI on 10/11 treated with Macrobid.  Upon admission had transaminitis, right upper quadrant ultrasound showed distended gallbladder with dilated CBD 9 mm, no evidence of cholelithiasis.  MRCP shows potential pancreatic head mass with multiple liver lesion.  GI consulted.  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[place]) - Stable, continue Depakote   Code Status: Full code DVT Prophylaxis:  enoxaparin (LOVENOX) injection 40 mg Start: 06/24/22 0800   Level of Care: Level of care: Telemetry Medical Family Communication: No family at the  bedside  Disposition Plan:      Remains inpatient appropriate: Plan for [facility] and liver biopsy on Monday 10/23   Procedures:  ER CP  Consultants:   GI IR  Antimicrobials:   Anti-infectives (From admission, onward)    Start     Dose/Rate Route Frequency Ordered Stop   06/27/22 0800  cefOXitin (MEFOXIN) 2 g in sodium chloride 0.9 % 100 mL IVPB       Note to Pharmacy: To radiology for procedure   2 g 200 mL/hr over 30 Minutes Intravenous On call 06/24/22 1717 06/28/22 0800          Medications  calcium-vitamin D   Oral BID WC   cyanocobalamin  2,000 mcg Oral Daily   diclofenac  100 mg Rectal Once   docusate  50 mg Oral BID   enoxaparin (LOVENOX) injection  40 mg Subcutaneous Q24H   feeding supplement   Oral BID BM   folic acid  1 mg Oral Daily   mirabegron ER  25 mg Oral Daily   multivitamin with minerals   Oral Daily   polyethylene glycol  17 g Oral Daily   QUEtiapine  800 mg Oral QHS   risperidone  2 mg Oral BID  valproic acid  500 mg Oral Daily   And   valproic acid  375 mg Oral QHS   vitamin E  200 Units Oral Daily      Subjective:   Roberta Hampton was seen and examined today.  Sitting up in the chair, eating breakfast, no acute complaints.  Appears to be comfortable  Objective:   Vitals:   06/24/22 1625 06/24/22 1800 06/24/22 2123 06/24/22 2156  BP: 110/80 112/82 97/69 (!) 147/77  Pulse: 80 78 71 69  Resp: _0 Temp: 98.7 F (37.1 C) 98.5 F (36.9 C) 98.4 F (36.9 C) (!) 97.5 F (36.4 C)  TempSrc:    Oral  SpO2: 98% 98% 98% 100%    Intake/Output Summary (Last 24 hours) at 06/25/2022 1154 Last data filed at 06/25/2022 0645 Gross per 24 hour  Intake 700 ml  Output 1300 ml  Net -600 ml     Wt Readings from Last 3 Encounters:  06/24/22 57.6 kg  02/28/22 57.7 kg  02/04/22 58.1 kg     Exam General: Alert and awake, appears comfortable Cardiovascular: S1 S2 auscultated,  RRR Respiratory: CTAB Gastrointestinal: Soft, nontender,  nondistended, + bowel sounds Ext: no pedal edema bilaterally Neuro: moving all 4 extremities spontaneously Psych: developmental delay with poor insight    Data Reviewed:  I have personally reviewed following labs    CBC Lab Results  Component Value Date   WBC 2.5 (L) 06/25/2022   RBC 3.48 (L) 06/25/2022   HGB 11.0 (L) 06/25/2022   HCT 33.7 (L) 06/25/2022   MCV 96.8 06/25/2022   MCH 31.6 06/25/2022   PLT 104 (L) 06/25/2022   MCHC 32.6 06/25/2022   RDW 14.7 06/25/2022   LYMPHSABS 1.5 06/23/2022   MONOABS 0.7 06/23/2022   EOSABS 0.1 06/23/2022   BASOSABS 0.1 81/27/5170     Last metabolic panel Lab Results  Component Value Date   NA 139 06/25/2022   K 3.5 06/25/2022   CL 109 06/25/2022   CO2 27 06/25/2022   BUN 9 06/25/2022   CREATININE 0.32 (L) 06/25/2022   GLUCOSE 122 (H) 06/25/2022   GFRNONAA >60 06/25/2022   GFRAA 114 05/08/2019   CALCIUM 8.3 (L) 06/25/2022   PROT 4.4 (L) 06/25/2022   ALBUMIN 2.2 (L) 06/25/2022   LABGLOB 2.2 02/04/2022   AGRATIO 2.1 05/08/2019   BILITOT 7.8 (H) 06/25/2022   ALKPHOS 413 (H) 06/25/2022   AST 60 (H) 06/25/2022   ALT 59 (H) 06/25/2022   ANIONGAP 3 (L) 06/25/2022    CBG (last 3)  Recent Labs    06/24/22 0918 06/25/22 0642  GLUCAP 60* 108*      Coagulation Profile: Recent Labs  Lab 06/25/22 0452  INR 1.6*     Radiology Studies: I have personally reviewed the imaging studies  DG C-Arm 1-60 Min-No Report  Result Date: 06/24/2022 Fluoroscopy was utilized by the requesting physician.  No radiographic interpretation.   MR ABDOMEN MRCP WO CONTRAST  Result Date: 06/24/2022 CLINICAL DATA:  56 year old female with history of jaundice. EXAM: MRI ABDOMEN WITHOUT CONTRAST  (INCLUDING MRCP) TECHNIQUE: Multiplanar multisequence MR imaging of the abdomen was performed. Heavily T2-weighted images of the biliary and pancreatic ducts were obtained, and three-dimensional MRCP images were rendered by post processing. COMPARISON:   No prior abdominal MRI. Abdominal ultrasound 06/23/2022. PET-CT 02/23/2022. CT of the chest, abdomen and pelvis 01/06/2022. FINDINGS: Comment: Today's study is severely limited for detection and characterization of visceral and/or vascular lesions by lack of  IV gadolinium. Lower chest: Small bilateral pleural effusions lie dependently. Hepatobiliary: Multiple poorly defined hepatic lesions are noted which are low T1 signal and mildly high T2 signal intensity, incompletely evaluated on today's noncontrast examination, but new compared to prior PET-CT and CT examinations. The largest of these lesions measure up to 2.1 x 1.0 cm in the periphery of segment 5 (axial image 21 of series 4) and 1.9 x 1.3 cm in the central aspect of segment 7 (axial image 12 of series 4). There is a suggestion of some peripheral diffusion restriction for several of these lesions. Severe intra and extrahepatic biliary ductal dilatation noted, new compared to the prior examination, measuring up to 12 mm in the porta hepatis. Abrupt cut off of the distal common bile duct immediately before the level of the ampulla. No filling defect in the common bile duct to suggest choledocholithiasis. Gallbladder is severely distended. Gallbladder wall does not appear thickened. No pericholecystic fluid or surrounding inflammatory changes. Pancreas: In the head of the pancreas (axial image 55 of series 10) there is an area of potential diffusion restriction. This is poorly demonstrated on today's noncontrast images, and difficult to discretely measure, but concerning for potential pancreatic head mass. Associated with this there is dilatation of the main pancreatic duct which measures up to 6 mm in the body of the pancreas, along with diffuse side branch ectasia. No peripancreatic fluid collections or inflammatory changes. Spleen: Multiple T1 and T2 hypointense spleen lesions, incompletely characterized on today's noncontrast examination, but measuring up to  2.3 cm (axial image 7 of series 4). Adrenals/Urinary Tract: No suspicious renal lesions are noted on today's noncontrast examination. 8 mm T1 hypointense, T2 hyperintense lesion in the upper pole of the left kidney posteriorly, statistically likely a small cysts (no imaging follow-up recommended). Bilateral extrarenal pelvises. 2 filling defects are noted in the right renal pelvis on axial image 20 of series 4 and 22 of series 4, potentially calculi. Bilateral adrenal glands are normal in appearance. Stomach/Bowel: Visualized portions are unremarkable. Vascular/Lymphatic: No aneurysm identified in the visualized abdominal vasculature on today's noncontrast examination. Other: No significant volume of ascites noted in the visualized portions of the peritoneal cavity. Musculoskeletal: No aggressive appearing osseous lesions are noted in the visualized portions of the skeleton. IMPRESSION: 1. Severely limited examination secondary to lack of IV gadolinium. With these limitations in mind, there is severe intra and extrahepatic biliary ductal dilatation, along with diffuse pancreatic ductal dilatation, and there is a suggestion of a mass in the head of the pancreas based on diffusion weighted images concerning for malignancy. Further evaluation with repeat abdominal MRI with and without IV gadolinium with MRCP is recommended to better evaluate this finding. Alternatively, endoscopic ultrasound and tissue sampling could be considered if clinically appropriate. 2. Multiple indeterminate liver lesions, poorly characterized on today's noncontrast examination, but new compared to prior CT and PET-CT examinations. The possibility of metastatic disease in the liver should be considered. Other aggressive etiology such as multifocal hepatic abscesses could also be considered in the appropriate clinical setting, however, the degree of diffusion restriction noted with these lesions is less than would be expected for abscesses. 3.  Multiple indeterminate splenic lesions stable compared to prior CT examinations and not associated with diffusion restriction, likely benign lesions. 4. Small bilateral pleural effusions lying dependently. Electronically Signed   By: Vinnie Langton M.D.   On: 06/24/2022 06:19   MR 3D Recon At Scanner  Result Date: 06/24/2022 CLINICAL DATA:  56 year old female with  history of jaundice. EXAM: MRI ABDOMEN WITHOUT CONTRAST  (INCLUDING MRCP) TECHNIQUE: Multiplanar multisequence MR imaging of the abdomen was performed. Heavily T2-weighted images of the biliary and pancreatic ducts were obtained, and three-dimensional MRCP images were rendered by post processing. COMPARISON:  No prior abdominal MRI. Abdominal ultrasound 06/23/2022. PET-CT 02/23/2022. CT of the chest, abdomen and pelvis 01/06/2022. FINDINGS: Comment: Today's study is severely limited for detection and characterization of visceral and/or vascular lesions by lack of IV gadolinium. Lower chest: Small bilateral pleural effusions lie dependently. Hepatobiliary: Multiple poorly defined hepatic lesions are noted which are low T1 signal and mildly high T2 signal intensity, incompletely evaluated on today's noncontrast examination, but new compared to prior PET-CT and CT examinations. The largest of these lesions measure up to 2.1 x 1.0 cm in the periphery of segment 5 (axial image 21 of series 4) and 1.9 x 1.3 cm in the central aspect of segment 7 (axial image 12 of series 4). There is a suggestion of some peripheral diffusion restriction for several of these lesions. Severe intra and extrahepatic biliary ductal dilatation noted, new compared to the prior examination, measuring up to 12 mm in the porta hepatis. Abrupt cut off of the distal common bile duct immediately before the level of the ampulla. No filling defect in the common bile duct to suggest choledocholithiasis. Gallbladder is severely distended. Gallbladder wall does not appear thickened. No  pericholecystic fluid or surrounding inflammatory changes. Pancreas: In the head of the pancreas (axial image 55 of series 10) there is an area of potential diffusion restriction. This is poorly demonstrated on today's noncontrast images, and difficult to discretely measure, but concerning for potential pancreatic head mass. Associated with this there is dilatation of the main pancreatic duct which measures up to 6 mm in the body of the pancreas, along with diffuse side branch ectasia. No peripancreatic fluid collections or inflammatory changes. Spleen: Multiple T1 and T2 hypointense spleen lesions, incompletely characterized on today's noncontrast examination, but measuring up to 2.3 cm (axial image 7 of series 4). Adrenals/Urinary Tract: No suspicious renal lesions are noted on today's noncontrast examination. 8 mm T1 hypointense, T2 hyperintense lesion in the upper pole of the left kidney posteriorly, statistically likely a small cysts (no imaging follow-up recommended). Bilateral extrarenal pelvises. 2 filling defects are noted in the right renal pelvis on axial image 20 of series 4 and 22 of series 4, potentially calculi. Bilateral adrenal glands are normal in appearance. Stomach/Bowel: Visualized portions are unremarkable. Vascular/Lymphatic: No aneurysm identified in the visualized abdominal vasculature on today's noncontrast examination. Other: No significant volume of ascites noted in the visualized portions of the peritoneal cavity. Musculoskeletal: No aggressive appearing osseous lesions are noted in the visualized portions of the skeleton. IMPRESSION: 1. Severely limited examination secondary to lack of IV gadolinium. With these limitations in mind, there is severe intra and extrahepatic biliary ductal dilatation, along with diffuse pancreatic ductal dilatation, and there is a suggestion of a mass in the head of the pancreas based on diffusion weighted images concerning for malignancy. Further evaluation  with repeat abdominal MRI with and without IV gadolinium with MRCP is recommended to better evaluate this finding. Alternatively, endoscopic ultrasound and tissue sampling could be considered if clinically appropriate. 2. Multiple indeterminate liver lesions, poorly characterized on today's noncontrast examination, but new compared to prior CT and PET-CT examinations. The possibility of metastatic disease in the liver should be considered. Other aggressive etiology such as multifocal hepatic abscesses could also be considered in the  appropriate clinical setting, however, the degree of diffusion restriction noted with these lesions is less than would be expected for abscesses. 3. Multiple indeterminate splenic lesions stable compared to prior CT examinations and not associated with diffusion restriction, likely benign lesions. 4. Small bilateral pleural effusions lying dependently. Electronically Signed   By: Vinnie Langton M.D.   On: 06/24/2022 06:19   US ABDOMEN LIMITED RUQ (LIVER/GB)  Result Date: 06/23/2022 CLINICAL DATA:  Elevated LFTs EXAM: ULTRASOUND ABDOMEN LIMITED RIGHT UPPER QUADRANT COMPARISON:  CT 01/06/2022 FINDINGS: Gallbladder: Distended gallbladder. No gallstones or wall thickening visualized. No sonographic Murphy sign noted by sonographer. Common bile duct: Diameter: 9 mm Liver: No focal lesion identified. Within normal limits in parenchymal echogenicity. Portal vein is patent on color Doppler imaging with normal direction of blood flow towards the liver. Other: None. IMPRESSION: Distended gallbladder with dilation of the common bile duct measuring 9 mm. This is grossly similar to PET/CT 02/23/2022 given differences in technique. Consider MRI/MRCP for further evaluation if there is concern for obstructing stone or mass. No evidence of cholecystitis. Electronically Signed   By: Placido Sou M.D.   On: 06/23/2022 21:25       Roberta Hampton M.D. [facility] 06/25/2022, 11:54  AM  Available via Epic secure chat 7am-7pm After 7 pm, please refer to night coverage provider listed on amion.

## 2022-06-26 DIAGNOSIS — K8050 Calculus of bile duct without cholangitis or cholecystitis without obstruction: Secondary | ICD-10-CM | POA: Diagnosis not present

## 2022-06-26 DIAGNOSIS — R6250 Unspecified lack of expected normal physiological development in childhood: Secondary | ICD-10-CM | POA: Diagnosis not present

## 2022-06-26 DIAGNOSIS — K851 Biliary acute pancreatitis without necrosis or infection: Secondary | ICD-10-CM | POA: Diagnosis not present

## 2022-06-26 LAB — COMPREHENSIVE METABOLIC PANEL
ALT: 69 U/L — ABNORMAL HIGH (ref 0–44)
AST: 74 U/L — ABNORMAL HIGH (ref 15–41)
Albumin: 2.4 g/dL — ABNORMAL LOW (ref 3.5–5.0)
Alkaline Phosphatase: 448 U/L — ABNORMAL HIGH (ref 38–126)
Anion gap: 6 (ref 5–15)
BUN: 8 mg/dL (ref 6–20)
CO2: 26 mmol/L (ref 22–32)
Calcium: 8.3 mg/dL — ABNORMAL LOW (ref 8.9–10.3)
Chloride: 107 mmol/L (ref 98–111)
Creatinine, Ser: 0.45 mg/dL (ref 0.44–1.00)
GFR, Estimated: >60 mL/min (ref >60)
Glucose, Bld: 100 mg/dL — ABNORMAL HIGH (ref 70–99)
Potassium: 3.5 mmol/L (ref 3.5–5.1)
Sodium: 139 mmol/L (ref 135–145)
Total Bilirubin: 9.2 mg/dL — ABNORMAL HIGH (ref 0.3–1.2)
Total Protein: 4.7 g/dL — ABNORMAL LOW (ref 6.5–8.1)

## 2022-06-26 LAB — CBC
HCT: 34.8 % — ABNORMAL LOW (ref 36.0–46.0)
Hemoglobin: 11.8 g/dL — ABNORMAL LOW (ref 12.0–15.0)
MCH: 32.2 pg (ref 26.0–34.0)
MCHC: 33.9 g/dL (ref 30.0–36.0)
MCV: 94.8 fL (ref 80.0–100.0)
Platelets: 122 10*3/uL — ABNORMAL LOW (ref 150–400)
RBC: 3.67 MIL/uL — ABNORMAL LOW (ref 3.87–5.11)
RDW: 14.7 % (ref 11.5–15.5)
WBC: 4 10*3/uL (ref 4.0–10.5)
nRBC: 0 % (ref 0.0–0.2)

## 2022-06-26 LAB — MAGNESIUM: Magnesium: 1.9 mg/dL (ref 1.7–2.4)

## 2022-06-26 LAB — GLUCOSE, CAPILLARY
Glucose-Capillary: 108 mg/dL — ABNORMAL HIGH (ref 70–99)
Glucose-Capillary: 121 mg/dL — ABNORMAL HIGH (ref 70–99)
Glucose-Capillary: 142 mg/dL — ABNORMAL HIGH (ref 70–99)
Glucose-Capillary: 71 mg/dL (ref 70–99)

## 2022-06-26 NOTE — Plan of Care (Signed)
  Problem: Education: Goal: Knowledge of General Education information will improve Description: Including pain rating scale, medication(s)/side effects and non-pharmacologic comfort measures Outcome: Progressing   Problem: Clinical Measurements: Goal: Ability to maintain clinical measurements within normal limits will improve Outcome: Progressing   Problem: Activity: Goal: Risk for activity intolerance will decrease Outcome: Progressing   Problem: Elimination: Goal: Will not experience complications related to bowel motility Outcome: Progressing   Problem: Pain Managment: Goal: General experience of comfort will improve Outcome: Progressing   Problem: Safety: Goal: Ability to remain free from injury will improve Outcome: Progressing   Problem: Skin Integrity: Goal: Risk for impaired skin integrity will decrease Outcome: Progressing   

## 2022-06-26 NOTE — Progress Notes (Signed)
[facility]                                                                              Roberta Hampton, is a 56 y.o. female, DOB - 1966-01-24, [id number] Admit date - 06/23/2022    Outpatient Primary MD for the patient is [facility]  LOS - 3  days  Chief Complaint  Patient presents with   Abnormal Labs       Brief summary   56 year old with history of seizure disorder, ADD, MR with developmental delay comes to the ED for acute onset of jaundice and abnormal lab at PCP office.  Patient also had dark tea colored urine during this time.  Recently was diagnosed with UTI on 10/11 treated with Macrobid.  Upon admission had transaminitis, right upper quadrant ultrasound showed distended gallbladder with dilated CBD 9 mm, no evidence of cholelithiasis.  MRCP shows potential pancreatic head mass with multiple liver lesion.  GI consulted.  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[place]) - Stable, continue Depakote   Code Status: Full code DVT Prophylaxis:  enoxaparin (LOVENOX) injection 40 mg Start: 06/24/22 0800   Level of Care: Level of care: Telemetry Medical Family  Communication: No family at the bedside  Disposition Plan:      Remains inpatient appropriate: Plan for [facility] and liver biopsy on Monday 10/23   Procedures:  ER CP  Consultants:   GI IR  Antimicrobials:   Anti-infectives (From admission, onward)    Start     Dose/Rate Route Frequency Ordered Stop   06/27/22 0800  cefOXitin (MEFOXIN) 2 g in sodium chloride 0.9 % 100 mL IVPB       Note to Pharmacy: To radiology for procedure   2 g 200 mL/hr over 30 Minutes Intravenous On call 06/24/22 1717 06/28/22 0800          Medications  calcium-vitamin D   Oral BID WC   cyanocobalamin  2,000 mcg Oral Daily   diclofenac  100 mg Rectal Once   docusate  50 mg Oral BID   enoxaparin (LOVENOX) injection  40 mg Subcutaneous Q24H   feeding supplement   Oral BID BM   folic acid  1 mg Oral Daily   mirabegron ER  25 mg Oral Daily   multivitamin with minerals   Oral Daily   polyethylene glycol  17 g Oral Daily   QUEtiapine  800 mg Oral QHS   risperidone  2  mg Oral BID   valproic acid  500 mg Oral Daily   And   valproic acid  375 mg Oral QHS   vitamin E  200 Units Oral Daily      Subjective:   Roberta Hampton was seen and examined today.  In the chair, appears comfortable, no acute complaints or issues overnight.  No fevers, no abdominal pain Objective:   Vitals:   06/24/22 2123 06/24/22 2156 06/25/22 2311 06/26/22 0744  BP: 97/69 (!) 147/77 123/64 96/71  Pulse: 71 69 76 86  Resp: $Remo'18 20 17   'atxyu$ Temp: 98.4 F (36.9 C) (!) 97.5 F (36.4 C) 97.7 F (36.5 C) 97.6 F (36.4 C)  TempSrc:  Oral    SpO2: 98% 100% 98% 97%    Intake/Output Summary (Last 24 hours) at 06/26/2022 1228 Last data filed at 06/25/2022 1640 Gross per 24 hour  Intake --  Output 500 ml  Net -500 ml     Wt Readings from Last 3 Encounters:  06/24/22 57.6 kg  02/28/22 57.7 kg  02/04/22 58.1 kg   Physical Exam General: Alert and, appears comfortable, NAD Cardiovascular: S1 S2 clear, RRR.  Respiratory:  CTAB, no wheezing, rales or rhonchi Gastrointestinal: Soft, nontender, nondistended, NBS Ext: no pedal edema bilaterally Neuro: no new deficits, moving all 4 extremities spontaneously Psych: close to her baseline  Data Reviewed:  I have personally reviewed following labs    CBC Lab Results  Component Value Date   WBC 4.0 06/26/2022   RBC 3.67 (L) 06/26/2022   HGB 11.8 (L) 06/26/2022   HCT 34.8 (L) 06/26/2022   MCV 94.8 06/26/2022   MCH 32.2 06/26/2022   PLT 122 (L) 06/26/2022   MCHC 33.9 06/26/2022   RDW 14.7 06/26/2022   LYMPHSABS 1.5 06/23/2022   MONOABS 0.7 06/23/2022   EOSABS 0.1 06/23/2022   BASOSABS 0.1 21/19/4174     Last metabolic panel Lab Results  Component Value Date   NA 139 06/26/2022   K 3.5 06/26/2022   CL 107 06/26/2022   CO2 26 06/26/2022   BUN 8 06/26/2022   CREATININE 0.45 06/26/2022   GLUCOSE 100 (H) 06/26/2022   GFRNONAA >60 06/26/2022   GFRAA 114 05/08/2019   CALCIUM 8.3 (L) 06/26/2022   PROT 4.7 (L) 06/26/2022   ALBUMIN 2.4 (L) 06/26/2022   LABGLOB 2.2 02/04/2022   AGRATIO 2.1 05/08/2019   BILITOT 9.2 (H) 06/26/2022   ALKPHOS 448 (H) 06/26/2022   AST 74 (H) 06/26/2022   ALT 69 (H) 06/26/2022   ANIONGAP 6 06/26/2022    CBG (last 3)  Recent Labs    06/26/22 0010 06/26/22 0616 06/26/22 1150  GLUCAP 108* 71 142*      Coagulation Profile: Recent Labs  Lab 06/25/22 0452  INR 1.6*     Radiology Studies: I have personally reviewed the imaging studies  DG C-Arm 1-60 Min-No Report  Result Date: 06/24/2022 Fluoroscopy was utilized by the requesting physician.  No radiographic interpretation.       Estill Cotta M.D. [facility] 06/26/2022, 12:28 PM  Available via Epic secure chat 7am-7pm After 7 pm, please refer to night coverage provider listed on amion.

## 2022-06-26 NOTE — Plan of Care (Signed)
  Problem: Health Behavior/Discharge Planning: Goal: Ability to manage health-related needs will improve Outcome: Not Applicable   Problem: Clinical Measurements: Goal: Cardiovascular complication will be avoided Outcome: Progressing   Problem: Activity: Goal: Risk for activity intolerance will decrease Outcome: Progressing   Problem: Coping: Goal: Level of anxiety will decrease Outcome: Progressing   Problem: Elimination: Goal: Will not experience complications related to urinary retention Outcome: Progressing

## 2022-06-27 DIAGNOSIS — K851 Biliary acute pancreatitis without necrosis or infection: Secondary | ICD-10-CM | POA: Diagnosis not present

## 2022-06-27 DIAGNOSIS — R6250 Unspecified lack of expected normal physiological development in childhood: Secondary | ICD-10-CM | POA: Diagnosis not present

## 2022-06-27 DIAGNOSIS — K8050 Calculus of bile duct without cholangitis or cholecystitis without obstruction: Secondary | ICD-10-CM | POA: Diagnosis not present

## 2022-06-27 LAB — GLUCOSE, CAPILLARY
Glucose-Capillary: 94 mg/dL (ref 70–99)
Glucose-Capillary: 82 mg/dL (ref 70–99)
Glucose-Capillary: 72 mg/dL (ref 70–99)
Glucose-Capillary: 129 mg/dL — ABNORMAL HIGH (ref 70–99)

## 2022-06-27 LAB — MAGNESIUM: Magnesium: 2.1 mg/dL (ref 1.7–2.4)

## 2022-06-27 LAB — CBC WITH DIFFERENTIAL/PLATELET
Abs Immature Granulocytes: 0.12 10*3/uL — ABNORMAL HIGH (ref 0.00–0.07)
Basophils Absolute: 0 10*3/uL (ref 0.0–0.1)
Basophils Relative: 1 %
Eosinophils Absolute: 0 10*3/uL (ref 0.0–0.5)
Eosinophils Relative: 1 %
HCT: 33.3 % — ABNORMAL LOW (ref 36.0–46.0)
Hemoglobin: 11.3 g/dL — ABNORMAL LOW (ref 12.0–15.0)
Immature Granulocytes: 2 %
Lymphocytes Relative: 37 %
Lymphs Abs: 2.3 10*3/uL (ref 0.7–4.0)
MCH: 31.9 pg (ref 26.0–34.0)
MCHC: 33.9 g/dL (ref 30.0–36.0)
MCV: 94.1 fL (ref 80.0–100.0)
Monocytes Absolute: 1.2 10*3/uL — ABNORMAL HIGH (ref 0.1–1.0)
Monocytes Relative: 20 %
Neutro Abs: 2.5 10*3/uL (ref 1.7–7.7)
Neutrophils Relative %: 39 %
Platelets: 164 10*3/uL (ref 150–400)
RBC: 3.54 MIL/uL — ABNORMAL LOW (ref 3.87–5.11)
RDW: 15.1 % (ref 11.5–15.5)
WBC: 6.2 10*3/uL (ref 4.0–10.5)
nRBC: 0 % (ref 0.0–0.2)

## 2022-06-27 LAB — COMPREHENSIVE METABOLIC PANEL
ALT: 82 U/L — ABNORMAL HIGH (ref 0–44)
AST: 100 U/L — ABNORMAL HIGH (ref 15–41)
Albumin: 2.2 g/dL — ABNORMAL LOW (ref 3.5–5.0)
Alkaline Phosphatase: 466 U/L — ABNORMAL HIGH (ref 38–126)
Anion gap: 6 (ref 5–15)
BUN: 8 mg/dL (ref 6–20)
CO2: 28 mmol/L (ref 22–32)
Calcium: 8.7 mg/dL — ABNORMAL LOW (ref 8.9–10.3)
Chloride: 106 mmol/L (ref 98–111)
Creatinine, Ser: 0.46 mg/dL (ref 0.44–1.00)
GFR, Estimated: >60 mL/min (ref >60)
Glucose, Bld: 84 mg/dL (ref 70–99)
Potassium: 3.8 mmol/L (ref 3.5–5.1)
Sodium: 140 mmol/L (ref 135–145)
Total Bilirubin: 10.7 mg/dL — ABNORMAL HIGH (ref 0.3–1.2)
Total Protein: 4.7 g/dL — ABNORMAL LOW (ref 6.5–8.1)

## 2022-06-27 LAB — PROTIME-INR
INR: 2.2 — ABNORMAL HIGH (ref 0.8–1.2)
Prothrombin Time: 24.4 seconds — ABNORMAL HIGH (ref 11.4–15.2)

## 2022-06-27 MED ORDER — ORAL CARE MOUTH RINSE: 15.0000 mL | OROMUCOSAL | Status: DC | PRN

## 2022-06-27 NOTE — Care Management Important Message (Signed)
Important Message  Patient Details  Name: Roberta Hampton MRN: [id number] Date of Birth: 1966/05/17   Medicare Important Message Given:  Yes     Dannette Barbara 06/27/2022, 12:57 PM

## 2022-06-27 NOTE — Progress Notes (Addendum)
[facility]                                                                              Roberta Hampton, is a 56 y.o. female, DOB - 03/29/1966, QQP:[id number] Admit date - 06/23/2022    Outpatient Primary MD for the patient is [facility]  LOS - 4  days  Chief Complaint  Patient presents with   Abnormal Labs       Brief summary   56 year old with history of seizure disorder, ADD, MR with developmental delay comes to the ED for acute onset of jaundice and abnormal lab at PCP office.  Patient also had dark tea colored urine during this time.  Recently was diagnosed with UTI on 10/11 treated with Macrobid.  Upon admission had transaminitis, right upper quadrant ultrasound showed distended gallbladder with dilated CBD 9 mm, no evidence of cholelithiasis.  MRCP shows potential pancreatic head mass with multiple liver lesion.  GI consulted.  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 -CT and MRCP showed distended CBD, pancreatic head mass and multiple liver lesions -GI consulted, underwent ERCP on 10/20, showed malignant duodenal mass, dilation performed in the second portion of duodenum -IR was consulted, plan for PTC and liver biopsy today -N.p.o.    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   Procedures:  ER CP  Consultants:   GI IR  Antimicrobials:   Anti-infectives (From admission, onward)    Start     Dose/Rate Route Frequency Ordered Stop   06/27/22 0800  cefOXitin (MEFOXIN) 2 g in sodium chloride 0.9 % 100 mL IVPB       Note to Pharmacy: To radiology for procedure   2 g 200 mL/hr over 30 Minutes Intravenous On call 06/24/22 1717 06/28/22 0800          Medications  calcium-vitamin D   Oral BID WC   cyanocobalamin  2,000 mcg Oral Daily   diclofenac  100 mg Rectal Once   docusate  50 mg Oral BID   enoxaparin (LOVENOX) injection  40 mg Subcutaneous Q24H   feeding supplement   Oral BID BM   folic acid  1 mg Oral Daily   mirabegron ER  25 mg Oral Daily   multivitamin with minerals   Oral Daily   polyethylene glycol  17 g Oral Daily   QUEtiapine  800 mg Oral QHS   risperidone  2 mg Oral BID   valproic acid  500 mg Oral  Daily   And   valproic acid  375 mg Oral QHS   vitamin E  200 Units Oral Daily      Subjective:   Roberta Hampton was seen and examined today.  Resting, no acute complaints, appears to be comfortable.  No acute issues overnight.  No fevers or chills, abdominal pain nausea vomiting.    Objective:   Vitals:   06/25/22 2311 06/26/22 0744 06/26/22 1605 06/27/22 0819  BP: 123/64 96/71 (!) 103/54 100/62  Pulse: 76 86 88 84  Resp: $Remo'17  17 18  'OiBEc$ Temp: 97.7 F (36.5 C) 97.6 F (36.4 C) 98 F (36.7 C) 97.7 F (36.5 C)  TempSrc:    Oral  SpO2: 98% 97% 99% 97%   No intake or output data in the 24 hours ending 06/27/22 1140    Wt Readings from Last 3 Encounters:  06/24/22 57.6 kg  02/28/22 57.7 kg  02/04/22 58.1 kg   Physical Exam General: Resting comfortably, NAD Cardiovascular: S1 S2 clear, RRR.  Respiratory: CTAB, no wheezing, rales or rhonchi Gastrointestinal: Soft, nontender, nondistended, NBS Ext: no pedal edema bilaterally  Data Reviewed:  I have personally reviewed  following labs    CBC Lab Results  Component Value Date   WBC 6.2 06/27/2022   RBC 3.54 (L) 06/27/2022   HGB 11.3 (L) 06/27/2022   HCT 33.3 (L) 06/27/2022   MCV 94.1 06/27/2022   MCH 31.9 06/27/2022   PLT 164 06/27/2022   MCHC 33.9 06/27/2022   RDW 15.1 06/27/2022   LYMPHSABS 2.3 06/27/2022   MONOABS 1.2 (H) 06/27/2022   EOSABS 0.0 06/27/2022   BASOSABS 0.0 50/56/9794     Last metabolic panel Lab Results  Component Value Date   NA 140 06/27/2022   K 3.8 06/27/2022   CL 106 06/27/2022   CO2 28 06/27/2022   BUN 8 06/27/2022   CREATININE 0.46 06/27/2022   GLUCOSE 84 06/27/2022   GFRNONAA >60 06/27/2022   GFRAA 114 05/08/2019   CALCIUM 8.7 (L) 06/27/2022   PROT 4.7 (L) 06/27/2022   ALBUMIN 2.2 (L) 06/27/2022   LABGLOB 2.2 02/04/2022   AGRATIO 2.1 05/08/2019   BILITOT 10.7 (H) 06/27/2022   ALKPHOS 466 (H) 06/27/2022   AST 100 (H) 06/27/2022   ALT 82 (H) 06/27/2022   ANIONGAP 6 06/27/2022    CBG (last 3)  Recent Labs    06/26/22 1150 06/26/22 1841 06/27/22 0610  GLUCAP 142* 121* 82      Coagulation Profile: Recent Labs  Lab 06/25/22 0452 06/27/22 0309  INR 1.6* 2.2*     Radiology Studies: I have personally reviewed the imaging studies  No results found.     Estill Cotta M.D. [facility] 06/27/2022, 11:40 AM  Available via Epic secure chat 7am-7pm After 7 pm, please refer to night coverage provider listed on amion.

## 2022-06-27 NOTE — TOC Progression Note (Signed)
Transition of Care [facility]) - Progression Note    Patient Details  Name: ANABEL LYKINS MRN: [id number] Date of Birth: [birth year]  Transition of Care [facility]) CM/SW [place], RN Phone Number: 06/27/2022, 10:21 AM  Clinical Narrative:    TOC to monitor and follow for needs  Plan for [facility] and liver biopsy on Monday 10/23         Expected Discharge Plan and Services                                                 Social Determinants of Health (SDOH) Interventions    Readmission Risk Interventions     No data to display

## 2022-06-27 NOTE — Consult Note (Signed)
Chief Complaint: Patient was seen in consultation today for  Chief Complaint  Patient presents with   Abnormal Labs    Referring Physician(s): Dr. Andres Labrum  Supervising Physician: Pernell Dupre  Patient Status: [facility] - In-pt  History of Present Illness: Roberta Hampton is a 56 y.o. female with a medical history significant for attention deficit disorder, intellectual disability with developmental delay, obesity and seizure disorder. She was sent to the ED 06/23/22 by her PCP for abnormal liver labs and jaundice. Work up in the ED revealed elevated liver enzymes and a total bilirubin level of 10. RUQ ultrasound showed a distended gallbladder with dilatation of the common bile duct - no evidence of cholecystitis. She underwent MRCP which showed a potential pancreatic head mass with multiple liver lesions.   MRCP 06/24/22 IMPRESSION: 1. Severely limited examination secondary to lack of IV gadolinium. With these limitations in mind, there is severe intra and extrahepatic biliary ductal dilatation, along with diffuse pancreatic ductal dilatation, and there is a suggestion of a mass in the head of the pancreas based on diffusion weighted images concerning for malignancy. Further evaluation with repeat abdominal MRI with and without IV gadolinium with MRCP is recommended to better evaluate this finding. Alternatively, endoscopic ultrasound and tissue sampling could be considered if clinically appropriate. 2. Multiple indeterminate liver lesions, poorly characterized on today's noncontrast examination, but new compared to prior CT and PET-CT examinations. The possibility of metastatic disease in the liver should be considered. Other aggressive etiology such as multifocal hepatic abscesses could also be considered in the appropriate clinical setting, however, the degree of diffusion restriction noted with these lesions is less than would be expected for abscesses. 3. Multiple  indeterminate splenic lesions stable compared to prior CT examinations and not associated with diffusion restriction, likely benign lesions. 4. Small bilateral pleural effusions lying dependently.  She also underwent ERCP 06/24/22 and "a large mass with bleeding was found in the second portion of the duodenum." The second portion of the duodenum was dilated during the procedure. 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   The GI team recommended percutaneous hepatic cholangiogram with possible biliary drain placement. Imaging reviewed and procedure approved by Dr. Juliette Alcide.   Past Medical History:  Diagnosis Date   Attention deficit disorder    Hyponatremia    associated with polydipsia   Leukopenia    Mental retardation    Obesity    Onychomycosis    Seizure disorder (HCC)    Seizures (HCC)    Thrombocytopenia (HCC)     Past Surgical History:  Procedure Laterality Date   ABDOMINAL HYSTERECTOMY     ENDOSCOPIC RETROGRADE CHOLANGIOPANCREATOGRAPHY (ERCP) WITH PROPOFOL N/A 06/24/2022   Procedure: ENDOSCOPIC RETROGRADE CHOLANGIOPANCREATOGRAPHY (ERCP) WITH PROPOFOL;  Surgeon: Midge Minium, MD;  Location: [facility] ENDOSCOPY;  Service: Endoscopy;  Laterality: N/A;   ORIF ANKLE FRACTURE Left 05/08/2015   Procedure: OPEN REDUCTION INTERNAL FIXATION (ORIF) ANKLE FRACTURE;  Surgeon: Christena Flake, MD;  Location: [facility] ORS;  Service: Orthopedics;  Laterality: Left;   VAGINAL HYSTERECTOMY      Allergies: Patient has no known allergies.  Medications: Prior to Admission medications   Medication Sig Start Date End Date Taking? Authorizing Provider  Calcium Carb-Cholecalciferol (CALCIUM 600 + D PO) Take 600 mg by mouth 2 (two) times daily.   Yes [provider]  CHILDRENS LORATADINE 5 MG/5ML syrup Take 10 mg by mouth daily. 05/12/20  Yes [provider]  cyanocobalamin 2000 MCG tablet Take 2,500 mcg by mouth daily.  Yes [provider]  Docusate Sodium 150 MG/15ML syrup Take  50 mg by mouth 2 (two) times daily.   Yes [provider]  fluvoxaMINE (LUVOX) 25 MG tablet Take 25 mg by mouth at bedtime. 01/04/19  Yes [provider]  folic acid (FOLVITE) 1 MG tablet Take 1 mg by mouth daily. 05/11/20  Yes [provider]  Multiple Vitamin (THEREMS PO) Take 1 tablet by mouth daily.   Yes [provider]  MYRBETRIQ 25 MG TB24 tablet TAKE 1 TABLET BY MOUTH ONCE DAILY *DO NOT CRUSH OR CHEW* 05/31/21  Yes McGowan, Larene Beach A, PA-C  nitrofurantoin, macrocrystal-monohydrate, (MACROBID) 100 MG capsule Take 100 mg by mouth 2 (two) times daily. 06/15/22  Yes [provider]  Nutritional Supplements (ENSURE) POWD Take by mouth 2 (two) times daily. 05/11/20  Yes [provider]  polyethylene glycol (MIRALAX / GLYCOLAX) packet Take 17 g by mouth daily.   Yes [provider]  QUEtiapine (SEROQUEL) 400 MG tablet Take 800 mg by mouth at bedtime.    Yes [provider]  RISPERDAL 1 MG tablet Take 2 mg by mouth 2 (two) times daily. 05/05/22  Yes [provider]  valproic acid (DEPAKENE) 250 MG/5ML solution TAKE 10MLS ('500MG'$ ) BY MOUTH ONCE DAILY IN THE MORNING;TAKE 7.5MLS ('375MG'$ ) BY MOUTH EVERY NIGHT AT BEDTIME 05/05/22  Yes Millikan, Jinny Blossom, NP  vitamin E 200 UNIT capsule Take 200 Units by mouth daily.   Yes [provider]  guaiFENesin (ROBITUSSIN) 100 MG/5ML SOLN Take 5 mLs by mouth every 4 (four) hours as needed for cough or to loosen phlegm.    [provider]     Family History  Problem Relation Age of Onset   Asthma Brother    Seizures Other        paternal cousin   Breast cancer Neg Hx     Social History   Socioeconomic History   Marital status: Single    Spouse name: Not on file   Number of children: 0   Years of education: Not on file   Highest education level: Not on file  Occupational History   Not on file  Tobacco Use   Smoking status: Never   Smokeless tobacco: Never  Vaping  Use   Vaping Use: Never used  Substance and Sexual Activity   Alcohol use: No   Drug use: No   Sexual activity: Not on file  Other Topics Concern   Not on file  Social History Narrative   Patient is single and lives in a group home; in [place] [> 25 years] Patient does not have any children; Patient is right handed;Patient drinks caffeine daily. No smoking or alcohol. Mom lives in [place].    Social Determinants of Health   Financial Resource Strain: Not on file  Food Insecurity: Not on file  Transportation Needs: Not on file  Physical Activity: Not on file  Stress: Not on file  Social Connections: Not on file    Review of Systems: A 12 point ROS discussed and pertinent positives are indicated in the HPI above.  All other systems are negative.  Review of Systems  Unable to perform ROS: Other    Vital Signs: BP 100/62 (BP Location: Right Arm)   Pulse 84   Temp 97.7 F (36.5 C) (Oral)   Resp 18   SpO2 97%   Physical Exam Constitutional:      General: She is not in acute distress. HENT:  Mouth/Throat:     Mouth: Mucous membranes are moist.     Pharynx: Oropharynx is clear.  Cardiovascular:     Rate and Rhythm: Normal rate and regular rhythm.     Pulses: Normal pulses.     Heart sounds: Normal heart sounds.  Pulmonary:     Effort: Pulmonary effort is normal.     Breath sounds: Normal breath sounds.  Abdominal:     Palpations: Abdomen is soft.     Tenderness: There is abdominal tenderness.  Musculoskeletal:     Right lower leg: No edema.     Left lower leg: No edema.  Skin:    General: Skin is warm and dry.     Coloration: Skin is jaundiced.  Neurological:     Mental Status: She is alert. She is disoriented.     Imaging: DG C-Arm 1-60 Min-No Report  Result Date: 06/24/2022 Fluoroscopy was utilized by the requesting physician.  No radiographic interpretation.   MR ABDOMEN MRCP WO CONTRAST  Result Date: 06/24/2022 CLINICAL DATA:  56 year old  female with history of jaundice. EXAM: MRI ABDOMEN WITHOUT CONTRAST  (INCLUDING MRCP) TECHNIQUE: Multiplanar multisequence MR imaging of the abdomen was performed. Heavily T2-weighted images of the biliary and pancreatic ducts were obtained, and three-dimensional MRCP images were rendered by post processing. COMPARISON:  No prior abdominal MRI. Abdominal ultrasound 06/23/2022. PET-CT 02/23/2022. CT of the chest, abdomen and pelvis 01/06/2022. FINDINGS: Comment: Today's study is severely limited for detection and characterization of visceral and/or vascular lesions by lack of IV gadolinium. Lower chest: Small bilateral pleural effusions lie dependently. Hepatobiliary: Multiple poorly defined hepatic lesions are noted which are low T1 signal and mildly high T2 signal intensity, incompletely evaluated on today's noncontrast examination, but new compared to prior PET-CT and CT examinations. The largest of these lesions measure up to 2.1 x 1.0 cm in the periphery of segment 5 (axial image 21 of series 4) and 1.9 x 1.3 cm in the central aspect of segment 7 (axial image 12 of series 4). There is a suggestion of some peripheral diffusion restriction for several of these lesions. Severe intra and extrahepatic biliary ductal dilatation noted, new compared to the prior examination, measuring up to 12 mm in the porta hepatis. Abrupt cut off of the distal common bile duct immediately before the level of the ampulla. No filling defect in the common bile duct to suggest choledocholithiasis. Gallbladder is severely distended. Gallbladder wall does not appear thickened. No pericholecystic fluid or surrounding inflammatory changes. Pancreas: In the head of the pancreas (axial image 55 of series 10) there is an area of potential diffusion restriction. This is poorly demonstrated on today's noncontrast images, and difficult to discretely measure, but concerning for potential pancreatic head mass. Associated with this there is  dilatation of the main pancreatic duct which measures up to 6 mm in the body of the pancreas, along with diffuse side branch ectasia. No peripancreatic fluid collections or inflammatory changes. Spleen: Multiple T1 and T2 hypointense spleen lesions, incompletely characterized on today's noncontrast examination, but measuring up to 2.3 cm (axial image 7 of series 4). Adrenals/Urinary Tract: No suspicious renal lesions are noted on today's noncontrast examination. 8 mm T1 hypointense, T2 hyperintense lesion in the upper pole of the left kidney posteriorly, statistically likely a small cysts (no imaging follow-up recommended). Bilateral extrarenal pelvises. 2 filling defects are noted in the right renal pelvis on axial image 20 of series 4 and 22 of series 4, potentially calculi. Bilateral adrenal glands  are normal in appearance. Stomach/Bowel: Visualized portions are unremarkable. Vascular/Lymphatic: No aneurysm identified in the visualized abdominal vasculature on today's noncontrast examination. Other: No significant volume of ascites noted in the visualized portions of the peritoneal cavity. Musculoskeletal: No aggressive appearing osseous lesions are noted in the visualized portions of the skeleton. IMPRESSION: 1. Severely limited examination secondary to lack of IV gadolinium. With these limitations in mind, there is severe intra and extrahepatic biliary ductal dilatation, along with diffuse pancreatic ductal dilatation, and there is a suggestion of a mass in the head of the pancreas based on diffusion weighted images concerning for malignancy. Further evaluation with repeat abdominal MRI with and without IV gadolinium with MRCP is recommended to better evaluate this finding. Alternatively, endoscopic ultrasound and tissue sampling could be considered if clinically appropriate. 2. Multiple indeterminate liver lesions, poorly characterized on today's noncontrast examination, but new compared to prior CT and PET-CT  examinations. The possibility of metastatic disease in the liver should be considered. Other aggressive etiology such as multifocal hepatic abscesses could also be considered in the appropriate clinical setting, however, the degree of diffusion restriction noted with these lesions is less than would be expected for abscesses. 3. Multiple indeterminate splenic lesions stable compared to prior CT examinations and not associated with diffusion restriction, likely benign lesions. 4. Small bilateral pleural effusions lying dependently. Electronically Signed   By: Vinnie Langton M.D.   On: 06/24/2022 06:19   MR 3D Recon At Scanner  Result Date: 06/24/2022 CLINICAL DATA:  56 year old female with history of jaundice. EXAM: MRI ABDOMEN WITHOUT CONTRAST  (INCLUDING MRCP) TECHNIQUE: Multiplanar multisequence MR imaging of the abdomen was performed. Heavily T2-weighted images of the biliary and pancreatic ducts were obtained, and three-dimensional MRCP images were rendered by post processing. COMPARISON:  No prior abdominal MRI. Abdominal ultrasound 06/23/2022. PET-CT 02/23/2022. CT of the chest, abdomen and pelvis 01/06/2022. FINDINGS: Comment: Today's study is severely limited for detection and characterization of visceral and/or vascular lesions by lack of IV gadolinium. Lower chest: Small bilateral pleural effusions lie dependently. Hepatobiliary: Multiple poorly defined hepatic lesions are noted which are low T1 signal and mildly high T2 signal intensity, incompletely evaluated on today's noncontrast examination, but new compared to prior PET-CT and CT examinations. The largest of these lesions measure up to 2.1 x 1.0 cm in the periphery of segment 5 (axial image 21 of series 4) and 1.9 x 1.3 cm in the central aspect of segment 7 (axial image 12 of series 4). There is a suggestion of some peripheral diffusion restriction for several of these lesions. Severe intra and extrahepatic biliary ductal dilatation noted, new  compared to the prior examination, measuring up to 12 mm in the porta hepatis. Abrupt cut off of the distal common bile duct immediately before the level of the ampulla. No filling defect in the common bile duct to suggest choledocholithiasis. Gallbladder is severely distended. Gallbladder wall does not appear thickened. No pericholecystic fluid or surrounding inflammatory changes. Pancreas: In the head of the pancreas (axial image 55 of series 10) there is an area of potential diffusion restriction. This is poorly demonstrated on today's noncontrast images, and difficult to discretely measure, but concerning for potential pancreatic head mass. Associated with this there is dilatation of the main pancreatic duct which measures up to 6 mm in the body of the pancreas, along with diffuse side branch ectasia. No peripancreatic fluid collections or inflammatory changes. Spleen: Multiple T1 and T2 hypointense spleen lesions, incompletely characterized on today's noncontrast examination,  but measuring up to 2.3 cm (axial image 7 of series 4). Adrenals/Urinary Tract: No suspicious renal lesions are noted on today's noncontrast examination. 8 mm T1 hypointense, T2 hyperintense lesion in the upper pole of the left kidney posteriorly, statistically likely a small cysts (no imaging follow-up recommended). Bilateral extrarenal pelvises. 2 filling defects are noted in the right renal pelvis on axial image 20 of series 4 and 22 of series 4, potentially calculi. Bilateral adrenal glands are normal in appearance. Stomach/Bowel: Visualized portions are unremarkable. Vascular/Lymphatic: No aneurysm identified in the visualized abdominal vasculature on today's noncontrast examination. Other: No significant volume of ascites noted in the visualized portions of the peritoneal cavity. Musculoskeletal: No aggressive appearing osseous lesions are noted in the visualized portions of the skeleton. IMPRESSION: 1. Severely limited examination  secondary to lack of IV gadolinium. With these limitations in mind, there is severe intra and extrahepatic biliary ductal dilatation, along with diffuse pancreatic ductal dilatation, and there is a suggestion of a mass in the head of the pancreas based on diffusion weighted images concerning for malignancy. Further evaluation with repeat abdominal MRI with and without IV gadolinium with MRCP is recommended to better evaluate this finding. Alternatively, endoscopic ultrasound and tissue sampling could be considered if clinically appropriate. 2. Multiple indeterminate liver lesions, poorly characterized on today's noncontrast examination, but new compared to prior CT and PET-CT examinations. The possibility of metastatic disease in the liver should be considered. Other aggressive etiology such as multifocal hepatic abscesses could also be considered in the appropriate clinical setting, however, the degree of diffusion restriction noted with these lesions is less than would be expected for abscesses. 3. Multiple indeterminate splenic lesions stable compared to prior CT examinations and not associated with diffusion restriction, likely benign lesions. 4. Small bilateral pleural effusions lying dependently. Electronically Signed   By: Vinnie Langton M.D.   On: 06/24/2022 06:19   US ABDOMEN LIMITED RUQ (LIVER/GB)  Result Date: 06/23/2022 CLINICAL DATA:  Elevated LFTs EXAM: ULTRASOUND ABDOMEN LIMITED RIGHT UPPER QUADRANT COMPARISON:  CT 01/06/2022 FINDINGS: Gallbladder: Distended gallbladder. No gallstones or wall thickening visualized. No sonographic Murphy sign noted by sonographer. Common bile duct: Diameter: 9 mm Liver: No focal lesion identified. Within normal limits in parenchymal echogenicity. Portal vein is patent on color Doppler imaging with normal direction of blood flow towards the liver. Other: None. IMPRESSION: Distended gallbladder with dilation of the common bile duct measuring 9 mm. This is grossly  similar to PET/CT 02/23/2022 given differences in technique. Consider MRI/MRCP for further evaluation if there is concern for obstructing stone or mass. No evidence of cholecystitis. Electronically Signed   By: Placido Sou M.D.   On: 06/23/2022 21:25    Labs:  CBC: Recent Labs    06/24/22 2259 06/25/22 0452 06/26/22 0516 06/27/22 0309  WBC 3.4* 2.5* 4.0 6.2  HGB 13.9 11.0* 11.8* 11.3*  HCT 44.0 33.7* 34.8* 33.3*  PLT 107* 104* 122* 164    COAGS: Recent Labs    06/25/22 0452 06/27/22 0309  INR 1.6* 2.2*    BMP: Recent Labs    06/24/22 2259 06/25/22 0452 06/26/22 0516 06/27/22 0309  NA 137 139 139 140  K 3.9 3.5 3.5 3.8  CL 104 109 107 106  CO2 $Re'26 27 26 28  'qkQ$ GLUCOSE 91 122* 100* 84  BUN $Re'11 9 8 8  'RxB$ CALCIUM 8.8* 8.3* 8.3* 8.7*  CREATININE <0.30* 0.32* 0.45 0.46  GFRNONAA NOT CALCULATED >60 >60 >60    LIVER FUNCTION TESTS: Recent  Labs    06/24/22 2259 06/25/22 0452 06/26/22 0516 06/27/22 0309  BILITOT 10.0* 7.8* 9.2* 10.7*  AST 75* 60* 74* 100*  ALT 76* 59* 69* 82*  ALKPHOS 531* 413* 448* 466*  PROT 5.8* 4.4* 4.7* 4.7*  ALBUMIN 2.9* 2.2* 2.4* 2.2*    TUMOR MARKERS: No results for input(s): "AFPTM", "CEA", "CA199", "CHROMGRNA" in the last 8760 hours.  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 Nack, 56 year old female, is scheduled today for an image-guided percutaneous transhepatic cholangiogram with possible drain placement. A liver lesion biopsy has also been requested - this will be deferred until total bilirubin level has normalized after drain placement. This is due to an increased risk of both bleeding and infection with a dilated biliary system. All of this information was discussed with the patient's mother and sister-in-law at the bedside. Consent for PTC was signed by the patient's mother.  Risks and benefits were discussed with the patient including, but not limited to, bleeding, infection,  gallbladder perforation, bile leak, sepsis or even death.  All of the patient's questions were answered, patient is agreeable to proceed.  Consent signed and in the IR APP office.  Thank you for this interesting consult.  I greatly enjoyed meeting SARIE STALL and look forward to participating in their care.  A copy of this report was sent to the requesting provider on this date.  Electronically Signed: Soyla Dryer, AGACNP-BC [phone] 06/27/2022, 10:28 AM   I spent a total of 20 Minutes    in face to face in clinical consultation, greater than 50% of which was counseling/coordinating care for PTC.

## 2022-06-27 NOTE — Progress Notes (Cosign Needed)
Logged incorrect blood pressure, deleted and logged Correct BP 100/62

## 2022-06-27 NOTE — Progress Notes (Cosign Needed)
Logged incorrect bp, deleted and re-entered correct BP of 100/62

## 2022-06-28 ENCOUNTER — Inpatient Hospital Stay: Payer: Medicare Other | Admitting: Radiology

## 2022-06-28 DIAGNOSIS — R6250 Unspecified lack of expected normal physiological development in childhood: Secondary | ICD-10-CM | POA: Diagnosis not present

## 2022-06-28 DIAGNOSIS — K8050 Calculus of bile duct without cholangitis or cholecystitis without obstruction: Secondary | ICD-10-CM | POA: Diagnosis not present

## 2022-06-28 DIAGNOSIS — K851 Biliary acute pancreatitis without necrosis or infection: Secondary | ICD-10-CM | POA: Diagnosis not present

## 2022-06-28 HISTORY — PX: IR INT EXT BILIARY DRAIN WITH CHOLANGIOGRAM: IMG6044

## 2022-06-28 LAB — COMPREHENSIVE METABOLIC PANEL
ALT: 93 U/L — ABNORMAL HIGH (ref 0–44)
AST: 108 U/L — ABNORMAL HIGH (ref 15–41)
Albumin: 2.2 g/dL — ABNORMAL LOW (ref 3.5–5.0)
Alkaline Phosphatase: 502 U/L — ABNORMAL HIGH (ref 38–126)
Anion gap: 4 — ABNORMAL LOW (ref 5–15)
BUN: 11 mg/dL (ref 6–20)
CO2: 28 mmol/L (ref 22–32)
Calcium: 8.9 mg/dL (ref 8.9–10.3)
Chloride: 107 mmol/L (ref 98–111)
Creatinine, Ser: 0.32 mg/dL — ABNORMAL LOW (ref 0.44–1.00)
GFR, Estimated: >60 mL/min (ref >60)
Glucose, Bld: 80 mg/dL (ref 70–99)
Potassium: 3.8 mmol/L (ref 3.5–5.1)
Sodium: 139 mmol/L (ref 135–145)
Total Bilirubin: 12.3 mg/dL — ABNORMAL HIGH (ref 0.3–1.2)
Total Protein: 4.9 g/dL — ABNORMAL LOW (ref 6.5–8.1)

## 2022-06-28 LAB — SURGICAL PATHOLOGY

## 2022-06-28 LAB — CBC
HCT: 33.8 % — ABNORMAL LOW (ref 36.0–46.0)
Hemoglobin: 11.3 g/dL — ABNORMAL LOW (ref 12.0–15.0)
MCH: 32.2 pg (ref 26.0–34.0)
MCHC: 33.4 g/dL (ref 30.0–36.0)
MCV: 96.3 fL (ref 80.0–100.0)
Platelets: 159 10*3/uL (ref 150–400)
RBC: 3.51 MIL/uL — ABNORMAL LOW (ref 3.87–5.11)
RDW: 15.2 % (ref 11.5–15.5)
WBC: 5.2 10*3/uL (ref 4.0–10.5)
nRBC: 0 % (ref 0.0–0.2)

## 2022-06-28 LAB — GLUCOSE, CAPILLARY
Glucose-Capillary: 91 mg/dL (ref 70–99)
Glucose-Capillary: 84 mg/dL (ref 70–99)

## 2022-06-28 LAB — CA 19-9 (SERIAL): CA 19-9: 102 U/mL — ABNORMAL HIGH (ref 0–35)

## 2022-06-28 LAB — MAGNESIUM: Magnesium: 1.9 mg/dL (ref 1.7–2.4)

## 2022-06-28 MED ORDER — IOHEXOL 300 MG/ML  SOLN
10.0000 mL | Freq: Once | INTRAMUSCULAR | Status: AC | PRN
  Administered 2022-06-28: 10 mL

## 2022-06-28 MED ORDER — LIDOCAINE HCL 1 % IJ SOLN
INTRAMUSCULAR | Status: AC
  Administered 2022-06-28: 10 mL
  Filled 2022-06-28: qty 20

## 2022-06-28 MED ORDER — FENTANYL CITRATE (PF) 100 MCG/2ML IJ SOLN
INTRAMUSCULAR | Status: AC | PRN
  Administered 2022-06-28 (×2): 25 ug via INTRAVENOUS
  Administered 2022-06-28: 50 ug via INTRAVENOUS

## 2022-06-28 MED ORDER — FENTANYL CITRATE (PF) 100 MCG/2ML IJ SOLN
INTRAMUSCULAR | Status: AC
  Filled 2022-06-28: qty 2

## 2022-06-28 MED ORDER — MIDAZOLAM HCL 2 MG/2ML IJ SOLN
INTRAMUSCULAR | Status: AC | PRN
  Administered 2022-06-28 (×2): .5 mg via INTRAVENOUS
  Administered 2022-06-28: 1 mg via INTRAVENOUS

## 2022-06-28 MED ORDER — SODIUM CHLORIDE 0.9 % IV SOLN
2.0000 g | INTRAVENOUS | Status: AC
  Filled 2022-06-28: qty 2

## 2022-06-28 MED ORDER — SODIUM CHLORIDE 0.9% FLUSH
5.0000 mL | Freq: Three times a day (TID) | INTRAVENOUS | Status: DC
  Administered 2022-06-28 – 2022-07-04 (×18): 5 mL

## 2022-06-28 MED ORDER — MIDAZOLAM HCL 2 MG/2ML IJ SOLN
INTRAMUSCULAR | Status: AC
  Filled 2022-06-28: qty 2

## 2022-06-28 MED ORDER — SODIUM CHLORIDE 0.9 % IV SOLN
INTRAVENOUS | Status: AC | PRN
  Administered 2022-06-28: 2 g via INTRAVENOUS

## 2022-06-28 NOTE — TOC Progression Note (Signed)
Transition of Care [facility]) - Progression Note    Patient Details  Name: Roberta Hampton MRN: [id number] Date of Birth: November 21, 1965  Transition of Care [facility]) CM/SW [place], RN Phone Number: 06/28/2022, 10:50 AM  Clinical Narrative:     TOC continues to monitor and follow to assist with needs and DC planning       Expected Discharge Plan and Services                                                 Social Determinants of Health (SDOH) Interventions    Readmission Risk Interventions     No data to display

## 2022-06-28 NOTE — Procedures (Addendum)
Vascular and Interventional Radiology Procedure Note  Patient: Roberta Hampton DOB: 1966/03/01 Medical Record Number: [id number] Note Date/Time: 06/28/22 1:14 PM   Performing Physician: Michaelle Birks, MD Assistant(s): None  Diagnosis: Biliary obstruction.  Procedure: INTERNAL / EXTERNAL BILIARY DRAINAGE CATHETER PLACEMENT  Anesthesia: Conscious Sedation Complications: None Estimated Blood Loss: Minimal Specimens: Sent for Cytology (bile)  Findings:  Successful Ultrasound and Fluoroscopy-guided placement of 10 F, 40 cm I/E biliary drainage catheter via a LEFT hepatic approach.  Plan:  - Flush drain with 5 mL Normal Saline every 8 hours. - Follow up drain evaluation and exchange in 8 week(s).  See detailed procedure note with images in PACS. The patient tolerated the procedure well without incident or complication and was returned to Floor Bed in stable condition.    Michaelle Birks, MD [facility] Rad[facility]   Pager. [place]

## 2022-06-28 NOTE — Progress Notes (Addendum)
[facility]                                                                              Roberta Hampton, is a 56 y.o. female, DOB - 1966/04/29, YWV:[id number] Admit date - 06/23/2022    Outpatient Primary MD for the patient is [facility] [place]  LOS - 5  days  Chief Complaint  Patient presents with   Abnormal Labs       Brief summary   56 year old with history of seizure disorder, ADD, MR with developmental delay comes to the ED for acute onset of jaundice and abnormal lab at PCP office.  Patient also had dark tea colored urine during this time.  Recently was diagnosed with UTI on 10/11 treated with Macrobid.  Upon admission had transaminitis, right upper quadrant ultrasound showed distended gallbladder with dilated CBD 9 mm, no evidence of cholelithiasis.  MRCP shows potential pancreatic head mass with multiple liver lesion.  GI consulted.  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 -CT and MRCP showed distended CBD, pancreatic head mass and multiple liver lesions -GI consulted, underwent ERCP on 10/20, showed malignant duodenal mass, dilation performed in the second portion of duodenum -Interventional radiology consulted, underwent biliary drainage catheter placement today, follow cytology - d/w GI, Dr Allen Norris, no further GI work-up at this time, recommended oncology work-up -Oncology consulted, msg sent to Dr Grayland Ormond  - per Dr Maryelizabeth Kaufmann, IR, she will need liver biopsy, can give a few days to normalize and then IR can do a liver biopsy.    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[place]) -  Stable, continue Depakote   Code Status: Full code DVT Prophylaxis:  enoxaparin (LOVENOX) injection 40 mg Start: 06/24/22 0800   Level of Care: Level of care: Med-Surg Family Communication: updated patient's mother on phone on 10/23 Disposition Plan:      Remains inpatient appropriate:   Procedures:  ERCP  Consultants:   GI IR  Antimicrobials:   Anti-infectives (From admission, onward)    Start     Dose/Rate Route Frequency Ordered Stop   06/28/22 1300  cefOXitin (MEFOXIN) 2 g in sodium chloride 0.9 % 100 mL IVPB        2 g 200 mL/hr over 30 Minutes Intravenous On call 06/28/22 1201 06/29/22 1300   06/28/22 1219  cefOXitin (MEFOXIN) 2 g in sodium chloride 0.9 % 100 mL IVPB        over 30 Minutes  Continuous PRN 06/28/22 1219 06/28/22 1219   06/27/22 0800  cefOXitin (MEFOXIN) 2 g in sodium chloride 0.9 % 100 mL IVPB       Note to Pharmacy: To radiology for procedure   2 g 200 mL/hr over 30 Minutes Intravenous On call 06/24/22 1717 06/28/22 0800          Medications  calcium-vitamin D   Oral BID WC   cyanocobalamin  2,000 mcg Oral Daily   diclofenac  100 mg Rectal Once   docusate  50 mg Oral BID   enoxaparin (LOVENOX) injection  40 mg Subcutaneous Q24H   feeding supplement   Oral BID BM   fentaNYL       folic acid  1 mg Oral Daily   midazolam       mirabegron ER  25 mg Oral Daily   multivitamin with minerals   Oral Daily   polyethylene glycol  17 g Oral Daily   QUEtiapine  800 mg Oral QHS   risperidone  2 mg Oral BID   valproic acid  500 mg Oral Daily   And   valproic acid  375 mg Oral QHS   vitamin E  200 Units Oral Daily      Subjective:   Roberta Hampton was seen and examined today AM prior to the procedure.  No complaints, no pain, nausea vomiting or fevers.  Resting comfortably.  Objective:   Vitals:   06/28/22 1240 06/28/22 1241 06/28/22 1245 06/28/22 1256  BP:  125/89 119/83 (!) 188/74  Pulse: 98 98 98 88  Resp: '13 11 16 16  '$ Temp:       TempSrc:      SpO2: 93% 93% 93% 94%   No intake or output data in the 24 hours ending 06/28/22 1411    Wt Readings from Last 3 Encounters:  06/24/22 57.6 kg  02/28/22 57.7 kg  02/04/22 58.1 kg   Physical Exam General: sleepy but easily arousable, NAD, comfortable Cardiovascular: S1 S2 clear, RRR.  Respiratory: CTAB, no wheezing Gastrointestinal: Soft, nontender, nondistended, NBS Ext: no pedal edema bilaterally Psych: appears close to her baseline   Data Reviewed:  I have personally reviewed following labs    CBC Lab Results  Component Value Date   WBC 5.2 06/28/2022   RBC 3.51 (L) 06/28/2022   HGB 11.3 (L) 06/28/2022   HCT 33.8 (L) 06/28/2022   MCV 96.3 06/28/2022   MCH 32.2 06/28/2022   PLT 159 06/28/2022   MCHC 33.4 06/28/2022   RDW 15.2 06/28/2022   LYMPHSABS 2.3 06/27/2022   MONOABS 1.2 (H) 06/27/2022   EOSABS 0.0 06/27/2022   BASOSABS 0.0 09/62/8366     Last metabolic panel Lab Results  Component Value Date   NA 139 06/28/2022   K 3.8 06/28/2022   CL 107 06/28/2022   CO2 28 06/28/2022   BUN 11 06/28/2022   CREATININE 0.32 (L) 06/28/2022   GLUCOSE 80 06/28/2022   GFRNONAA >60 06/28/2022   GFRAA 114 05/08/2019   CALCIUM 8.9 06/28/2022   PROT 4.9 (L) 06/28/2022   ALBUMIN 2.2 (L) 06/28/2022   LABGLOB 2.2 02/04/2022   AGRATIO 2.1 05/08/2019   BILITOT 12.3 (H) 06/28/2022   ALKPHOS 502 (H) 06/28/2022   AST 108 (H) 06/28/2022   ALT 93 (H) 06/28/2022   ANIONGAP 4 (L) 06/28/2022    CBG (last 3)  Recent Labs    06/27/22 1736 06/28/22 0157 06/28/22 0558  GLUCAP 129* 91 84      Coagulation Profile: Recent Labs  Lab 06/25/22 0452 06/27/22 0309  INR 1.6* 2.2*     Radiology Studies: I have personally reviewed the imaging studies  No results found.     Estill Cotta M.D. [facility] 06/28/2022, 2:11 PM  Available via Epic secure chat 7am-7pm After 7 pm, please refer to night coverage provider listed on amion.

## 2022-06-28 NOTE — Plan of Care (Signed)

## 2022-06-28 NOTE — Progress Notes (Signed)
Patient clinically stable post Biliary  10 FR drain placement per Dr Maryelizabeth Kaufmann, denies complaints at present. Update given to Mother at bedside post procedure/recovery. Vital stable pre and post procedure. Received Versed 2 mg along with Fentanyl 100 mcg IV  for procedure. Received mefoxin 2g IV for procedure. Report given to Pt's care nurse post procedure at bedside.

## 2022-06-29 DIAGNOSIS — K851 Biliary acute pancreatitis without necrosis or infection: Secondary | ICD-10-CM | POA: Diagnosis not present

## 2022-06-29 DIAGNOSIS — R6250 Unspecified lack of expected normal physiological development in childhood: Secondary | ICD-10-CM | POA: Diagnosis not present

## 2022-06-29 DIAGNOSIS — G40319 Generalized idiopathic epilepsy and epileptic syndromes, intractable, without status epilepticus: Secondary | ICD-10-CM | POA: Diagnosis not present

## 2022-06-29 DIAGNOSIS — R17 Unspecified jaundice: Secondary | ICD-10-CM | POA: Diagnosis not present

## 2022-06-29 DIAGNOSIS — K8050 Calculus of bile duct without cholangitis or cholecystitis without obstruction: Secondary | ICD-10-CM | POA: Diagnosis not present

## 2022-06-29 DIAGNOSIS — K869 Disease of pancreas, unspecified: Secondary | ICD-10-CM

## 2022-06-29 DIAGNOSIS — K7689 Other specified diseases of liver: Secondary | ICD-10-CM

## 2022-06-29 LAB — COMPREHENSIVE METABOLIC PANEL
ALT: 120 U/L — ABNORMAL HIGH (ref 0–44)
AST: 171 U/L — ABNORMAL HIGH (ref 15–41)
Albumin: 2.4 g/dL — ABNORMAL LOW (ref 3.5–5.0)
Alkaline Phosphatase: 940 U/L — ABNORMAL HIGH (ref 38–126)
Anion gap: 9 (ref 5–15)
BUN: 21 mg/dL — ABNORMAL HIGH (ref 6–20)
CO2: 24 mmol/L (ref 22–32)
Calcium: 8.7 mg/dL — ABNORMAL LOW (ref 8.9–10.3)
Chloride: 106 mmol/L (ref 98–111)
Creatinine, Ser: 0.55 mg/dL (ref 0.44–1.00)
GFR, Estimated: >60 mL/min (ref >60)
Glucose, Bld: 86 mg/dL (ref 70–99)
Potassium: 4 mmol/L (ref 3.5–5.1)
Sodium: 139 mmol/L (ref 135–145)
Total Bilirubin: 12.7 mg/dL — ABNORMAL HIGH (ref 0.3–1.2)
Total Protein: 5.2 g/dL — ABNORMAL LOW (ref 6.5–8.1)

## 2022-06-29 LAB — CBC
HCT: 37.4 % (ref 36.0–46.0)
Hemoglobin: 12.3 g/dL (ref 12.0–15.0)
MCH: 31.9 pg (ref 26.0–34.0)
MCHC: 32.9 g/dL (ref 30.0–36.0)
MCV: 96.9 fL (ref 80.0–100.0)
Platelets: 178 10*3/uL (ref 150–400)
RBC: 3.86 MIL/uL — ABNORMAL LOW (ref 3.87–5.11)
RDW: 14.9 % (ref 11.5–15.5)
WBC: 5.2 10*3/uL (ref 4.0–10.5)
nRBC: 0 % (ref 0.0–0.2)

## 2022-06-29 LAB — MAGNESIUM: Magnesium: 2.1 mg/dL (ref 1.7–2.4)

## 2022-06-29 LAB — GLUCOSE, CAPILLARY
Glucose-Capillary: 81 mg/dL (ref 70–99)
Glucose-Capillary: 93 mg/dL (ref 70–99)

## 2022-06-29 NOTE — Consult Note (Signed)
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  INTERVAL HISTORY: Patient is a 56 year old female who presented with painless jaundice.  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  Patient has an intellectual disability as well as seizure disorder and review of systems is unobtainable.  Much of the history is given by her mother who is at the bedside.  REVIEW OF SYSTEMS:   Review of Systems  Unable to perform ROS: Mental acuity    PAST MEDICAL HISTORY: Past Medical History:  Diagnosis Date   Attention deficit disorder    Hyponatremia    associated with polydipsia   Leukopenia    Mental retardation    Obesity    Onychomycosis    Seizure disorder ([place])    Seizures (HCC)    Thrombocytopenia (HCC)     PAST SURGICAL HISTORY: Past Surgical History:  Procedure Laterality Date   ABDOMINAL HYSTERECTOMY     ENDOSCOPIC RETROGRADE CHOLANGIOPANCREATOGRAPHY (ERCP) WITH PROPOFOL N/A 06/24/2022   Procedure: ENDOSCOPIC RETROGRADE CHOLANGIOPANCREATOGRAPHY (ERCP) WITH PROPOFOL;  Surgeon: Lucilla Lame, MD;  Location: [facility] ENDOSCOPY;  Service: Endoscopy;  Laterality: N/A;   IR INT EXT BILIARY DRAIN WITH CHOLANGIOGRAM  06/28/2022   ORIF ANKLE FRACTURE Left 05/08/2015   Procedure: OPEN REDUCTION INTERNAL FIXATION (ORIF) ANKLE FRACTURE;  Surgeon: Corky Mull, MD;  Location: [facility] ORS;  Service: Orthopedics;  Laterality: Left;   VAGINAL HYSTERECTOMY      FAMILY HISTORY: Family History  Problem Relation Age of Onset   Asthma Brother    Seizures Other        paternal cousin   Breast cancer Neg Hx     ADVANCED  DIRECTIVES (Y/N):  '@ADVDIR'$ @  HEALTH MAINTENANCE: Social History   Tobacco Use   Smoking status: Never   Smokeless tobacco: Never  Vaping Use   Vaping Use: Never used  Substance Use Topics   Alcohol use: No   Drug use: No     Colonoscopy:  PAP:  Bone density:  Lipid panel:  No Known Allergies  Current Facility-Administered Medications  Medication Dose Route Frequency Provider Last Rate Last Admin   acetaminophen (TYLENOL) tablet 650 mg  650 mg Oral Q6H PRN Lucilla Lame, MD   650 mg at 06/28/22 2042   Or   acetaminophen (TYLENOL) suppository 650 mg  650 mg Rectal Q6H PRN Lucilla Lame, MD       calcium-vitamin D (OSCAL WITH D) 500-5 MG-MCG per tablet   Oral BID WC Lucilla Lame, MD   1 tablet at 06/29/22 4481   cyanocobalamin (VITAMIN B12) tablet 2,000 mcg  2,000 mcg Oral Daily Lucilla Lame, MD   2,000 mcg at 06/29/22 8563   diclofenac suppository 100 mg  100 mg Rectal Once Lucilla Lame, MD       enoxaparin (LOVENOX) injection 40 mg  40 mg Subcutaneous Q24H Lucilla Lame, MD   40 mg at 06/29/22 1497   feeding supplement (ENSURE ENLIVE / ENSURE PLUS) liquid   Oral BID BM Lucilla Lame, MD   Given at 02/63/78 5885   folic acid (FOLVITE) tablet 1 mg  1 mg Oral Daily Lucilla Lame, MD   1 mg at 06/29/22 0903   guaiFENesin (ROBITUSSIN) 100 MG/5ML  liquid 5 mL  5 mL Oral Q4H PRN Lucilla Lame, MD       ipratropium-albuterol (DUONEB) 0.5-2.5 (3) MG/3ML nebulizer solution 3 mL  3 mL Nebulization Q4H PRN Lucilla Lame, MD       magnesium hydroxide (MILK OF MAGNESIA) suspension 30 mL  30 mL Oral Daily PRN Lucilla Lame, MD   30 mL at 06/25/22 1704   metoprolol tartrate (LOPRESSOR) injection 5 mg  5 mg Intravenous Q4H PRN Lucilla Lame, MD       mirabegron ER (MYRBETRIQ) tablet 25 mg  25 mg Oral Daily Lucilla Lame, MD   25 mg at 06/29/22 4259   multivitamin with minerals tablet   Oral Daily Lucilla Lame, MD   1 tablet at 06/29/22 0903   ondansetron (ZOFRAN) injection 4 mg  4 mg Intravenous Q6H PRN  Lucilla Lame, MD       ondansetron (ZOFRAN) tablet 4 mg  4 mg Oral Q6H PRN Lucilla Lame, MD       Oral care mouth rinse  15 mL Mouth Rinse PRN Rai, Ripudeep K, MD       polyethylene glycol (MIRALAX / GLYCOLAX) packet 17 g  17 g Oral Daily Lucilla Lame, MD   17 g at 06/28/22 5638   QUEtiapine (SEROQUEL) tablet 800 mg  800 mg Oral QHS Lucilla Lame, MD   800 mg at 06/28/22 2043   risperiDONE (RISPERDAL) tablet 2 mg  2 mg Oral BID Lucilla Lame, MD   2 mg at 06/29/22 0902   sodium chloride flush (NS) 0.9 % injection 5 mL  5 mL Intracatheter Q8H Mugweru, Jon, MD   5 mL at 06/29/22 1307   traZODone (DESYREL) tablet 25 mg  25 mg Oral QHS PRN Lucilla Lame, MD       valproic acid (DEPAKENE) 250 MG/5ML solution 500 mg  500 mg Oral Daily Lucilla Lame, MD   500 mg at 06/29/22 7564   And   valproic acid (DEPAKENE) 250 MG/5ML solution 375 mg  375 mg Oral QHS Lucilla Lame, MD   375 mg at 06/28/22 2047   vitamin E capsule 200 Units  200 Units Oral Daily Lucilla Lame, MD   200 Units at 06/29/22 0902    OBJECTIVE: Vitals:   06/29/22 0613 06/29/22 0740  BP: 109/69 109/67  Pulse: 83 80  Resp: 16 18  Temp: 98.8 F (37.1 C) 98.8 F (37.1 C)  SpO2: 98% 98%     There is no height or weight on file to calculate BMI.    ECOG FS:1 - Symptomatic but completely ambulatory  General: Well-developed, well-nourished, no acute distress. Eyes: Pink conjunctiva, anicteric sclera. HEENT: Normocephalic, moist mucous membranes. Lungs: No audible wheezing or coughing. Heart: Regular rate and rhythm. Abdomen: Soft, nontender, no obvious distention. Musculoskeletal: No edema, cyanosis, or clubbing. Neuro: Alert, Cranial nerves grossly intact. Skin: No rashes or petechiae noted.  LAB RESULTS:  Lab Results  Component Value Date   NA 139 06/29/2022   K 4.0 06/29/2022   CL 106 06/29/2022   CO2 24 06/29/2022   GLUCOSE 86 06/29/2022   BUN 21 (H) 06/29/2022   CREATININE 0.55 06/29/2022   CALCIUM 8.7 (L) 06/29/2022    PROT 5.2 (L) 06/29/2022   ALBUMIN 2.4 (L) 06/29/2022   AST 171 (H) 06/29/2022   ALT 120 (H) 06/29/2022   ALKPHOS 940 (H) 06/29/2022   BILITOT 12.7 (H) 06/29/2022   GFRNONAA >60 06/29/2022   GFRAA 114 05/08/2019    Lab Results  Component Value Date   WBC 5.2 06/29/2022   NEUTROABS 2.5 06/27/2022   HGB 12.3 06/29/2022   HCT 37.4 06/29/2022   MCV 96.9 06/29/2022   PLT 178 06/29/2022     STUDIES: IR INT EXT BILIARY DRAIN WITH CHOLANGIOGRAM  Result Date: 06/28/2022 INDICATION: Jaundice.  Question malignant biliary obstruction. EXAM: PERCUTANEOUS TRANSHEPATIC BILIARY TUBE PLACEMENT and ANTEROGRADE CHOLANGIOGRAM COMPARISON:  MR abdomen/MRCP, 06/24/2022. US Abdomen, 06/23/2022. PET-CT, 02/23/2022. MEDICATIONS: Cefoxitin, 2 g IV; The antibiotic was administered with an appropriate time frame prior to the initiation of the procedure CONTRAST:  10m OMNIPAQUE IOHEXOL 300 MG/ML SOLN, 152mOMNIPAQUE IOHEXOL 300 MG/ML SOLN - administered into the biliary tree. ANESTHESIA/SEDATION: Moderate (conscious) sedation was employed during this procedure. A total of Versed 2 mg and Fentanyl 100 mcg was administered intravenously. Moderate Sedation Time: 41 minutes. The patient's level of consciousness and vital signs were monitored continuously by radiology nursing throughout the procedure under my direct supervision. FLUOROSCOPY TIME:  Fluoroscopic dose; 8551.0Gy COMPLICATIONS: None immediate. TECHNIQUE: Informed written consent was obtained from the patient and/or patient's representative after a discussion of the risks, benefits and alternatives to treatment. Questions regarding the procedure were encouraged and answered. A timeout was performed prior to the initiation of the procedure. The epigastrium and RIGHT upper abdominal quadrant was prepped and draped in the usual sterile fashion, and a sterile drape was applied covering the operative field. Maximum barrier sterile technique with sterile gowns and gloves  were used for the procedure. A timeout was performed prior to the initiation of the procedure. Ultrasound scanning of the right upper abdominal quadrant was performed to delineate the anatomy and avoid transgression of the gallbladder or the pleural. A spot along the subcostal epigastrium was marked sonographically. After the overlying soft tissues were anesthetized with 1% Lidocaine with epinephrine, under direct ultrasound guidance, a 22 gauge Chiba needle was utilized to cannulate the peripheral aspect of a LEFT intrahepatic biliary duct. Appropriate position was confirmed with limited contrast injection. Next, the duct was cannulated with a Nitrex wire and dilated with an Accustick set under fluoroscopic guidance. Limited cholangiograms were performed in various obliquities confirming appropriate access. Next, the outer sheath of the Accustick set and with the use of a stiff Glidewire, advanced through the biliary hilum, common bile duct and ampulla to the level of the duodenum. Contrast injection confirmed appropriate positioning. Under intermittent fluoroscopic guidance and over an Amplatz wire, the track was dilated ultimately allowing placement of a 10 Fr biliary drainage catheter with coil ultimately locked within the duodenum. Contrast was injected and a completion radiographs were obtained in various obliquities. The catheter was connected to a drainage bag which yielded the brisk return of clear bile. The catheter was secured to the skin with an interrupted suture and StatLock device. Dressings were applied. The patient tolerated the procedure well without immediate postprocedural complication. FINDINGS: Sonographic evaluation of the liver demonstrates marked intrahepatic biliary ductal dilatation as was demonstrated on preceding cross-sectional imaging, including MRCP. Under direct ultrasound guidance, a dilated peripheral duct within the LEFT lobe of liver was accessed allowing placement of a 10 Fr  biliary drainage catheter with end ultimately coiled and locked within the duodenum and radiopaque side marker located proximal to the level of the biliary hilum. Limited contrast injection demonstrates marked dilatation of the CBD and intrahepatic biliary tree with communication between the right and left biliary trees at the level of the hilum. IMPRESSION: 1. Cholangiogram demonstrating marked intrahepatic and extrahepatic biliary ductal dilatation,  similar to findings on corresponding MRCP. 2. Successful placement of a 10 Fr percutaneous biliary drainage catheter via LEFT transhepatic approach, with marker located proximal to the level of the biliary hilum and biliary catheter tip within the duodenum, as described above. PLAN: Patient will return to [facility] ([facility]) for routine drainage catheter evaluation and exchange in 8 weeks. Michaelle Birks, MD [facility] Electronically Signed   By: Michaelle Birks M.D.   On: 06/28/2022 15:20   DG C-Arm 1-60 Min-No Report  Result Date: 06/24/2022 Fluoroscopy was utilized by the requesting physician.  No radiographic interpretation.   MR ABDOMEN MRCP WO CONTRAST  Result Date: 06/24/2022 CLINICAL DATA:  56 year old female with history of jaundice. EXAM: MRI ABDOMEN WITHOUT CONTRAST  (INCLUDING MRCP) TECHNIQUE: Multiplanar multisequence MR imaging of the abdomen was performed. Heavily T2-weighted images of the biliary and pancreatic ducts were obtained, and three-dimensional MRCP images were rendered by post processing. COMPARISON:  No prior abdominal MRI. Abdominal ultrasound 06/23/2022. PET-CT 02/23/2022. CT of the chest, abdomen and pelvis 01/06/2022. FINDINGS: Comment: Today's study is severely limited for detection and characterization of visceral and/or vascular lesions by lack of IV gadolinium. Lower chest: Small bilateral pleural effusions lie dependently. Hepatobiliary: Multiple  poorly defined hepatic lesions are noted which are low T1 signal and mildly high T2 signal intensity, incompletely evaluated on today's noncontrast examination, but new compared to prior PET-CT and CT examinations. The largest of these lesions measure up to 2.1 x 1.0 cm in the periphery of segment 5 (axial image 21 of series 4) and 1.9 x 1.3 cm in the central aspect of segment 7 (axial image 12 of series 4). There is a suggestion of some peripheral diffusion restriction for several of these lesions. Severe intra and extrahepatic biliary ductal dilatation noted, new compared to the prior examination, measuring up to 12 mm in the porta hepatis. Abrupt cut off of the distal common bile duct immediately before the level of the ampulla. No filling defect in the common bile duct to suggest choledocholithiasis. Gallbladder is severely distended. Gallbladder wall does not appear thickened. No pericholecystic fluid or surrounding inflammatory changes. Pancreas: In the head of the pancreas (axial image 55 of series 10) there is an area of potential diffusion restriction. This is poorly demonstrated on today's noncontrast images, and difficult to discretely measure, but concerning for potential pancreatic head mass. Associated with this there is dilatation of the main pancreatic duct which measures up to 6 mm in the body of the pancreas, along with diffuse side branch ectasia. No peripancreatic fluid collections or inflammatory changes. Spleen: Multiple T1 and T2 hypointense spleen lesions, incompletely characterized on today's noncontrast examination, but measuring up to 2.3 cm (axial image 7 of series 4). Adrenals/Urinary Tract: No suspicious renal lesions are noted on today's noncontrast examination. 8 mm T1 hypointense, T2 hyperintense lesion in the upper pole of the left kidney posteriorly, statistically likely a small cysts (no imaging follow-up recommended). Bilateral extrarenal pelvises. 2 filling defects are noted in  the right renal pelvis on axial image 20 of series 4 and 22 of series 4, potentially calculi. Bilateral adrenal glands are normal in appearance. Stomach/Bowel: Visualized portions are unremarkable. Vascular/Lymphatic: No aneurysm identified in the visualized abdominal vasculature on today's noncontrast examination. Other: No significant volume of ascites noted in the visualized portions of the peritoneal cavity. Musculoskeletal: No aggressive appearing osseous lesions are noted in the visualized portions of the skeleton. IMPRESSION: 1. Severely limited examination secondary  to lack of IV gadolinium. With these limitations in mind, there is severe intra and extrahepatic biliary ductal dilatation, along with diffuse pancreatic ductal dilatation, and there is a suggestion of a mass in the head of the pancreas based on diffusion weighted images concerning for malignancy. Further evaluation with repeat abdominal MRI with and without IV gadolinium with MRCP is recommended to better evaluate this finding. Alternatively, endoscopic ultrasound and tissue sampling could be considered if clinically appropriate. 2. Multiple indeterminate liver lesions, poorly characterized on today's noncontrast examination, but new compared to prior CT and PET-CT examinations. The possibility of metastatic disease in the liver should be considered. Other aggressive etiology such as multifocal hepatic abscesses could also be considered in the appropriate clinical setting, however, the degree of diffusion restriction noted with these lesions is less than would be expected for abscesses. 3. Multiple indeterminate splenic lesions stable compared to prior CT examinations and not associated with diffusion restriction, likely benign lesions. 4. Small bilateral pleural effusions lying dependently. Electronically Signed   By: Vinnie Langton M.D.   On: 06/24/2022 06:19   MR 3D Recon At Scanner  Result Date: 06/24/2022 CLINICAL DATA:  56 year old  female with history of jaundice. EXAM: MRI ABDOMEN WITHOUT CONTRAST  (INCLUDING MRCP) TECHNIQUE: Multiplanar multisequence MR imaging of the abdomen was performed. Heavily T2-weighted images of the biliary and pancreatic ducts were obtained, and three-dimensional MRCP images were rendered by post processing. COMPARISON:  No prior abdominal MRI. Abdominal ultrasound 06/23/2022. PET-CT 02/23/2022. CT of the chest, abdomen and pelvis 01/06/2022. FINDINGS: Comment: Today's study is severely limited for detection and characterization of visceral and/or vascular lesions by lack of IV gadolinium. Lower chest: Small bilateral pleural effusions lie dependently. Hepatobiliary: Multiple poorly defined hepatic lesions are noted which are low T1 signal and mildly high T2 signal intensity, incompletely evaluated on today's noncontrast examination, but new compared to prior PET-CT and CT examinations. The largest of these lesions measure up to 2.1 x 1.0 cm in the periphery of segment 5 (axial image 21 of series 4) and 1.9 x 1.3 cm in the central aspect of segment 7 (axial image 12 of series 4). There is a suggestion of some peripheral diffusion restriction for several of these lesions. Severe intra and extrahepatic biliary ductal dilatation noted, new compared to the prior examination, measuring up to 12 mm in the porta hepatis. Abrupt cut off of the distal common bile duct immediately before the level of the ampulla. No filling defect in the common bile duct to suggest choledocholithiasis. Gallbladder is severely distended. Gallbladder wall does not appear thickened. No pericholecystic fluid or surrounding inflammatory changes. Pancreas: In the head of the pancreas (axial image 55 of series 10) there is an area of potential diffusion restriction. This is poorly demonstrated on today's noncontrast images, and difficult to discretely measure, but concerning for potential pancreatic head mass. Associated with this there is  dilatation of the main pancreatic duct which measures up to 6 mm in the body of the pancreas, along with diffuse side branch ectasia. No peripancreatic fluid collections or inflammatory changes. Spleen: Multiple T1 and T2 hypointense spleen lesions, incompletely characterized on today's noncontrast examination, but measuring up to 2.3 cm (axial image 7 of series 4). Adrenals/Urinary Tract: No suspicious renal lesions are noted on today's noncontrast examination. 8 mm T1 hypointense, T2 hyperintense lesion in the upper pole of the left kidney posteriorly, statistically likely a small cysts (no imaging follow-up recommended). Bilateral extrarenal pelvises. 2 filling defects are noted in  the right renal pelvis on axial image 20 of series 4 and 22 of series 4, potentially calculi. Bilateral adrenal glands are normal in appearance. Stomach/Bowel: Visualized portions are unremarkable. Vascular/Lymphatic: No aneurysm identified in the visualized abdominal vasculature on today's noncontrast examination. Other: No significant volume of ascites noted in the visualized portions of the peritoneal cavity. Musculoskeletal: No aggressive appearing osseous lesions are noted in the visualized portions of the skeleton. IMPRESSION: 1. Severely limited examination secondary to lack of IV gadolinium. With these limitations in mind, there is severe intra and extrahepatic biliary ductal dilatation, along with diffuse pancreatic ductal dilatation, and there is a suggestion of a mass in the head of the pancreas based on diffusion weighted images concerning for malignancy. Further evaluation with repeat abdominal MRI with and without IV gadolinium with MRCP is recommended to better evaluate this finding. Alternatively, endoscopic ultrasound and tissue sampling could be considered if clinically appropriate. 2. Multiple indeterminate liver lesions, poorly characterized on today's noncontrast examination, but new compared to prior CT and PET-CT  examinations. The possibility of metastatic disease in the liver should be considered. Other aggressive etiology such as multifocal hepatic abscesses could also be considered in the appropriate clinical setting, however, the degree of diffusion restriction noted with these lesions is less than would be expected for abscesses. 3. Multiple indeterminate splenic lesions stable compared to prior CT examinations and not associated with diffusion restriction, likely benign lesions. 4. Small bilateral pleural effusions lying dependently. Electronically Signed   By: Vinnie Langton M.D.   On: 06/24/2022 06:19   US ABDOMEN LIMITED RUQ (LIVER/GB)  Result Date: 06/23/2022 CLINICAL DATA:  Elevated LFTs EXAM: ULTRASOUND ABDOMEN LIMITED RIGHT UPPER QUADRANT COMPARISON:  CT 01/06/2022 FINDINGS: Gallbladder: Distended gallbladder. No gallstones or wall thickening visualized. No sonographic Murphy sign noted by sonographer. Common bile duct: Diameter: 9 mm Liver: No focal lesion identified. Within normal limits in parenchymal echogenicity. Portal vein is patent on color Doppler imaging with normal direction of blood flow towards the liver. Other: None. IMPRESSION: Distended gallbladder with dilation of the common bile duct measuring 9 mm. This is grossly similar to PET/CT 02/23/2022 given differences in technique. Consider MRI/MRCP for further evaluation if there is concern for obstructing stone or mass. No evidence of cholecystitis. Electronically Signed   By: Placido Sou M.D.   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  Pathology is pending at time of dictation.  Will order CA 19-9 for completeness.  Patient's mother reports she is to have a biopsy to confirm diagnosis on Friday.  If malignancy is confirmed,  unlikely any treatments would be possible and patient's mother has already indicated that she would not want to pursue chemotherapy.  Hospice and comfort care were briefly discussed.  Will consult palliative care. Hyperbilirubinemia: Likely secondary to underlying malignancy.  Patient's most recent total bilirubin was 12.7.  Appreciate consult, will follow.   Lloyd Huger, MD   06/29/2022 2:15 PM

## 2022-06-29 NOTE — Plan of Care (Signed)

## 2022-06-29 NOTE — Progress Notes (Signed)
[facility]                                                                              Roberta Hampton, is a 56 y.o. female, DOB - 02-25-1966, OEU:[id number] Admit date - 06/23/2022    Outpatient Primary MD for the patient is [facility] [place]  LOS - 6  days  Chief Complaint  Patient presents with   Abnormal Labs       Brief summary   56 year old with history of seizure disorder, ADD, developmental delay comes to the ED for acute onset of jaundice and abnormal lab at PCP office.  Patient also had dark tea colored urine during this time.  Recently was diagnosed with UTI on 10/11 treated with Macrobid.  Upon admission had transaminitis, right upper quadrant ultrasound showed distended gallbladder with dilated CBD 9 mm, no evidence of cholelithiasis.  MRCP shows potential pancreatic head mass with multiple liver lesion.  GI consulted.  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 Plan to obtain liver Bx in a few days -Oncology consulted 10/24, Dr Grayland Ormond - following liver labs - diet as tolerated - daily weights - monitor drain   Depression, Developmental delay- Stable, -  continue home meds: Luvox, Seroquel and Risperdal   Generalized convulsive epilepsy with intractable epilepsy ([place]) - Stable, continue Depakote  Code Status: Full code DVT Prophylaxis:  enoxaparin (LOVENOX) injection 40 mg Start: 06/24/22 0800  Level of Care: Level of care: Med-Surg Family Communication:  updated patient's mother at bedside and sister in law, [place], on the phone Disposition Plan:      Remains inpatient appropriate:   Procedures:  ERCP 10/20 Biliary drain placed by IR 10/24  Consultants:   GI IR Oncology   Antimicrobials:   Anti-infectives (From admission, onward)    Start     Dose/Rate Route Frequency Ordered Stop   06/28/22 1300  cefOXitin (MEFOXIN) 2 g in sodium chloride 0.9 % 100 mL IVPB        2 g 200 mL/hr over 30 Minutes Intravenous On call 06/28/22 1201 06/29/22 1300   06/28/22 1219  cefOXitin (MEFOXIN) 2 g in sodium chloride 0.9 % 100 mL IVPB        over 30 Minutes  Continuous PRN 06/28/22 1219 06/28/22 1219   06/27/22 0800  cefOXitin (MEFOXIN) 2 g in sodium chloride 0.9 % 100 mL IVPB       Note to Pharmacy: To radiology for procedure   2 g 200 mL/hr over 30 Minutes Intravenous On call 06/24/22 1717 06/28/22 0800          Medications  calcium-vitamin D   Oral BID WC   cyanocobalamin  2,000 mcg Oral Daily   diclofenac  100 mg Rectal Once   docusate  50 mg Oral BID   enoxaparin (LOVENOX) injection  40 mg Subcutaneous Q24H   feeding supplement   Oral BID BM   folic acid  1 mg Oral Daily   mirabegron ER  25 mg Oral Daily   multivitamin with minerals   Oral Daily   polyethylene glycol  17 g Oral Daily   QUEtiapine  800 mg Oral QHS   risperidone  2 mg Oral BID   sodium chloride flush  5 mL Intracatheter Q8H   valproic acid  500 mg Oral Daily   And   valproic acid  375 mg Oral QHS   vitamin E  200 Units Oral Daily      Subjective:   Malinda Mayden was non-verbal throughout my visit but able to show acknowledgement when spoken to. Did not appear in acute distress. Was being fed by her mother.   Objective:   Vitals:   06/28/22 1632 06/29/22 0006 06/29/22 0613 06/29/22 0740  BP: 115/72 100/72 109/69 109/67  Pulse: 90 84 83 80  Resp:  '16 16 18  '$ Temp: (!) 97.5 F (36.4 C) 97.9 F (36.6 C) 98.8 F (37.1 C) 98.8 F (37.1 C)  TempSrc:       SpO2: 95% 96% 98% 98%    Intake/Output Summary (Last 24 hours) at 06/29/2022 1306 Last data filed at 06/29/2022 1013 Gross per 24 hour  Intake 625 ml  Output 410 ml  Net 215 ml      Wt Readings from Last 3 Encounters:  06/24/22 57.6 kg  02/28/22 57.7 kg  02/04/22 58.1 kg   Physical Exam General: NAD, comfortable Cardiovascular: quick capillary refill  Respiratory: CTAB, no wheezing Gastrointestinal: Soft, nontender, nondistended, NBS. Drain in place Ext: no pedal edema bilaterally Psych: appears close to her baseline, per mom   Data Reviewed:  I have personally reviewed following labs    CBC Lab Results  Component Value Date   WBC 5.2 06/29/2022   RBC 3.86 (L) 06/29/2022   HGB 12.3 06/29/2022   HCT 37.4 06/29/2022   MCV 96.9 06/29/2022   MCH 31.9 06/29/2022   PLT 178 06/29/2022   MCHC 32.9 06/29/2022   RDW 14.9 06/29/2022   LYMPHSABS 2.3 06/27/2022   MONOABS 1.2 (H) 06/27/2022   EOSABS 0.0 06/27/2022   BASOSABS 0.0 74/08/8785     Last metabolic panel Lab Results  Component Value Date   NA 139 06/29/2022   K 4.0 06/29/2022   CL 106 06/29/2022   CO2 24 06/29/2022   BUN 21 (H) 06/29/2022   CREATININE 0.55 06/29/2022   GLUCOSE 86 06/29/2022   GFRNONAA >60 06/29/2022   GFRAA 114 05/08/2019   CALCIUM 8.7 (L) 06/29/2022   PROT 5.2 (L) 06/29/2022   ALBUMIN 2.4 (L) 06/29/2022   LABGLOB 2.2 02/04/2022   AGRATIO 2.1 05/08/2019   BILITOT 12.7 (H) 06/29/2022   ALKPHOS 940 (H) 06/29/2022   AST 171 (H) 06/29/2022   ALT 120 (H) 06/29/2022   ANIONGAP 9 06/29/2022    CBG (last 3)  Recent Labs    06/28/22 0558 06/29/22 0008 06/29/22 0616  GLUCAP 84 93 81       Coagulation Profile: Recent Labs  Lab 06/25/22 0452 06/27/22 0309  INR 1.6* 2.2*      Radiology Studies: I have personally reviewed the imaging studies  IR INT EXT BILIARY DRAIN WITH CHOLANGIOGRAM  Result Date: 06/28/2022 INDICATION: Jaundice.  Question malignant biliary  obstruction. EXAM: PERCUTANEOUS TRANSHEPATIC BILIARY TUBE PLACEMENT and ANTEROGRADE CHOLANGIOGRAM COMPARISON:  MR abdomen/MRCP, 06/24/2022. US Abdomen, 06/23/2022. PET-CT, 02/23/2022. MEDICATIONS: Cefoxitin, 2 g IV; The antibiotic was administered with an appropriate time frame prior to the initiation of the procedure CONTRAST:  40m OMNIPAQUE IOHEXOL 300 MG/ML SOLN, 125mOMNIPAQUE IOHEXOL 300 MG/ML SOLN - administered into the biliary tree. ANESTHESIA/SEDATION: Moderate (conscious) sedation was employed during this procedure. A total of Versed 2 mg and Fentanyl 100 mcg was administered intravenously. Moderate Sedation Time: 41 minutes. The patient's level of consciousness and vital signs were monitored continuously by radiology nursing throughout the procedure under my direct supervision. FLUOROSCOPY TIME:  Fluoroscopic dose; 8569.6Gy COMPLICATIONS: None immediate. TECHNIQUE: Informed written consent was obtained from the patient and/or patient's representative after a discussion of the risks, benefits and alternatives to treatment. Questions regarding the procedure were encouraged and answered. A timeout was performed prior to the initiation of the procedure. The epigastrium and RIGHT upper abdominal quadrant was prepped and draped in the usual sterile fashion, and a sterile drape was applied covering the operative field. Maximum barrier sterile technique with sterile gowns and gloves were used for the procedure. A timeout was performed prior to the initiation of the procedure. Ultrasound scanning of the right upper abdominal quadrant was performed to delineate the anatomy and avoid transgression of the gallbladder or the pleural. A spot along the subcostal epigastrium was marked sonographically. After the overlying soft tissues were anesthetized with 1% Lidocaine with epinephrine, under direct ultrasound guidance, a 22 gauge Chiba needle was utilized to cannulate the peripheral aspect of a LEFT intrahepatic  biliary duct. Appropriate position was confirmed with limited contrast injection. Next, the duct was cannulated with a Nitrex wire and dilated with an Accustick set under fluoroscopic guidance. Limited cholangiograms were performed in various obliquities confirming appropriate access. Next, the outer sheath of the Accustick set and with the use of a stiff Glidewire, advanced through the biliary hilum, common bile duct and ampulla to the level of the duodenum. Contrast injection confirmed appropriate positioning. Under intermittent fluoroscopic guidance and over an Amplatz wire, the track was dilated ultimately allowing placement of a 10 Fr biliary drainage catheter with coil ultimately locked within the duodenum. Contrast was injected and a completion radiographs were obtained in various obliquities. The catheter was connected to a drainage bag which yielded the brisk return of clear bile. The catheter was secured to the skin with an interrupted suture and StatLock device. Dressings were applied. The patient tolerated the procedure well without immediate postprocedural complication. FINDINGS: Sonographic evaluation of the liver demonstrates marked intrahepatic biliary ductal dilatation as was demonstrated on preceding cross-sectional imaging, including MRCP. Under direct ultrasound guidance, a dilated peripheral duct within the LEFT lobe of liver was accessed allowing placement of a 10 Fr biliary drainage catheter with end ultimately coiled and locked within the duodenum and radiopaque side marker located proximal to the level of the biliary hilum. Limited contrast injection demonstrates marked dilatation of the CBD and intrahepatic biliary tree with communication between the right and left biliary trees at the level of the hilum. IMPRESSION: 1. Cholangiogram demonstrating marked intrahepatic and extrahepatic biliary ductal dilatation, similar to findings on corresponding MRCP. 2. Successful placement of a 10 Fr  percutaneous biliary drainage catheter via LEFT transhepatic approach, with marker located proximal to the level of the biliary hilum and biliary catheter tip within the duodenum, as described above. PLAN: Patient will return to [facility] ([facility]) for routine drainage catheter evaluation and exchange in 8 weeks. JoMichaelle BirksMD [facility] Electronically Signed   By: Michaelle Birks M.D.   On: 06/28/2022 15:20       Richarda Osmond M.D. [facility] 06/29/2022, 1:06 PM  Available via Epic secure chat 7am-7pm After 7 pm, please refer to night coverage provider listed on amion.

## 2022-06-30 ENCOUNTER — Inpatient Hospital Stay: Payer: Medicare Other

## 2022-06-30 DIAGNOSIS — G40319 Generalized idiopathic epilepsy and epileptic syndromes, intractable, without status epilepticus: Secondary | ICD-10-CM | POA: Diagnosis not present

## 2022-06-30 DIAGNOSIS — Z515 Encounter for palliative care: Secondary | ICD-10-CM | POA: Diagnosis not present

## 2022-06-30 DIAGNOSIS — K8050 Calculus of bile duct without cholangitis or cholecystitis without obstruction: Secondary | ICD-10-CM | POA: Diagnosis not present

## 2022-06-30 DIAGNOSIS — K851 Biliary acute pancreatitis without necrosis or infection: Secondary | ICD-10-CM | POA: Diagnosis not present

## 2022-06-30 DIAGNOSIS — R6250 Unspecified lack of expected normal physiological development in childhood: Secondary | ICD-10-CM | POA: Diagnosis not present

## 2022-06-30 LAB — HEPATIC FUNCTION PANEL
ALT: 77 U/L — ABNORMAL HIGH (ref 0–44)
AST: 48 U/L — ABNORMAL HIGH (ref 15–41)
Albumin: 2.3 g/dL — ABNORMAL LOW (ref 3.5–5.0)
Alkaline Phosphatase: 688 U/L — ABNORMAL HIGH (ref 38–126)
Bilirubin, Direct: 3.9 mg/dL — ABNORMAL HIGH (ref 0.0–0.2)
Indirect Bilirubin: 3.3 mg/dL — ABNORMAL HIGH (ref 0.3–0.9)
Total Bilirubin: 7.2 mg/dL — ABNORMAL HIGH (ref 0.3–1.2)
Total Protein: 5 g/dL — ABNORMAL LOW (ref 6.5–8.1)

## 2022-06-30 LAB — CBC
HCT: 36.5 % (ref 36.0–46.0)
Hemoglobin: 11.9 g/dL — ABNORMAL LOW (ref 12.0–15.0)
MCH: 32 pg (ref 26.0–34.0)
MCHC: 32.6 g/dL (ref 30.0–36.0)
MCV: 98.1 fL (ref 80.0–100.0)
Platelets: 186 10*3/uL (ref 150–400)
RBC: 3.72 MIL/uL — ABNORMAL LOW (ref 3.87–5.11)
RDW: 14.2 % (ref 11.5–15.5)
WBC: 5.5 10*3/uL (ref 4.0–10.5)
nRBC: 0 % (ref 0.0–0.2)

## 2022-06-30 LAB — MAGNESIUM: Magnesium: 1.9 mg/dL (ref 1.7–2.4)

## 2022-06-30 LAB — GLUCOSE, CAPILLARY: Glucose-Capillary: 65 mg/dL — ABNORMAL LOW (ref 70–99)

## 2022-06-30 LAB — CYTOLOGY - NON PAP

## 2022-06-30 LAB — CANCER ANTIGEN 19-9: CA 19-9: 1722 U/mL — ABNORMAL HIGH (ref 0–35)

## 2022-06-30 MED ORDER — IOHEXOL 300 MG/ML  SOLN
100.0000 mL | Freq: Once | INTRAMUSCULAR | Status: AC | PRN
  Administered 2022-06-30: 100 mL via INTRAVENOUS

## 2022-06-30 MED ORDER — OXYCODONE HCL 5 MG PO TABS
5.0000 mg | ORAL_TABLET | Freq: Four times a day (QID) | ORAL | Status: DC | PRN
  Administered 2022-07-01 – 2022-07-02 (×3): 5 mg via ORAL
  Filled 2022-06-30 (×3): qty 1

## 2022-06-30 NOTE — Consult Note (Signed)
[place] at [facility] Telephone:([phone]) [phone] [phone]   Name: Roberta Hampton Date: 06/30/2022 MRN: [id number]  DOB: Feb 22, 1966  Patient Care Team: [facility], [place] as PCP - General ([place]) Cammie Sickle, MD as Consulting Physician (Oncology)    REASON FOR CONSULTATION: Roberta Hampton is a 56 y.o. female with multiple medical problems including seizure disorder, severe intellectual disability who resides in a group home.  Patient was admitted to the hospital on 06/23/2022 with jaundice with MRCP showing distended CBD with potential pancreatic head mass and multiple liver lesions.  Patient patient underwent biliary drain placement on 10/24.  CA 19-9 greater than 1700.  Palliative care was consulted to help address goals.  SOCIAL HISTORY:     reports that she has never smoked. She has never used smokeless tobacco. She reports that she does not drink alcohol and does not use drugs.  Patient lives in a group home.  Her mother is her legal guardian.  ADVANCE DIRECTIVES:  Does not have  CODE STATUS: DNR  PAST MEDICAL HISTORY: Past Medical History:  Diagnosis Date   Attention deficit disorder    Hyponatremia    associated with polydipsia   Leukopenia    Mental retardation    Obesity    Onychomycosis    Seizure disorder (HCC)    Seizures (HCC)    Thrombocytopenia (HCC)     PAST SURGICAL HISTORY:  Past Surgical History:  Procedure Laterality Date   ABDOMINAL HYSTERECTOMY     ENDOSCOPIC RETROGRADE CHOLANGIOPANCREATOGRAPHY (ERCP) WITH PROPOFOL N/A 06/24/2022   Procedure: ENDOSCOPIC RETROGRADE CHOLANGIOPANCREATOGRAPHY (ERCP) WITH PROPOFOL;  Surgeon: Lucilla Lame, MD;  Location: [facility] ENDOSCOPY;  Service: Endoscopy;  Laterality: N/A;   IR INT EXT BILIARY DRAIN WITH CHOLANGIOGRAM  06/28/2022   ORIF ANKLE FRACTURE Left 05/08/2015   Procedure: OPEN REDUCTION INTERNAL FIXATION (ORIF)  ANKLE FRACTURE;  Surgeon: Corky Mull, MD;  Location: [facility] ORS;  Service: Orthopedics;  Laterality: Left;   VAGINAL HYSTERECTOMY      HEMATOLOGY/ONCOLOGY HISTORY:  Oncology History   No history exists.    ALLERGIES:  has No Known Allergies.  MEDICATIONS:  Current Facility-Administered Medications  Medication Dose Route Frequency Provider Last Rate Last Admin   acetaminophen (TYLENOL) tablet 650 mg  650 mg Oral Q6H PRN Lucilla Lame, MD   650 mg at 06/28/22 2042   Or   acetaminophen (TYLENOL) suppository 650 mg  650 mg Rectal Q6H PRN Lucilla Lame, MD       calcium-vitamin D (OSCAL WITH D) 500-5 MG-MCG per tablet   Oral BID WC Lucilla Lame, MD   1 tablet at 06/30/22 1039   cyanocobalamin (VITAMIN B12) tablet 2,000 mcg  2,000 mcg Oral Daily Lucilla Lame, MD   2,000 mcg at 06/30/22 1039   diclofenac suppository 100 mg  100 mg Rectal Once Lucilla Lame, MD       enoxaparin (LOVENOX) injection 40 mg  40 mg Subcutaneous Q24H Lucilla Lame, MD   40 mg at 06/30/22 1039   feeding supplement (ENSURE ENLIVE / ENSURE PLUS) liquid   Oral BID BM Lucilla Lame, MD   237 mL at 72/90/21 1155   folic acid (FOLVITE) tablet 1 mg  1 mg Oral Daily Lucilla Lame, MD   1 mg at 06/30/22 1039   guaiFENesin (ROBITUSSIN) 100 MG/5ML liquid 5 mL  5 mL Oral Q4H PRN Lucilla Lame, MD       ipratropium-albuterol (DUONEB) 0.5-2.5 (3) MG/3ML  nebulizer solution 3 mL  3 mL Nebulization Q4H PRN Lucilla Lame, MD       magnesium hydroxide (MILK OF MAGNESIA) suspension 30 mL  30 mL Oral Daily PRN Lucilla Lame, MD   30 mL at 06/25/22 1704   mirabegron ER (MYRBETRIQ) tablet 25 mg  25 mg Oral Daily Lucilla Lame, MD   25 mg at 06/30/22 1038   multivitamin with minerals tablet   Oral Daily Lucilla Lame, MD   1 tablet at 06/30/22 1039   ondansetron (ZOFRAN) injection 4 mg  4 mg Intravenous Q6H PRN Lucilla Lame, MD       ondansetron (ZOFRAN) tablet 4 mg  4 mg Oral Q6H PRN Lucilla Lame, MD       Oral care mouth rinse  15 mL Mouth Rinse PRN  Rai, Ripudeep K, MD       polyethylene glycol (MIRALAX / GLYCOLAX) packet 17 g  17 g Oral Daily Lucilla Lame, MD   17 g at 06/30/22 1038   QUEtiapine (SEROQUEL) tablet 800 mg  800 mg Oral QHS Lucilla Lame, MD   800 mg at 06/29/22 2150   risperiDONE (RISPERDAL) tablet 2 mg  2 mg Oral BID Lucilla Lame, MD   2 mg at 06/30/22 1039   sodium chloride flush (NS) 0.9 % injection 5 mL  5 mL Intracatheter Q8H Mugweru, Jon, MD   5 mL at 06/30/22 0540   traZODone (DESYREL) tablet 25 mg  25 mg Oral QHS PRN Lucilla Lame, MD       valproic acid (DEPAKENE) 250 MG/5ML solution 500 mg  500 mg Oral Daily Lucilla Lame, MD   500 mg at 06/30/22 1038   And   valproic acid (DEPAKENE) 250 MG/5ML solution 375 mg  375 mg Oral QHS Lucilla Lame, MD   375 mg at 06/29/22 2150   vitamin E capsule 200 Units  200 Units Oral Daily Lucilla Lame, MD   200 Units at 06/30/22 1038    VITAL SIGNS: BP 124/74 (BP Location: Right Arm)   Pulse 79   Temp 97.8 F (36.6 C)   Resp 17   SpO2 99%  There were no vitals filed for this visit.  Estimated body mass index is 24.8 kg/m as calculated from the following:   Height as of 02/28/22: 5' (1.524 m).   Weight as of 06/24/22: 127 lb (57.6 kg).  LABS: CBC:    Component Value Date/Time   WBC 5.5 06/30/2022 0519   HGB 11.9 (L) 06/30/2022 0519   HGB 13.8 05/08/2019 0936   HCT 36.5 06/30/2022 0519   HCT 40.3 05/08/2019 0936   PLT 186 06/30/2022 0519   PLT 92 (LL) 05/08/2019 0936   MCV 98.1 06/30/2022 0519   MCV 95 05/08/2019 0936   MCV 94 11/13/2011 2306   NEUTROABS 2.5 06/27/2022 0309   NEUTROABS 1.0 (L) 05/08/2019 0936   LYMPHSABS 2.3 06/27/2022 0309   LYMPHSABS 1.5 05/08/2019 0936   MONOABS 1.2 (H) 06/27/2022 0309   EOSABS 0.0 06/27/2022 0309   EOSABS 0.0 05/08/2019 0936   BASOSABS 0.0 06/27/2022 0309   BASOSABS 0.0 05/08/2019 0936   Comprehensive Metabolic Panel:    Component Value Date/Time   NA 139 06/29/2022 0505   NA 140 05/08/2019 0936   NA 143 11/13/2011 2004    K 4.0 06/29/2022 0505   K 3.8 11/13/2011 2004   CL 106 06/29/2022 0505   CL 105 11/13/2011 2004   CO2 24 06/29/2022 0505   CO2 26 11/13/2011  2004   BUN 21 (H) 06/29/2022 0505   BUN 16 05/08/2019 0936   BUN 17 11/13/2011 2004   CREATININE 0.55 06/29/2022 0505   CREATININE 0.58 (L) 11/13/2011 2004   GLUCOSE 86 06/29/2022 0505   GLUCOSE 96 11/13/2011 2004   CALCIUM 8.7 (L) 06/29/2022 0505   CALCIUM 9.0 11/13/2011 2004   AST 48 (H) 06/30/2022 0519   AST 18 11/13/2011 2004   ALT 77 (H) 06/30/2022 0519   ALT 26 11/13/2011 2004   ALKPHOS 688 (H) 06/30/2022 0519   ALKPHOS 91 11/13/2011 2004   BILITOT 7.2 (H) 06/30/2022 0519   BILITOT 0.3 05/08/2019 0936   BILITOT 0.7 11/13/2011 2004   PROT 5.0 (L) 06/30/2022 0519   PROT 5.0 (L) 05/08/2019 0936   PROT 6.4 11/13/2011 2004   ALBUMIN 2.3 (L) 06/30/2022 0519   ALBUMIN 3.4 (L) 05/08/2019 0936   ALBUMIN 3.5 11/13/2011 2004    RADIOGRAPHIC STUDIES: IR INT EXT BILIARY DRAIN WITH CHOLANGIOGRAM  Result Date: 06/28/2022 INDICATION: Jaundice.  Question malignant biliary obstruction. EXAM: PERCUTANEOUS TRANSHEPATIC BILIARY TUBE PLACEMENT and ANTEROGRADE CHOLANGIOGRAM COMPARISON:  MR abdomen/MRCP, 06/24/2022. US Abdomen, 06/23/2022. PET-CT, 02/23/2022. MEDICATIONS: Cefoxitin, 2 g IV; The antibiotic was administered with an appropriate time frame prior to the initiation of the procedure CONTRAST:  41mL OMNIPAQUE IOHEXOL 300 MG/ML SOLN, 108mL OMNIPAQUE IOHEXOL 300 MG/ML SOLN - administered into the biliary tree. ANESTHESIA/SEDATION: Moderate (conscious) sedation was employed during this procedure. A total of Versed 2 mg and Fentanyl 100 mcg was administered intravenously. Moderate Sedation Time: 41 minutes. The patient's level of consciousness and vital signs were monitored continuously by radiology nursing throughout the procedure under my direct supervision. FLUOROSCOPY TIME:  Fluoroscopic dose; 71.2 mGy COMPLICATIONS: None immediate. TECHNIQUE:  Informed written consent was obtained from the patient and/or patient's representative after a discussion of the risks, benefits and alternatives to treatment. Questions regarding the procedure were encouraged and answered. A timeout was performed prior to the initiation of the procedure. The epigastrium and RIGHT upper abdominal quadrant was prepped and draped in the usual sterile fashion, and a sterile drape was applied covering the operative field. Maximum barrier sterile technique with sterile gowns and gloves were used for the procedure. A timeout was performed prior to the initiation of the procedure. Ultrasound scanning of the right upper abdominal quadrant was performed to delineate the anatomy and avoid transgression of the gallbladder or the pleural. A spot along the subcostal epigastrium was marked sonographically. After the overlying soft tissues were anesthetized with 1% Lidocaine with epinephrine, under direct ultrasound guidance, a 22 gauge Chiba needle was utilized to cannulate the peripheral aspect of a LEFT intrahepatic biliary duct. Appropriate position was confirmed with limited contrast injection. Next, the duct was cannulated with a Nitrex wire and dilated with an Accustick set under fluoroscopic guidance. Limited cholangiograms were performed in various obliquities confirming appropriate access. Next, the outer sheath of the Accustick set and with the use of a stiff Glidewire, advanced through the biliary hilum, common bile duct and ampulla to the level of the duodenum. Contrast injection confirmed appropriate positioning. Under intermittent fluoroscopic guidance and over an Amplatz wire, the track was dilated ultimately allowing placement of a 10 Fr biliary drainage catheter with coil ultimately locked within the duodenum. Contrast was injected and a completion radiographs were obtained in various obliquities. The catheter was connected to a drainage bag which yielded the brisk return of  clear bile. The catheter was secured to the skin with an interrupted suture  and StatLock device. Dressings were applied. The patient tolerated the procedure well without immediate postprocedural complication. FINDINGS: Sonographic evaluation of the liver demonstrates marked intrahepatic biliary ductal dilatation as was demonstrated on preceding cross-sectional imaging, including MRCP. Under direct ultrasound guidance, a dilated peripheral duct within the LEFT lobe of liver was accessed allowing placement of a 10 Fr biliary drainage catheter with end ultimately coiled and locked within the duodenum and radiopaque side marker located proximal to the level of the biliary hilum. Limited contrast injection demonstrates marked dilatation of the CBD and intrahepatic biliary tree with communication between the right and left biliary trees at the level of the hilum. IMPRESSION: 1. Cholangiogram demonstrating marked intrahepatic and extrahepatic biliary ductal dilatation, similar to findings on corresponding MRCP. 2. Successful placement of a 10 Fr percutaneous biliary drainage catheter via LEFT transhepatic approach, with marker located proximal to the level of the biliary hilum and biliary catheter tip within the duodenum, as described above. PLAN: Patient will return to [facility] ([facility]) for routine drainage catheter evaluation and exchange in 8 weeks. Michaelle Birks, MD [facility] Electronically Signed   By: Michaelle Birks M.D.   On: 06/28/2022 15:20   DG C-Arm 1-60 Min-No Report  Result Date: 06/24/2022 Fluoroscopy was utilized by the requesting physician.  No radiographic interpretation.   MR ABDOMEN MRCP WO CONTRAST  Result Date: 06/24/2022 CLINICAL DATA:  56 year old female with history of jaundice. EXAM: MRI ABDOMEN WITHOUT CONTRAST  (INCLUDING MRCP) TECHNIQUE: Multiplanar multisequence MR imaging of the abdomen was performed.  Heavily T2-weighted images of the biliary and pancreatic ducts were obtained, and three-dimensional MRCP images were rendered by post processing. COMPARISON:  No prior abdominal MRI. Abdominal ultrasound 06/23/2022. PET-CT 02/23/2022. CT of the chest, abdomen and pelvis 01/06/2022. FINDINGS: Comment: Today's study is severely limited for detection and characterization of visceral and/or vascular lesions by lack of IV gadolinium. Lower chest: Small bilateral pleural effusions lie dependently. Hepatobiliary: Multiple poorly defined hepatic lesions are noted which are low T1 signal and mildly high T2 signal intensity, incompletely evaluated on today's noncontrast examination, but new compared to prior PET-CT and CT examinations. The largest of these lesions measure up to 2.1 x 1.0 cm in the periphery of segment 5 (axial image 21 of series 4) and 1.9 x 1.3 cm in the central aspect of segment 7 (axial image 12 of series 4). There is a suggestion of some peripheral diffusion restriction for several of these lesions. Severe intra and extrahepatic biliary ductal dilatation noted, new compared to the prior examination, measuring up to 12 mm in the porta hepatis. Abrupt cut off of the distal common bile duct immediately before the level of the ampulla. No filling defect in the common bile duct to suggest choledocholithiasis. Gallbladder is severely distended. Gallbladder wall does not appear thickened. No pericholecystic fluid or surrounding inflammatory changes. Pancreas: In the head of the pancreas (axial image 55 of series 10) there is an area of potential diffusion restriction. This is poorly demonstrated on today's noncontrast images, and difficult to discretely measure, but concerning for potential pancreatic head mass. Associated with this there is dilatation of the main pancreatic duct which measures up to 6 mm in the body of the pancreas, along with diffuse side branch ectasia. No peripancreatic fluid collections or  inflammatory changes. Spleen: Multiple T1 and T2 hypointense spleen lesions, incompletely characterized on today's noncontrast examination, but measuring up to 2.3 cm (axial image 7 of series 4). Adrenals/Urinary Tract:  No suspicious renal lesions are noted on today's noncontrast examination. 8 mm T1 hypointense, T2 hyperintense lesion in the upper pole of the left kidney posteriorly, statistically likely a small cysts (no imaging follow-up recommended). Bilateral extrarenal pelvises. 2 filling defects are noted in the right renal pelvis on axial image 20 of series 4 and 22 of series 4, potentially calculi. Bilateral adrenal glands are normal in appearance. Stomach/Bowel: Visualized portions are unremarkable. Vascular/Lymphatic: No aneurysm identified in the visualized abdominal vasculature on today's noncontrast examination. Other: No significant volume of ascites noted in the visualized portions of the peritoneal cavity. Musculoskeletal: No aggressive appearing osseous lesions are noted in the visualized portions of the skeleton. IMPRESSION: 1. Severely limited examination secondary to lack of IV gadolinium. With these limitations in mind, there is severe intra and extrahepatic biliary ductal dilatation, along with diffuse pancreatic ductal dilatation, and there is a suggestion of a mass in the head of the pancreas based on diffusion weighted images concerning for malignancy. Further evaluation with repeat abdominal MRI with and without IV gadolinium with MRCP is recommended to better evaluate this finding. Alternatively, endoscopic ultrasound and tissue sampling could be considered if clinically appropriate. 2. Multiple indeterminate liver lesions, poorly characterized on today's noncontrast examination, but new compared to prior CT and PET-CT examinations. The possibility of metastatic disease in the liver should be considered. Other aggressive etiology such as multifocal hepatic abscesses could also be  considered in the appropriate clinical setting, however, the degree of diffusion restriction noted with these lesions is less than would be expected for abscesses. 3. Multiple indeterminate splenic lesions stable compared to prior CT examinations and not associated with diffusion restriction, likely benign lesions. 4. Small bilateral pleural effusions lying dependently. Electronically Signed   By: Vinnie Langton M.D.   On: 06/24/2022 06:19   MR 3D Recon At Scanner  Result Date: 06/24/2022 CLINICAL DATA:  56 year old female with history of jaundice. EXAM: MRI ABDOMEN WITHOUT CONTRAST  (INCLUDING MRCP) TECHNIQUE: Multiplanar multisequence MR imaging of the abdomen was performed. Heavily T2-weighted images of the biliary and pancreatic ducts were obtained, and three-dimensional MRCP images were rendered by post processing. COMPARISON:  No prior abdominal MRI. Abdominal ultrasound 06/23/2022. PET-CT 02/23/2022. CT of the chest, abdomen and pelvis 01/06/2022. FINDINGS: Comment: Today's study is severely limited for detection and characterization of visceral and/or vascular lesions by lack of IV gadolinium. Lower chest: Small bilateral pleural effusions lie dependently. Hepatobiliary: Multiple poorly defined hepatic lesions are noted which are low T1 signal and mildly high T2 signal intensity, incompletely evaluated on today's noncontrast examination, but new compared to prior PET-CT and CT examinations. The largest of these lesions measure up to 2.1 x 1.0 cm in the periphery of segment 5 (axial image 21 of series 4) and 1.9 x 1.3 cm in the central aspect of segment 7 (axial image 12 of series 4). There is a suggestion of some peripheral diffusion restriction for several of these lesions. Severe intra and extrahepatic biliary ductal dilatation noted, new compared to the prior examination, measuring up to 12 mm in the porta hepatis. Abrupt cut off of the distal common bile duct immediately before the level of the  ampulla. No filling defect in the common bile duct to suggest choledocholithiasis. Gallbladder is severely distended. Gallbladder wall does not appear thickened. No pericholecystic fluid or surrounding inflammatory changes. Pancreas: In the head of the pancreas (axial image 55 of series 10) there is an area of potential diffusion restriction. This is poorly demonstrated on  today's noncontrast images, and difficult to discretely measure, but concerning for potential pancreatic head mass. Associated with this there is dilatation of the main pancreatic duct which measures up to 6 mm in the body of the pancreas, along with diffuse side branch ectasia. No peripancreatic fluid collections or inflammatory changes. Spleen: Multiple T1 and T2 hypointense spleen lesions, incompletely characterized on today's noncontrast examination, but measuring up to 2.3 cm (axial image 7 of series 4). Adrenals/Urinary Tract: No suspicious renal lesions are noted on today's noncontrast examination. 8 mm T1 hypointense, T2 hyperintense lesion in the upper pole of the left kidney posteriorly, statistically likely a small cysts (no imaging follow-up recommended). Bilateral extrarenal pelvises. 2 filling defects are noted in the right renal pelvis on axial image 20 of series 4 and 22 of series 4, potentially calculi. Bilateral adrenal glands are normal in appearance. Stomach/Bowel: Visualized portions are unremarkable. Vascular/Lymphatic: No aneurysm identified in the visualized abdominal vasculature on today's noncontrast examination. Other: No significant volume of ascites noted in the visualized portions of the peritoneal cavity. Musculoskeletal: No aggressive appearing osseous lesions are noted in the visualized portions of the skeleton. IMPRESSION: 1. Severely limited examination secondary to lack of IV gadolinium. With these limitations in mind, there is severe intra and extrahepatic biliary ductal dilatation, along with diffuse  pancreatic ductal dilatation, and there is a suggestion of a mass in the head of the pancreas based on diffusion weighted images concerning for malignancy. Further evaluation with repeat abdominal MRI with and without IV gadolinium with MRCP is recommended to better evaluate this finding. Alternatively, endoscopic ultrasound and tissue sampling could be considered if clinically appropriate. 2. Multiple indeterminate liver lesions, poorly characterized on today's noncontrast examination, but new compared to prior CT and PET-CT examinations. The possibility of metastatic disease in the liver should be considered. Other aggressive etiology such as multifocal hepatic abscesses could also be considered in the appropriate clinical setting, however, the degree of diffusion restriction noted with these lesions is less than would be expected for abscesses. 3. Multiple indeterminate splenic lesions stable compared to prior CT examinations and not associated with diffusion restriction, likely benign lesions. 4. Small bilateral pleural effusions lying dependently. Electronically Signed   By: Vinnie Langton M.D.   On: 06/24/2022 06:19   US ABDOMEN LIMITED RUQ (LIVER/GB)  Result Date: 06/23/2022 CLINICAL DATA:  Elevated LFTs EXAM: ULTRASOUND ABDOMEN LIMITED RIGHT UPPER QUADRANT COMPARISON:  CT 01/06/2022 FINDINGS: Gallbladder: Distended gallbladder. No gallstones or wall thickening visualized. No sonographic Murphy sign noted by sonographer. Common bile duct: Diameter: 9 mm Liver: No focal lesion identified. Within normal limits in parenchymal echogenicity. Portal vein is patent on color Doppler imaging with normal direction of blood flow towards the liver. Other: None. IMPRESSION: Distended gallbladder with dilation of the common bile duct measuring 9 mm. This is grossly similar to PET/CT 02/23/2022 given differences in technique. Consider MRI/MRCP for further evaluation if there is concern for obstructing stone or mass.  No evidence of cholecystitis. Electronically Signed   By: Placido Sou M.D.   On: 06/23/2022 21:25    PERFORMANCE STATUS (ECOG) : 3 - Symptomatic, >50% confined to bed  Review of Systems Unable to complete  Physical Exam General: Thin, frail appearing, edentulous Pulmonary: On the Abdomen: Biliary drain  GU: no suprapubic tenderness Extremities: no edema, no joint deformities Skin: no rashes Neurological: Weakness but otherwise nonfocal  IMPRESSION: Patient unable to engage meaningfully in a conversation regarding goals due to her severe intellectual disability.  I  met with patient's mother who is her primary decision-maker.  Patient's mother says she recognizes that work-up appears consistent with an advanced cancer.  Patient has been seen by medical oncology and likely would be a poor candidate for systemic treatment.  Patient's mother says that she does not think the patient could tolerate chemotherapy and I agree with this assessment.  She was okay with foregoing biopsy.  Instead, family would prefer to focus on patient's comfort until end-of-life.  Patient's mother is in agreement with hospice involvement and I will consult the hospice liaison to coordinate.  Mother states clearly that she would not want patient to be resuscitated or have her life prolonged artificially on machines.  She was in agreement DNR/DNI.  Patient did seem in pain while I was in the room.  Her mother confirms that patient has been hurting today.  Will add oxycodone as needed for pain.  However, patient's mother says that in the past opioids cause significant drowsiness.  This effect could be exacerbated in the setting of hepatic dysfunction.  As patient has difficulty articulating pain and given possible poor tolerance of pain medications, I wonder if she would be a candidate for a celiac plexus block prior to enrollment in hospice. Will speak with IR.   PLAN: -Best supportive care -Oxycodone 5 mg every 6  hours as needed for pain -Consider celiac plexus block -Referral for hospice -DNR  Case and plan discussed with Dr. Grayland Ormond   Time Total: 60 minutes  Visit consisted of counseling and education dealing with the complex and emotionally intense issues of symptom management and palliative care in the setting of serious and potentially life-threatening illness.Greater than 50%  of this time was spent counseling and coordinating care related to the above assessment and plan.  Signed by: Altha Harm, PhD, NP-C

## 2022-06-30 NOTE — Progress Notes (Signed)
Will continue to assess for TOC needs.

## 2022-06-30 NOTE — Progress Notes (Signed)
[facility]) Hospital Liaison Note:   Received referral for home with hospice services.  Met at the patient  bedside with mother.  Hospice philosophy and services discussed and mother is agreeable to services at discharge.    No DME needs identified at this time. Patient has a walker at the bedside.    Please send signed and completed DNR and any prescription medications for symptom management at the time of discharge.    Please call with any questions or concerns.  Thank you  Kenna Gilbert BSN, RN  [facility] Liaison  [phone]

## 2022-06-30 NOTE — Care Management Important Message (Signed)
Important Message  Patient Details  Name: Roberta Hampton MRN: [id number] Date of Birth: November 20, 1965   Medicare Important Message Given:  Yes     Dannette Barbara 06/30/2022, 2:55 PM

## 2022-06-30 NOTE — TOC Progression Note (Addendum)
Transition of Care [facility]) - Progression Note    Patient Details  Name: Roberta Hampton MRN: [id number] Date of Birth: February 28, 1966  Transition of Care [facility]) CM/SW Contact  Laurena Slimmer, RN Phone Number: 06/30/2022, 2:06 PM  Clinical Narrative:    Spoke with patient's mother Kermit Balo regarding discharge plan. Patient mother would like for patient to discharge home with [facility]. Referral made to [facility] and Kenna Gilbert from [place].          Expected Discharge Plan and Services                                                 Social Determinants of Health (SDOH) Interventions    Readmission Risk Interventions     No data to display

## 2022-06-30 NOTE — Progress Notes (Signed)
[facility]            Roberta Hampton, is a 56 y.o. female, DOB - 06-02-1966, DTO:[id number] Admit date - 06/23/2022    Outpatient Primary MD for the [facility] is [facility] [place]  LOS - 7  days  Brief summary   56 year old with history of seizure disorder, ADD, developmental delay comes to the ED for acute onset of jaundice and abnormal lab at PCP office.  Patient also had dark tea colored urine during this time.  Recently was diagnosed with UTI on 10/11 treated with Macrobid.  Upon admission had transaminitis, right upper quadrant ultrasound showed distended gallbladder with dilated CBD 9 mm, no evidence of cholelithiasis.  MRCP shows potential pancreatic head mass with multiple liver lesion.  GI consulted. 10/20: ERCP 10/24: biliary drainage placed 10/26: improvement in liver labs, RUQ [place]. Plan to proceed with lesion biopsy, consulted IR. 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 Plan to obtain liver Bx 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[place]) - Stable, continue Depakote  Code Status: Full code DVT Prophylaxis:  enoxaparin (LOVENOX) injection 40 mg Start: 06/24/22 0800  Level of Care: Level of care: Med-Surg Family Communication: updated  patient's mother at bedside and sister in law, [place], on the phone Disposition Plan:      Remains inpatient appropriate: pending further workup/improvement  Procedures:  ERCP 10/20 Biliary drain placed by IR 10/24  Consultants:   GI IR Oncology   Antimicrobials:   Anti-infectives (From admission, onward)    Start     Dose/Rate Route Frequency Ordered Stop   06/28/22 1300  cefOXitin (MEFOXIN) 2 g in sodium chloride 0.9 % 100 mL IVPB        2 g 200 mL/hr over 30 Minutes Intravenous On call 06/28/22 1201 06/29/22 1300   06/28/22 1219  cefOXitin (MEFOXIN) 2 g in sodium chloride 0.9 % 100 mL IVPB        over 30 Minutes  Continuous PRN 06/28/22 1219 06/28/22 1219   06/27/22 0800  cefOXitin (MEFOXIN) 2 g in sodium chloride 0.9 % 100 mL IVPB       Note to Pharmacy: To radiology for procedure   2 g 200 mL/hr over 30 Minutes Intravenous On call 06/24/22 1717 06/28/22 0800       Medications  calcium-vitamin D   Oral BID WC   cyanocobalamin  2,000 mcg Oral Daily   diclofenac  100 mg Rectal Once   enoxaparin (LOVENOX) injection  40 mg Subcutaneous Q24H   feeding supplement   Oral BID BM   folic acid  1 mg Oral Daily   mirabegron ER  25 mg Oral Daily   multivitamin with minerals   Oral Daily   polyethylene glycol  17 g Oral Daily   QUEtiapine  800 mg Oral QHS   risperidone  2 mg Oral BID  sodium chloride flush  5 mL Intracatheter Q8H   valproic acid  500 mg Oral Daily   And   valproic acid  375 mg Oral QHS   vitamin E  200 Units Oral Daily    Subjective:   Roberta Hampton is speaking mostly incoherently but responsive to staff. Uncooperative with BP monitoring. Asking for her "mama".  Objective:   Vitals:   06/29/22 0740 06/29/22 1509 06/29/22 1941 06/30/22 0001  BP: 109/67 (!) 144/92 120/70 124/74  Pulse: 80 95 74 81  Resp: '18 18 20 20  '$ Temp: 98.8 F (37.1 C) 98 F (36.7 C) 97.7 F (36.5 C) 98.7 F (37.1 C)  TempSrc:      SpO2: 98% 100% 100% 100%     Intake/Output Summary (Last 24 hours) at 06/30/2022 2440 Last data filed at 06/30/2022 0200 Gross per 24 hour  Intake 625 ml  Output 340 ml  Net 285 ml    Physical Exam General: NAD, comfortable Cardiovascular: quick capillary refill  Respiratory: CTAB, no wheezing Gastrointestinal: Soft, nontender, nondistended, NBS. Drain in place Ext: no pedal edema bilaterally Psych: appears close to her baseline, per mom  Data Reviewed:  I have personally reviewed following labs   CBC Lab Results  Component Value Date   WBC 5.5 06/30/2022   RBC 3.72 (L) 06/30/2022   HGB 11.9 (L) 06/30/2022   HCT 36.5 06/30/2022   MCV 98.1 06/30/2022   MCH 32.0 06/30/2022   PLT 186 06/30/2022   MCHC 32.6 06/30/2022   RDW 14.2 06/30/2022   LYMPHSABS 2.3 06/27/2022   MONOABS 1.2 (H) 06/27/2022   EOSABS 0.0 06/27/2022   BASOSABS 0.0 07/02/2535     Last metabolic panel Lab Results  Component Value Date   NA 139 06/29/2022   K 4.0 06/29/2022   CL 106 06/29/2022   CO2 24 06/29/2022   BUN 21 (H) 06/29/2022   CREATININE 0.55 06/29/2022   GLUCOSE 86 06/29/2022   GFRNONAA >60 06/29/2022   GFRAA 114 05/08/2019   CALCIUM 8.7 (L) 06/29/2022   PROT 5.0 (L) 06/30/2022   ALBUMIN 2.3 (L) 06/30/2022   LABGLOB 2.2 02/04/2022   AGRATIO 2.1 05/08/2019   BILITOT 7.2 (H) 06/30/2022   ALKPHOS 688 (H) 06/30/2022   AST 48 (H) 06/30/2022   ALT 77 (H) 06/30/2022   ANIONGAP 9 06/29/2022    CBG (last 3)  Recent Labs    06/28/22 0558 06/29/22 0008 06/29/22 0616  GLUCAP 84 93 81     Coagulation Profile: Recent Labs  Lab 06/25/22 0452 06/27/22 0309  INR 1.6* 2.2*    Radiology Studies: I have personally reviewed the imaging studies  IR INT EXT BILIARY DRAIN WITH CHOLANGIOGRAM  Result Date: 06/28/2022 INDICATION: Jaundice.  Question malignant biliary obstruction. EXAM: PERCUTANEOUS TRANSHEPATIC BILIARY TUBE PLACEMENT and ANTEROGRADE CHOLANGIOGRAM COMPARISON:  MR abdomen/MRCP, 06/24/2022. US  Abdomen, 06/23/2022. PET-CT, 02/23/2022. MEDICATIONS: Cefoxitin, 2 g IV; The antibiotic was administered with an appropriate time frame prior to the initiation of the procedure CONTRAST:  40m OMNIPAQUE IOHEXOL 300 MG/ML SOLN, 134mOMNIPAQUE IOHEXOL 300 MG/ML SOLN - administered into the biliary tree. ANESTHESIA/SEDATION: Moderate (conscious) sedation was employed during this procedure. A total of Versed 2 mg and Fentanyl 100 mcg was administered intravenously. Moderate Sedation Time: 41 minutes. The patient's level of consciousness and vital signs were monitored continuously by radiology nursing throughout the procedure under my direct supervision. FLUOROSCOPY TIME:  Fluoroscopic dose; 8564.4Gy COMPLICATIONS: None immediate. TECHNIQUE: Informed written consent was obtained from the  patient and/or patient's representative after a discussion of the risks, benefits and alternatives to treatment. Questions regarding the procedure were encouraged and answered. A timeout was performed prior to the initiation of the procedure. The epigastrium and RIGHT upper abdominal quadrant was prepped and draped in the usual sterile fashion, and a sterile drape was applied covering the operative field. Maximum barrier sterile technique with sterile gowns and gloves were used for the procedure. A timeout was performed prior to the initiation of the procedure. Ultrasound scanning of the right upper abdominal quadrant was performed to delineate the anatomy and avoid transgression of the gallbladder or the pleural. A spot along the subcostal epigastrium was marked sonographically. After the overlying soft tissues were anesthetized with 1% Lidocaine with epinephrine, under direct ultrasound guidance, a 22 gauge Chiba needle was utilized to cannulate the peripheral aspect of a LEFT intrahepatic biliary duct. Appropriate position was confirmed with limited contrast injection. Next, the duct was cannulated with a Nitrex wire and dilated with  an Accustick set under fluoroscopic guidance. Limited cholangiograms were performed in various obliquities confirming appropriate access. Next, the outer sheath of the Accustick set and with the use of a stiff Glidewire, advanced through the biliary hilum, common bile duct and ampulla to the level of the duodenum. Contrast injection confirmed appropriate positioning. Under intermittent fluoroscopic guidance and over an Amplatz wire, the track was dilated ultimately allowing placement of a 10 Fr biliary drainage catheter with coil ultimately locked within the duodenum. Contrast was injected and a completion radiographs were obtained in various obliquities. The catheter was connected to a drainage bag which yielded the brisk return of clear bile. The catheter was secured to the skin with an interrupted suture and StatLock device. Dressings were applied. The patient tolerated the procedure well without immediate postprocedural complication. FINDINGS: Sonographic evaluation of the liver demonstrates marked intrahepatic biliary ductal dilatation as was demonstrated on preceding cross-sectional imaging, including MRCP. Under direct ultrasound guidance, a dilated peripheral duct within the LEFT lobe of liver was accessed allowing placement of a 10 Fr biliary drainage catheter with end ultimately coiled and locked within the duodenum and radiopaque side marker located proximal to the level of the biliary hilum. Limited contrast injection demonstrates marked dilatation of the CBD and intrahepatic biliary tree with communication between the right and left biliary trees at the level of the hilum. IMPRESSION: 1. Cholangiogram demonstrating marked intrahepatic and extrahepatic biliary ductal dilatation, similar to findings on corresponding MRCP. 2. Successful placement of a 10 Fr percutaneous biliary drainage catheter via LEFT transhepatic approach, with marker located proximal to the level of the biliary hilum and biliary  catheter tip within the duodenum, as described above. PLAN: Patient will return to Vascular Interventional Radiology (VIR) for routine drainage catheter evaluation and exchange in 8 weeks. Michaelle Birks, MD [facility] Radiology Electronically Signed   By: Michaelle Birks M.D.   On: 06/28/2022 15:20     Richarda Osmond M.D. [facility] 06/30/2022, 7:27 AM  Available via Epic secure chat 7am-7pm After 7 pm, please refer to night coverage provider listed on amion.

## 2022-06-30 NOTE — Plan of Care (Signed)
  Problem: Clinical Measurements: Goal: Ability to maintain clinical measurements within normal limits will improve Outcome: Progressing   Problem: Activity: Goal: Risk for activity intolerance will decrease Outcome: Progressing   Problem: Elimination: Goal: Will not experience complications related to bowel motility Outcome: Progressing   Problem: Skin Integrity: Goal: Risk for impaired skin integrity will decrease Outcome: Progressing   Problem: Safety: Goal: Ability to remain free from injury will improve Outcome: Progressing

## 2022-07-01 DIAGNOSIS — G40319 Generalized idiopathic epilepsy and epileptic syndromes, intractable, without status epilepticus: Secondary | ICD-10-CM | POA: Diagnosis not present

## 2022-07-01 DIAGNOSIS — K851 Biliary acute pancreatitis without necrosis or infection: Secondary | ICD-10-CM | POA: Diagnosis not present

## 2022-07-01 DIAGNOSIS — K8050 Calculus of bile duct without cholangitis or cholecystitis without obstruction: Secondary | ICD-10-CM | POA: Diagnosis not present

## 2022-07-01 DIAGNOSIS — R6250 Unspecified lack of expected normal physiological development in childhood: Secondary | ICD-10-CM | POA: Diagnosis not present

## 2022-07-01 LAB — HEPATIC FUNCTION PANEL
ALT: 49 U/L — ABNORMAL HIGH (ref 0–44)
AST: 30 U/L (ref 15–41)
Albumin: 2.2 g/dL — ABNORMAL LOW (ref 3.5–5.0)
Alkaline Phosphatase: 501 U/L — ABNORMAL HIGH (ref 38–126)
Bilirubin, Direct: 4 mg/dL — ABNORMAL HIGH (ref 0.0–0.2)
Indirect Bilirubin: 3.1 mg/dL — ABNORMAL HIGH (ref 0.3–0.9)
Total Bilirubin: 7.1 mg/dL — ABNORMAL HIGH (ref 0.3–1.2)
Total Protein: 4.8 g/dL — ABNORMAL LOW (ref 6.5–8.1)

## 2022-07-01 LAB — CBC
HCT: 35.7 % — ABNORMAL LOW (ref 36.0–46.0)
Hemoglobin: 11.5 g/dL — ABNORMAL LOW (ref 12.0–15.0)
MCH: 31.3 pg (ref 26.0–34.0)
MCHC: 32.2 g/dL (ref 30.0–36.0)
MCV: 97.3 fL (ref 80.0–100.0)
Platelets: 181 10*3/uL (ref 150–400)
RBC: 3.67 MIL/uL — ABNORMAL LOW (ref 3.87–5.11)
RDW: 13.8 % (ref 11.5–15.5)
WBC: 6.6 10*3/uL (ref 4.0–10.5)
nRBC: 0 % (ref 0.0–0.2)

## 2022-07-01 MED ORDER — HYDROMORPHONE HCL 1 MG/ML IJ SOLN: 0.5000 mg | INTRAMUSCULAR | Status: DC | PRN

## 2022-07-01 NOTE — Progress Notes (Signed)
[facility]            Roberta Hampton, is a 56 y.o. female, DOB - 1966-06-17, KZL:[id number] Admit date - 06/23/2022    Outpatient Primary MD for the [facility] is [facility] [place]  LOS - 8  days  Brief summary   56 year old with history of seizure disorder, ADD, developmental delay comes to the ED for acute onset of jaundice and abnormal lab at PCP office.  Patient also had dark tea colored urine during this time.  Recently was diagnosed with UTI on 10/11 treated with Macrobid.  Upon admission had transaminitis, right upper quadrant ultrasound showed distended gallbladder with dilated CBD 9 mm, no evidence of cholelithiasis.  MRCP shows potential pancreatic head mass with multiple liver lesion.  GI consulted. 10/20: ERCP 10/24: biliary drainage placed 10/26: improvement in liver labs, RUQ [place]. Plan to proceed with lesion biopsy, consulted IR. Oncology following 10/27: family has decided to forgo the bx as they will not pursue treatment and instead would like to pursue home hospice.  Assessment & Plan   GOC- with continued poor prognosis despite receiving full care for underlying cause, family has decided to transition to comfort care at this time. Invasive and life-prolonging interventions that can be painful or anxiety provoking will be discontinued at this time.  - further recommendations by palliative.   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 Bx forgone for GOC reasons and lesions not amenable to BX, per IR.  -Oncology  consulted 10/24, Dr Grayland Ormond - following liver labs - diet as tolerated - daily weights - monitor drain   Depression, Developmental delay- Stable -  continue home meds: Luvox, Seroquel and Risperdal   Generalized convulsive epilepsy with intractable epilepsy ([place]) - Stable, continue Depakote  Code Status: comfort care, DNR DVT Prophylaxis:  enoxaparin (LOVENOX) injection 40 mg Start: 06/24/22 0800  Level of Care: Level of care: Med-Surg Family Communication: updated patient's mother at bedside and sister in law, [place], on the phone Disposition Plan:      Remains inpatient appropriate: pending discharge for hospice  Procedures:  ERCP 10/20 Biliary drain placed by IR 10/24  Consultants:   GI IR Oncology   Antimicrobials:   Anti-infectives (From admission, onward)    Start     Dose/Rate Route Frequency Ordered Stop   06/28/22 1300  cefOXitin (MEFOXIN) 2 g in sodium chloride 0.9 % 100 mL IVPB        2 g 200 mL/hr over 30 Minutes Intravenous On call 06/28/22 1201 06/29/22 1300   06/28/22 1219  cefOXitin (MEFOXIN) 2 g in sodium chloride 0.9 % 100 mL IVPB        over 30 Minutes  Continuous PRN 06/28/22 1219 06/28/22 1219   06/27/22 0800  cefOXitin (MEFOXIN) 2 g in sodium chloride 0.9 % 100 mL IVPB       Note to Pharmacy: To radiology for procedure   2 g 200 mL/hr over 30 Minutes Intravenous On call 06/24/22 1717 06/28/22 0800       Medications  calcium-vitamin D   Oral BID WC   cyanocobalamin  2,000 mcg Oral  Daily   diclofenac  100 mg Rectal Once   enoxaparin (LOVENOX) injection  40 mg Subcutaneous Q24H   feeding supplement   Oral BID BM   folic acid  1 mg Oral Daily   mirabegron ER  25 mg Oral Daily   multivitamin with minerals   Oral Daily   polyethylene glycol  17 g Oral Daily   QUEtiapine  800 mg Oral QHS   risperidone  2 mg Oral BID   sodium chloride flush  5 mL Intracatheter Q8H   valproic acid  500 mg Oral Daily   And   valproic acid  375 mg Oral QHS    vitamin E  200 Units Oral Daily    Subjective:   Roberta Hampton is awake and pleasant. She winces in pain and says it's from her stomach several times.   Objective:   Vitals:   06/30/22 0834 06/30/22 1608 06/30/22 2313 07/01/22 0800  BP:  1'32/74 92/68 97/61 '$  Pulse: 79 92 89 84  Resp: '17 17 20 16  '$ Temp: 97.8 F (36.6 C) 97.6 F (36.4 C) 98.9 F (37.2 C) 98.3 F (36.8 C)  TempSrc:      SpO2: 99% 100% 97% 97%    Intake/Output Summary (Last 24 hours) at 07/01/2022 1106 Last data filed at 07/01/2022 0430 Gross per 24 hour  Intake --  Output 770 ml  Net -770 ml    Physical Exam General: NAD, comfortable with occasional bouts of clear discomfort from her abdomen Cardiovascular: quick capillary refill  Respiratory: CTAB, no wheezing Gastrointestinal: Soft, nontender, nondistended, NBS. Drain in place Ext: no pedal edema bilaterally Psych: appears close to her baseline, per mom  Data Reviewed:  I have personally reviewed following labs   CBC Lab Results  Component Value Date   WBC 6.6 07/01/2022   RBC 3.67 (L) 07/01/2022   HGB 11.5 (L) 07/01/2022   HCT 35.7 (L) 07/01/2022   MCV 97.3 07/01/2022   MCH 31.3 07/01/2022   PLT 181 07/01/2022   MCHC 32.2 07/01/2022   RDW 13.8 07/01/2022   LYMPHSABS 2.3 06/27/2022   MONOABS 1.2 (H) 06/27/2022   EOSABS 0.0 06/27/2022   BASOSABS 0.0 43/15/4008     Last metabolic panel Lab Results  Component Value Date   NA 139 06/29/2022   K 4.0 06/29/2022   CL 106 06/29/2022   CO2 24 06/29/2022   BUN 21 (H) 06/29/2022   CREATININE 0.55 06/29/2022   GLUCOSE 86 06/29/2022   GFRNONAA >60 06/29/2022   GFRAA 114 05/08/2019   CALCIUM 8.7 (L) 06/29/2022   PROT 4.8 (L) 07/01/2022   ALBUMIN 2.2 (L) 07/01/2022   LABGLOB 2.2 02/04/2022   AGRATIO 2.1 05/08/2019   BILITOT 7.1 (H) 07/01/2022   ALKPHOS 501 (H) 07/01/2022   AST 30 07/01/2022   ALT 49 (H) 07/01/2022   ANIONGAP 9 06/29/2022    CBG (last 3)  Recent Labs     06/29/22 0008 06/29/22 0616 06/30/22 0823  GLUCAP 93 81 65*     Coagulation Profile: Recent Labs  Lab 06/25/22 0452 06/27/22 0309  INR 1.6* 2.2*    Radiology Studies: I have personally reviewed the imaging studies  CT ABD PELVIS W/WO CM ONCOLOGY PANCREATIC PROTOCOL  Result Date: 06/30/2022 CLINICAL DATA:  Multiple liver lesions. EXAM: CT ABDOMEN AND PELVIS WITHOUT AND WITH CONTRAST TECHNIQUE: Multidetector CT imaging of the abdomen and pelvis was performed following the standard protocol before and following the bolus administration of intravenous contrast. RADIATION DOSE REDUCTION:  This exam was performed according to the departmental dose-optimization program which includes automated exposure control, adjustment of the mA and/or kV according to patient size and/or use of iterative reconstruction technique. CONTRAST:  143m OMNIPAQUE IOHEXOL 300 MG/ML  SOLN COMPARISON:  MRCP 06/24/2022.  Abdomen and pelvis CT 01/06/2022. FINDINGS: Lower chest: Dependent atelectasis, left greater than right. Hepatobiliary: Percutaneous biliary drain evident. Pneumobilia compatible with drain placement. There are multiple ill-defined hypoattenuating liver lesions, new since 01/06/2022 and suspicious for metastatic disease. Index lesion medial segment left liver measures 1.7 cm on image 33/9. Index lesion inferior right liver measures 2.0 cm on 52/9. Contrast material in the gallbladder lumen likely residual a of cholangiogram 2 days ago. Gallbladder is ill-defined raising the question of gallbladder wall edema. Mild extrahepatic biliary duct prominence noted. Pancreas: Pancreatic head appears prominent and ill-defined on precontrast imaging. After IV contrast administration heterogeneous enhancement noted in the uncinate process with 2.6 cm hypoattenuating lesion evident, new since 01/06/2022. Diffuse dilatation of the main pancreatic duct noted with overlying parenchymal atrophy. Spleen: Multiple hypoattenuating  splenic lesions evident, similar to prior. Adrenals/Urinary Tract: No adrenal nodule or mass. Kidneys unremarkable. No evidence for hydroureter. Bladder is decompressed. Stomach/Bowel: Stomach is unremarkable. No gastric wall thickening. No evidence of outlet obstruction. Duodenum is normally positioned as is the ligament of Treitz. No small bowel wall thickening. No small bowel dilatation. The terminal ileum is normal. The appendix is not well visualized, but there is no edema or inflammation in the region of the cecum. No gross colonic mass. No colonic wall thickening. Vascular/Lymphatic: There is mild atherosclerotic calcification of the abdominal aorta without aneurysm. There is no gastrohepatic or hepatoduodenal ligament lymphadenopathy. No retroperitoneal or mesenteric lymphadenopathy. No pelvic sidewall lymphadenopathy. Reproductive: Uterus surgically absent.  There is no adnexal mass. Other: Small volume free fluid seen in the pelvis and adjacent to the liver. Musculoskeletal: Bone mineralization is diffusely heterogeneous without a discrete worrisome lytic or sclerotic osseous abnormality evident. IMPRESSION: 1. Multiple ill-defined hypoattenuating liver lesions, new since 01/06/2022 and suspicious for metastatic disease. 2. 2.6 cm hypoattenuating lesion in the uncinate process of the pancreas with diffuse dilatation of the main pancreatic duct and overlying parenchymal atrophy. Pancreatic neoplasm a concern. Endoscopic ultrasound may prove helpful to further evaluate. 3. Percutaneous biliary drain evident with pneumobilia compatible with drain placement. 4. Small volume free fluid in the pelvis and adjacent to the liver. 5. Multiple hypoattenuating splenic lesions, similar to prior. These are new since CT of 11/20/2017. While potentially benign, metastatic disease not excluded. 6. Dependent atelectasis, left greater than right. 7.  Aortic Atherosclerosis (ICD10-I70.0). Electronically Signed   By: EMisty StanleyM.D.   On: 06/30/2022 15:36   [place]Abdomen Limited RUQ (LIVER/GB)  Result Date: 06/30/2022 CLINICAL DATA:  Evaluate possible metastatic disease to the liver EXAM: ULTRASOUND ABDOMEN LIMITED RIGHT UPPER QUADRANT COMPARISON:  None Available. FINDINGS: Gallbladder: No gallstones or wall thickening visualized. No sonographic Murphy sign noted by sonographer. Common bile duct: Diameter: Not well visualized Liver: No focal lesion identified. Within normal limits in parenchymal echogenicity. Portal vein is patent on color Doppler imaging with normal direction of blood flow towards the liver. Other: None. IMPRESSION: No focal lesions identified with liver by ultrasound. No findings amenable for ultrasound-guided biopsy. Electronically Signed   By: YAlbin FellingM.D.   On: 06/30/2022 13:50     CRicharda OsmondM.D. [facility] 07/01/2022, 11:06 AM  Available via Epic secure chat 7am-7pm After 7 pm, please refer to  night coverage provider listed on amion.

## 2022-07-01 NOTE — Progress Notes (Addendum)
[facility]) Hospital Liaison Note:   Hospital Liaisons will continue to follow for disposition needs.  Plan is for patient to discharge home with mother, no DME needs identified.  Please call with any questions or concerns.  Thank you  Kenna Gilbert BSN, RN  [facility] Liaison  [phone]

## 2022-07-01 NOTE — Plan of Care (Signed)
  Problem: Education: Goal: Knowledge of General Education information will improve Description Including pain rating scale, medication(s)/side effects and non-pharmacologic comfort measures Outcome: Progressing   

## 2022-07-01 NOTE — Discharge Summary (Signed)
Physician Discharge Summary  Patient: Roberta Hampton OZD:[id number] DOB: 04/05/1966   Code Status: Prior Admit date: 06/23/2022 Discharge date: 07/04/2022 Disposition: Hospice care, home PCP: [facility]  Recommendations for Outpatient Follow-up:  Follow up with oncology for palliative treatment options.  Follow up with hospice same day appointment as dc Follow up with IR  for drain exchange 8 weeks after placement (around December 20th) To evaluate for celiac plexus block and pain control   Discharge Diagnoses:  Active Problems:   Obstructive jaundice   Generalized convulsive epilepsy with intractable epilepsy ([place])   Developmental delay   Depression   Acute biliary pancreatitis   Other diseases of stomach and duodenum   Palliative care encounter  Brief Hospital Course Summary: 56 year old with history of seizure disorder, ADD, developmental delay comes to the ED for acute onset of jaundice and abnormal lab at PCP office.  Patient also had dark tea colored urine during this time.  Recently was diagnosed with UTI on 10/11 treated with Macrobid.  Upon admission had transaminitis, right upper quadrant ultrasound showed distended gallbladder with dilated CBD 9 mm, no evidence of cholelithiasis.  MRCP shows potential pancreatic head mass with multiple liver lesion.  GI consulted. 10/20: ERCP with duodenum dilation 10/24: biliary drainage placed 10/26: improvement in liver labs, RUQ [place]. Plan to proceed with lesion biopsy, consulted IR. Oncology following 10/27: family has decided to forgo the bx as they will not pursue treatment and instead would like to pursue home hospice. Pt is not able to tolerate much by mouth.  She complains of abdominal pain frequently.  Her vital signs have remained stable and her liver labs continue to mildly improve with the drain in place. Her jaundice is improving.  Patient is stable to dc for hospice 10/30 and can pursue outpatient  palliative procedures if requested.   All other chronic conditions were treated with home medications.   Discharge Condition: Stable, improved Recommended discharge diet: Regular healthy diet  Consultations: IR Oncology Palliative, hospice GI  Procedures/Studies: EGD MRCP ERCP Biliary drain placed   Allergies as of 07/04/2022   No Known Allergies      Medication List     STOP taking these medications    Docusate Sodium 150 MG/15ML syrup   nitrofurantoin (macrocrystal-monohydrate) 100 MG capsule Commonly known as: MACROBID       TAKE these medications    acetaminophen 325 MG tablet Commonly known as: TYLENOL Take 2 tablets (650 mg total) by mouth every 6 (six) hours as needed for mild pain (or Fever >/= 101).   CALCIUM 600 + D PO Take 600 mg by mouth 2 (two) times daily.   Childrens Loratadine 10 MG/10ML syrup Generic drug: loratadine Take 10 mg by mouth daily.   cyanocobalamin 2000 MCG tablet Take 2,500 mcg by mouth daily.   Ensure Powd Take by mouth 2 (two) times daily.   fluvoxaMINE 25 MG tablet Commonly known as: LUVOX Take 25 mg by mouth at bedtime.   folic acid 1 MG tablet Commonly known as: FOLVITE Take 1 mg by mouth daily.   guaiFENesin 100 MG/5ML Soln Commonly known as: ROBITUSSIN Take 5 mLs by mouth every 4 (four) hours as needed for cough or to loosen phlegm.   Myrbetriq 25 MG Tb24 tablet Generic drug: mirabegron ER TAKE 1 TABLET BY MOUTH ONCE DAILY *DO NOT CRUSH OR CHEW*   polyethylene glycol 17 g packet Commonly known as: MIRALAX / GLYCOLAX Take 17 g by mouth daily.  QUEtiapine 400 MG tablet Commonly known as: SEROQUEL Take 800 mg by mouth at bedtime.   RisperDAL 1 MG tablet Generic drug: risperiDONE Take 2 mg by mouth 2 (two) times daily.   THEREMS PO Take 1 tablet by mouth daily.   valproic acid 250 MG/5ML solution Commonly known as: DEPAKENE TAKE 10MLS ('500MG'$ ) BY MOUTH ONCE DAILY IN THE MORNING;TAKE 7.5MLS  ('375MG'$ ) BY MOUTH EVERY NIGHT AT BEDTIME   vitamin E 200 UNIT capsule Take 200 Units by mouth daily.         Subjective   Pt reports no complaints at this time. She looks pleasant and happy about the news that she is going home. Objective  Blood pressure 92/68, pulse 89, temperature 98.9 F (37.2 C), resp. rate 20, SpO2 97 %.   General: Pt is alert, awake, not in acute distress Cardiovascular: RRR, S1/S2 +, no rubs, no gallops Respiratory: CTA bilaterally, no wheezing, no rhonchi Abdominal: Soft, tender to palpation in upper mid quadrant, ND, bowel sounds +. Biliary drain in place. Extremities: no edema, no cyanosis   The results of significant diagnostics from this hospitalization (including imaging, microbiology, ancillary and laboratory) are listed below for reference.   Imaging studies: CT ABD PELVIS W/WO CM ONCOLOGY PANCREATIC PROTOCOL  Result Date: 06/30/2022 CLINICAL DATA:  Multiple liver lesions. EXAM: CT ABDOMEN AND PELVIS WITHOUT AND WITH CONTRAST TECHNIQUE: Multidetector CT imaging of the abdomen and pelvis was performed following the standard protocol before and following the bolus administration of intravenous contrast. RADIATION DOSE REDUCTION: This exam was performed according to the departmental dose-optimization program which includes automated exposure control, adjustment of the mA and/or kV according to patient size and/or use of iterative reconstruction technique. CONTRAST:  125m OMNIPAQUE IOHEXOL 300 MG/ML  SOLN COMPARISON:  MRCP 06/24/2022.  Abdomen and pelvis CT 01/06/2022. FINDINGS: Lower chest: Dependent atelectasis, left greater than right. Hepatobiliary: Percutaneous biliary drain evident. Pneumobilia compatible with drain placement. There are multiple ill-defined hypoattenuating liver lesions, new since 01/06/2022 and suspicious for metastatic disease. Index lesion medial segment left liver measures 1.7 cm on image 33/9. Index lesion inferior right liver  measures 2.0 cm on 52/9. Contrast material in the gallbladder lumen likely residual a of cholangiogram 2 days ago. Gallbladder is ill-defined raising the question of gallbladder wall edema. Mild extrahepatic biliary duct prominence noted. Pancreas: Pancreatic head appears prominent and ill-defined on precontrast imaging. After IV contrast administration heterogeneous enhancement noted in the uncinate process with 2.6 cm hypoattenuating lesion evident, new since 01/06/2022. Diffuse dilatation of the main pancreatic duct noted with overlying parenchymal atrophy. Spleen: Multiple hypoattenuating splenic lesions evident, similar to prior. Adrenals/Urinary Tract: No adrenal nodule or mass. Kidneys unremarkable. No evidence for hydroureter. Bladder is decompressed. Stomach/Bowel: Stomach is unremarkable. No gastric wall thickening. No evidence of outlet obstruction. Duodenum is normally positioned as is the ligament of Treitz. No small bowel wall thickening. No small bowel dilatation. The terminal ileum is normal. The appendix is not well visualized, but there is no edema or inflammation in the region of the cecum. No gross colonic mass. No colonic wall thickening. Vascular/Lymphatic: There is mild atherosclerotic calcification of the abdominal aorta without aneurysm. There is no gastrohepatic or hepatoduodenal ligament lymphadenopathy. No retroperitoneal or mesenteric lymphadenopathy. No pelvic sidewall lymphadenopathy. Reproductive: Uterus surgically absent.  There is no adnexal mass. Other: Small volume free fluid seen in the pelvis and adjacent to the liver. Musculoskeletal: Bone mineralization is diffusely heterogeneous without a discrete worrisome lytic or sclerotic osseous abnormality evident. IMPRESSION:  1. Multiple ill-defined hypoattenuating liver lesions, new since 01/06/2022 and suspicious for metastatic disease. 2. 2.6 cm hypoattenuating lesion in the uncinate process of the pancreas with diffuse dilatation  of the main pancreatic duct and overlying parenchymal atrophy. Pancreatic neoplasm a concern. Endoscopic ultrasound may prove helpful to further evaluate. 3. Percutaneous biliary drain evident with pneumobilia compatible with drain placement. 4. Small volume free fluid in the pelvis and adjacent to the liver. 5. Multiple hypoattenuating splenic lesions, similar to prior. These are new since CT of 11/20/2017. While potentially benign, metastatic disease not excluded. 6. Dependent atelectasis, left greater than right. 7.  Aortic Atherosclerosis (ICD10-I70.0). Electronically Signed   By: Misty Stanley M.D.   On: 06/30/2022 15:36   US Abdomen Limited RUQ (LIVER/GB)  Result Date: 06/30/2022 CLINICAL DATA:  Evaluate possible metastatic disease to the liver EXAM: ULTRASOUND ABDOMEN LIMITED RIGHT UPPER QUADRANT COMPARISON:  None Available. FINDINGS: Gallbladder: No gallstones or wall thickening visualized. No sonographic Murphy sign noted by sonographer. Common bile duct: Diameter: Not well visualized Liver: No focal lesion identified. Within normal limits in parenchymal echogenicity. Portal vein is patent on color Doppler imaging with normal direction of blood flow towards the liver. Other: None. IMPRESSION: No focal lesions identified with liver by ultrasound. No findings amenable for ultrasound-guided biopsy. Electronically Signed   By: Albin Felling M.D.   On: 06/30/2022 13:50   IR INT EXT BILIARY DRAIN WITH CHOLANGIOGRAM  Result Date: 06/28/2022 INDICATION: Jaundice.  Question malignant biliary obstruction. EXAM: PERCUTANEOUS TRANSHEPATIC BILIARY TUBE PLACEMENT and ANTEROGRADE CHOLANGIOGRAM COMPARISON:  MR abdomen/MRCP, 06/24/2022. US Abdomen, 06/23/2022. PET-CT, 02/23/2022. MEDICATIONS: Cefoxitin, 2 g IV; The antibiotic was administered with an appropriate time frame prior to the initiation of the procedure CONTRAST:  26m OMNIPAQUE IOHEXOL 300 MG/ML SOLN, 19mOMNIPAQUE IOHEXOL 300 MG/ML SOLN -  administered into the biliary tree. ANESTHESIA/SEDATION: Moderate (conscious) sedation was employed during this procedure. A total of Versed 2 mg and Fentanyl 100 mcg was administered intravenously. Moderate Sedation Time: 41 minutes. The patient's level of consciousness and vital signs were monitored continuously by radiology nursing throughout the procedure under my direct supervision. FLUOROSCOPY TIME:  Fluoroscopic dose; 8503.5Gy COMPLICATIONS: None immediate. TECHNIQUE: Informed written consent was obtained from the patient and/or patient's representative after a discussion of the risks, benefits and alternatives to treatment. Questions regarding the procedure were encouraged and answered. A timeout was performed prior to the initiation of the procedure. The epigastrium and RIGHT upper abdominal quadrant was prepped and draped in the usual sterile fashion, and a sterile drape was applied covering the operative field. Maximum barrier sterile technique with sterile gowns and gloves were used for the procedure. A timeout was performed prior to the initiation of the procedure. Ultrasound scanning of the right upper abdominal quadrant was performed to delineate the anatomy and avoid transgression of the gallbladder or the pleural. A spot along the subcostal epigastrium was marked sonographically. After the overlying soft tissues were anesthetized with 1% Lidocaine with epinephrine, under direct ultrasound guidance, a 22 gauge Chiba needle was utilized to cannulate the peripheral aspect of a LEFT intrahepatic biliary duct. Appropriate position was confirmed with limited contrast injection. Next, the duct was cannulated with a Nitrex wire and dilated with an Accustick set under fluoroscopic guidance. Limited cholangiograms were performed in various obliquities confirming appropriate access. Next, the outer sheath of the Accustick set and with the use of a stiff Glidewire, advanced through the biliary hilum, common  bile duct and ampulla to the level  of the duodenum. Contrast injection confirmed appropriate positioning. Under intermittent fluoroscopic guidance and over an Amplatz wire, the track was dilated ultimately allowing placement of a 10 Fr biliary drainage catheter with coil ultimately locked within the duodenum. Contrast was injected and a completion radiographs were obtained in various obliquities. The catheter was connected to a drainage bag which yielded the brisk return of clear bile. The catheter was secured to the skin with an interrupted suture and StatLock device. Dressings were applied. The patient tolerated the procedure well without immediate postprocedural complication. FINDINGS: Sonographic evaluation of the liver demonstrates marked intrahepatic biliary ductal dilatation as was demonstrated on preceding cross-sectional imaging, including MRCP. Under direct ultrasound guidance, a dilated peripheral duct within the LEFT lobe of liver was accessed allowing placement of a 10 Fr biliary drainage catheter with end ultimately coiled and locked within the duodenum and radiopaque side marker located proximal to the level of the biliary hilum. Limited contrast injection demonstrates marked dilatation of the CBD and intrahepatic biliary tree with communication between the right and left biliary trees at the level of the hilum. IMPRESSION: 1. Cholangiogram demonstrating marked intrahepatic and extrahepatic biliary ductal dilatation, similar to findings on corresponding MRCP. 2. Successful placement of a 10 Fr percutaneous biliary drainage catheter via LEFT transhepatic approach, with marker located proximal to the level of the biliary hilum and biliary catheter tip within the duodenum, as described above. PLAN: Patient will return to [facility] ([facility]) for routine drainage catheter evaluation and exchange in 8 weeks. Michaelle Birks, MD [facility]  Radiology Electronically Signed   By: Michaelle Birks M.D.   On: 06/28/2022 15:20   DG C-Arm 1-60 Min-No Report  Result Date: 06/24/2022 Fluoroscopy was utilized by the requesting physician.  No radiographic interpretation.   MR ABDOMEN MRCP WO CONTRAST  Result Date: 06/24/2022 CLINICAL DATA:  56 year old female with history of jaundice. EXAM: MRI ABDOMEN WITHOUT CONTRAST  (INCLUDING MRCP) TECHNIQUE: Multiplanar multisequence MR imaging of the abdomen was performed. Heavily T2-weighted images of the biliary and pancreatic ducts were obtained, and three-dimensional MRCP images were rendered by post processing. COMPARISON:  No prior abdominal MRI. Abdominal ultrasound 06/23/2022. PET-CT 02/23/2022. CT of the chest, abdomen and pelvis 01/06/2022. FINDINGS: Comment: Today's study is severely limited for detection and characterization of visceral and/or vascular lesions by lack of IV gadolinium. Lower chest: Small bilateral pleural effusions lie dependently. Hepatobiliary: Multiple poorly defined hepatic lesions are noted which are low T1 signal and mildly high T2 signal intensity, incompletely evaluated on today's noncontrast examination, but new compared to prior PET-CT and CT examinations. The largest of these lesions measure up to 2.1 x 1.0 cm in the periphery of segment 5 (axial image 21 of series 4) and 1.9 x 1.3 cm in the central aspect of segment 7 (axial image 12 of series 4). There is a suggestion of some peripheral diffusion restriction for several of these lesions. Severe intra and extrahepatic biliary ductal dilatation noted, new compared to the prior examination, measuring up to 12 mm in the porta hepatis. Abrupt cut off of the distal common bile duct immediately before the level of the ampulla. No filling defect in the common bile duct to suggest choledocholithiasis. Gallbladder is severely distended. Gallbladder wall does not appear thickened. No pericholecystic fluid or surrounding inflammatory  changes. Pancreas: In the head of the pancreas (axial image 55 of series 10) there is an area of potential diffusion restriction. This is poorly demonstrated on today's noncontrast  images, and difficult to discretely measure, but concerning for potential pancreatic head mass. Associated with this there is dilatation of the main pancreatic duct which measures up to 6 mm in the body of the pancreas, along with diffuse side branch ectasia. No peripancreatic fluid collections or inflammatory changes. Spleen: Multiple T1 and T2 hypointense spleen lesions, incompletely characterized on today's noncontrast examination, but measuring up to 2.3 cm (axial image 7 of series 4). Adrenals/Urinary Tract: No suspicious renal lesions are noted on today's noncontrast examination. 8 mm T1 hypointense, T2 hyperintense lesion in the upper pole of the left kidney posteriorly, statistically likely a small cysts (no imaging follow-up recommended). Bilateral extrarenal pelvises. 2 filling defects are noted in the right renal pelvis on axial image 20 of series 4 and 22 of series 4, potentially calculi. Bilateral adrenal glands are normal in appearance. Stomach/Bowel: Visualized portions are unremarkable. Vascular/Lymphatic: No aneurysm identified in the visualized abdominal vasculature on today's noncontrast examination. Other: No significant volume of ascites noted in the visualized portions of the peritoneal cavity. Musculoskeletal: No aggressive appearing osseous lesions are noted in the visualized portions of the skeleton. IMPRESSION: 1. Severely limited examination secondary to lack of IV gadolinium. With these limitations in mind, there is severe intra and extrahepatic biliary ductal dilatation, along with diffuse pancreatic ductal dilatation, and there is a suggestion of a mass in the head of the pancreas based on diffusion weighted images concerning for malignancy. Further evaluation with repeat abdominal MRI with and without IV  gadolinium with MRCP is recommended to better evaluate this finding. Alternatively, endoscopic ultrasound and tissue sampling could be considered if clinically appropriate. 2. Multiple indeterminate liver lesions, poorly characterized on today's noncontrast examination, but new compared to prior CT and PET-CT examinations. The possibility of metastatic disease in the liver should be considered. Other aggressive etiology such as multifocal hepatic abscesses could also be considered in the appropriate clinical setting, however, the degree of diffusion restriction noted with these lesions is less than would be expected for abscesses. 3. Multiple indeterminate splenic lesions stable compared to prior CT examinations and not associated with diffusion restriction, likely benign lesions. 4. Small bilateral pleural effusions lying dependently. Electronically Signed   By: Vinnie Langton M.D.   On: 06/24/2022 06:19   MR 3D Recon At Scanner  Result Date: 06/24/2022 CLINICAL DATA:  56 year old female with history of jaundice. EXAM: MRI ABDOMEN WITHOUT CONTRAST  (INCLUDING MRCP) TECHNIQUE: Multiplanar multisequence MR imaging of the abdomen was performed. Heavily T2-weighted images of the biliary and pancreatic ducts were obtained, and three-dimensional MRCP images were rendered by post processing. COMPARISON:  No prior abdominal MRI. Abdominal ultrasound 06/23/2022. PET-CT 02/23/2022. CT of the chest, abdomen and pelvis 01/06/2022. FINDINGS: Comment: Today's study is severely limited for detection and characterization of visceral and/or vascular lesions by lack of IV gadolinium. Lower chest: Small bilateral pleural effusions lie dependently. Hepatobiliary: Multiple poorly defined hepatic lesions are noted which are low T1 signal and mildly high T2 signal intensity, incompletely evaluated on today's noncontrast examination, but new compared to prior PET-CT and CT examinations. The largest of these lesions measure up to  2.1 x 1.0 cm in the periphery of segment 5 (axial image 21 of series 4) and 1.9 x 1.3 cm in the central aspect of segment 7 (axial image 12 of series 4). There is a suggestion of some peripheral diffusion restriction for several of these lesions. Severe intra and extrahepatic biliary ductal dilatation noted, new compared to the prior examination, measuring up  to 12 mm in the porta hepatis. Abrupt cut off of the distal common bile duct immediately before the level of the ampulla. No filling defect in the common bile duct to suggest choledocholithiasis. Gallbladder is severely distended. Gallbladder wall does not appear thickened. No pericholecystic fluid or surrounding inflammatory changes. Pancreas: In the head of the pancreas (axial image 55 of series 10) there is an area of potential diffusion restriction. This is poorly demonstrated on today's noncontrast images, and difficult to discretely measure, but concerning for potential pancreatic head mass. Associated with this there is dilatation of the main pancreatic duct which measures up to 6 mm in the body of the pancreas, along with diffuse side branch ectasia. No peripancreatic fluid collections or inflammatory changes. Spleen: Multiple T1 and T2 hypointense spleen lesions, incompletely characterized on today's noncontrast examination, but measuring up to 2.3 cm (axial image 7 of series 4). Adrenals/Urinary Tract: No suspicious renal lesions are noted on today's noncontrast examination. 8 mm T1 hypointense, T2 hyperintense lesion in the upper pole of the left kidney posteriorly, statistically likely a small cysts (no imaging follow-up recommended). Bilateral extrarenal pelvises. 2 filling defects are noted in the right renal pelvis on axial image 20 of series 4 and 22 of series 4, potentially calculi. Bilateral adrenal glands are normal in appearance. Stomach/Bowel: Visualized portions are unremarkable. Vascular/Lymphatic: No aneurysm identified in the visualized  abdominal vasculature on today's noncontrast examination. Other: No significant volume of ascites noted in the visualized portions of the peritoneal cavity. Musculoskeletal: No aggressive appearing osseous lesions are noted in the visualized portions of the skeleton. IMPRESSION: 1. Severely limited examination secondary to lack of IV gadolinium. With these limitations in mind, there is severe intra and extrahepatic biliary ductal dilatation, along with diffuse pancreatic ductal dilatation, and there is a suggestion of a mass in the head of the pancreas based on diffusion weighted images concerning for malignancy. Further evaluation with repeat abdominal MRI with and without IV gadolinium with MRCP is recommended to better evaluate this finding. Alternatively, endoscopic ultrasound and tissue sampling could be considered if clinically appropriate. 2. Multiple indeterminate liver lesions, poorly characterized on today's noncontrast examination, but new compared to prior CT and PET-CT examinations. The possibility of metastatic disease in the liver should be considered. Other aggressive etiology such as multifocal hepatic abscesses could also be considered in the appropriate clinical setting, however, the degree of diffusion restriction noted with these lesions is less than would be expected for abscesses. 3. Multiple indeterminate splenic lesions stable compared to prior CT examinations and not associated with diffusion restriction, likely benign lesions. 4. Small bilateral pleural effusions lying dependently. Electronically Signed   By: Vinnie Langton M.D.   On: 06/24/2022 06:19   US ABDOMEN LIMITED RUQ (LIVER/GB)  Result Date: 06/23/2022 CLINICAL DATA:  Elevated LFTs EXAM: ULTRASOUND ABDOMEN LIMITED RIGHT UPPER QUADRANT COMPARISON:  CT 01/06/2022 FINDINGS: Gallbladder: Distended gallbladder. No gallstones or wall thickening visualized. No sonographic Murphy sign noted by sonographer. Common bile duct:  Diameter: 9 mm Liver: No focal lesion identified. Within normal limits in parenchymal echogenicity. Portal vein is patent on color Doppler imaging with normal direction of blood flow towards the liver. Other: None. IMPRESSION: Distended gallbladder with dilation of the common bile duct measuring 9 mm. This is grossly similar to PET/CT 02/23/2022 given differences in technique. Consider MRI/MRCP for further evaluation if there is concern for obstructing stone or mass. No evidence of cholecystitis. Electronically Signed   By: Carroll Kinds.D.  On: 06/23/2022 21:25    Labs: Basic Metabolic Panel: Recent Labs  Lab 06/29/22 0505 06/30/22 0519  NA 139  --   K 4.0  --   CL 106  --   CO2 24  --   GLUCOSE 86  --   BUN 21*  --   CREATININE 0.55  --   CALCIUM 8.7*  --   MG 2.1 1.9   CBC: Recent Labs  Lab 06/29/22 0505 06/30/22 0519 07/01/22 0527  WBC 5.2 5.5 6.6  HGB 12.3 11.9* 11.5*  HCT 37.4 36.5 35.7*  MCV 96.9 98.1 97.3  PLT 178 186 181   Microbiology: Results for orders placed or performed in visit on 05/13/20  Microscopic Examination     Status: Abnormal   Collection Time: 05/14/20  8:14 AM   Urine  Result Value Ref Range Status   WBC, UA 11-30 (A) 0 - 5 /hpf Final   RBC, Urine 0-2 0 - 2 /hpf Final   Epithelial Cells (non renal) 0-10 0 - 10 /hpf Final   Renal Epithel, UA 0-10 (A) None seen /hpf Final   Bacteria, UA Few None seen/Few Final    Time coordinating discharge: Over 30 minutes  Richarda Osmond, MD  [facility] 07/05/2022, 5:35 PM

## 2022-07-02 DIAGNOSIS — K8050 Calculus of bile duct without cholangitis or cholecystitis without obstruction: Secondary | ICD-10-CM | POA: Diagnosis not present

## 2022-07-02 DIAGNOSIS — K851 Biliary acute pancreatitis without necrosis or infection: Secondary | ICD-10-CM | POA: Diagnosis not present

## 2022-07-02 DIAGNOSIS — G40319 Generalized idiopathic epilepsy and epileptic syndromes, intractable, without status epilepticus: Secondary | ICD-10-CM | POA: Diagnosis not present

## 2022-07-02 DIAGNOSIS — R6250 Unspecified lack of expected normal physiological development in childhood: Secondary | ICD-10-CM | POA: Diagnosis not present

## 2022-07-02 LAB — HEPATIC FUNCTION PANEL
ALT: 41 U/L (ref 0–44)
AST: 38 U/L (ref 15–41)
Albumin: 2.2 g/dL — ABNORMAL LOW (ref 3.5–5.0)
Alkaline Phosphatase: 389 U/L — ABNORMAL HIGH (ref 38–126)
Bilirubin, Direct: 4.6 mg/dL — ABNORMAL HIGH (ref 0.0–0.2)
Indirect Bilirubin: 3.5 mg/dL — ABNORMAL HIGH (ref 0.3–0.9)
Total Bilirubin: 8.1 mg/dL — ABNORMAL HIGH (ref 0.3–1.2)
Total Protein: 5 g/dL — ABNORMAL LOW (ref 6.5–8.1)

## 2022-07-02 NOTE — Progress Notes (Signed)
[facility]) Hospital Liaison Note   Liaison continues to follow for disposition needs.  Plan is for patient to discharge home with mother, no DME needs identified.   Please call with any questions or concerns.   Thank you Jhonnie Garner, BSN, RN [facility] Liaison [phone]

## 2022-07-02 NOTE — Plan of Care (Signed)

## 2022-07-02 NOTE — Progress Notes (Signed)
[facility]            Reiley Bertagnolli, is a 56 y.o. female, DOB - 07/04/1966, [id number] Admit date - 06/23/2022    Outpatient Primary MD for the patient is [facility] [place]  LOS - 9  days  Brief summary   56 year old with history of seizure disorder, ADD, developmental delay comes to the ED for acute onset of jaundice and abnormal lab at PCP office.  Patient also had dark tea colored urine during this time.  Recently was diagnosed with UTI on 10/11 treated with Macrobid.  Upon admission had transaminitis, right upper quadrant ultrasound showed distended gallbladder with dilated CBD 9 mm, no evidence of cholelithiasis.  MRCP shows potential pancreatic head mass with multiple liver lesion.  GI consulted. 10/20: ERCP 10/24: biliary drainage placed 10/26: improvement in liver labs, RUQ [place]. Plan to proceed with lesion biopsy, consulted IR. Oncology following 10/27: family has decided to forgo the bx as they will not pursue treatment and instead would like to pursue home hospice.  Assessment & Plan   GOC- with continued poor prognosis despite receiving full care for underlying cause, family has decided to transition to comfort care at this time. Invasive and life-prolonging interventions that can be painful or anxiety provoking will be discontinued at this time.  - further recommendations by palliative.  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 Bx forgone for GOC reasons and lesions not amenable to  BX, per IR.  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[place]) - Stable, continue Depakote  Code Status: comfort care, DNR DVT Prophylaxis:  enoxaparin (LOVENOX) injection 40 mg Start: 06/24/22 0800  Level of Care: Level of care: Med-Surg Family Communication: updated patient's mother at bedside Disposition Plan:      Remains inpatient appropriate: pending discharge for hospice  Procedures:  ERCP 10/20 Biliary drain placed by IR 10/24  Consultants:   GI IR Oncology   Antimicrobials:   Anti-infectives (From admission, onward)    Start     Dose/Rate Route Frequency Ordered Stop   06/28/22 1300  cefOXitin (MEFOXIN) 2 g in sodium chloride 0.9 % 100 mL IVPB        2 g 200 mL/hr over 30 Minutes Intravenous On call 06/28/22 1201 06/29/22 1300   06/28/22 1219  cefOXitin (MEFOXIN) 2 g in sodium chloride 0.9 % 100 mL IVPB        over 30 Minutes  Continuous PRN 06/28/22 1219 06/28/22 1219   06/27/22 0800  cefOXitin (MEFOXIN) 2 g in sodium chloride 0.9 % 100 mL IVPB       Note to Pharmacy: To radiology for procedure   2 g 200 mL/hr over 30 Minutes Intravenous On call 06/24/22 1717 06/28/22 0800       Medications  calcium-vitamin D   Oral BID WC   cyanocobalamin  2,000 mcg Oral Daily  diclofenac  100 mg Rectal Once   enoxaparin (LOVENOX) injection  40 mg Subcutaneous Q24H   feeding supplement   Oral BID BM   folic acid  1 mg Oral Daily   mirabegron ER  25 mg Oral Daily   multivitamin with minerals   Oral Daily   polyethylene glycol  17 g Oral Daily   QUEtiapine  800 mg Oral QHS   risperidone  2 mg Oral BID   sodium chloride flush  5 mL Intracatheter Q8H   valproic acid  500 mg Oral Daily   And   valproic acid  375 mg Oral QHS   vitamin E  200  Units Oral Daily    Subjective:   Aislinn Feliz is awake and pleasant. She states that she has belly pain and says something I didn't quite understand repeatedly.   Objective:   Vitals:   07/01/22 1529 07/01/22 2049 07/01/22 2326 07/02/22 0624  BP: (!) 151/85 101/72 (!) 115/101 101/77  Pulse: 89 88 86 80  Resp: '15 17 16 20  '$ Temp: 98.2 F (36.8 C) 98 F (36.7 C) (!) 97.5 F (36.4 C)   TempSrc:      SpO2: 98% 98% 98% 97%    Intake/Output Summary (Last 24 hours) at 07/02/2022 0724 Last data filed at 07/02/2022 0517 Gross per 24 hour  Intake 235 ml  Output 100 ml  Net 135 ml    Physical Exam General: NAD, comfortable Cardiovascular: quick capillary refill  Respiratory: CTAB, no wheezing Gastrointestinal: Soft, nontender, nondistended, NBS. Drain in place Ext: no pedal edema bilaterally Psych: appears close to her baseline, per mom  Data Reviewed:  I have personally reviewed following labs   CBC Lab Results  Component Value Date   WBC 6.6 07/01/2022   RBC 3.67 (L) 07/01/2022   HGB 11.5 (L) 07/01/2022   HCT 35.7 (L) 07/01/2022   MCV 97.3 07/01/2022   MCH 31.3 07/01/2022   PLT 181 07/01/2022   MCHC 32.2 07/01/2022   RDW 13.8 07/01/2022   LYMPHSABS 2.3 06/27/2022   MONOABS 1.2 (H) 06/27/2022   EOSABS 0.0 06/27/2022   BASOSABS 0.0 84/66/5993   Last metabolic panel Lab Results  Component Value Date   NA 139 06/29/2022   K 4.0 06/29/2022   CL 106 06/29/2022   CO2 24 06/29/2022   BUN 21 (H) 06/29/2022   CREATININE 0.55 06/29/2022   GLUCOSE 86 06/29/2022   GFRNONAA >60 06/29/2022   GFRAA 114 05/08/2019   CALCIUM 8.7 (L) 06/29/2022   PROT 5.0 (L) 07/02/2022   ALBUMIN 2.2 (L) 07/02/2022   LABGLOB 2.2 02/04/2022   AGRATIO 2.1 05/08/2019   BILITOT 8.1 (H) 07/02/2022   ALKPHOS 389 (H) 07/02/2022   AST 38 07/02/2022   ALT 41 07/02/2022   ANIONGAP 9 06/29/2022    CBG (last 3)  Recent Labs    06/30/22 0823  GLUCAP 65*     Coagulation  Profile: Recent Labs  Lab 06/27/22 0309  INR 2.2*    Radiology Studies: I have personally reviewed the imaging studies  CT ABD PELVIS W/WO CM ONCOLOGY PANCREATIC PROTOCOL  Result Date: 06/30/2022 CLINICAL DATA:  Multiple liver lesions. EXAM: CT ABDOMEN AND PELVIS WITHOUT AND WITH CONTRAST TECHNIQUE: Multidetector CT imaging of the abdomen and pelvis was performed following the standard protocol before and following the bolus administration of intravenous contrast. RADIATION DOSE REDUCTION: This exam was performed according to the departmental dose-optimization program which includes automated exposure control, adjustment of the mA and/or  kV according to patient size and/or use of iterative reconstruction technique. CONTRAST:  143m OMNIPAQUE IOHEXOL 300 MG/ML  SOLN COMPARISON:  MRCP 06/24/2022.  Abdomen and pelvis CT 01/06/2022. FINDINGS: Lower chest: Dependent atelectasis, left greater than right. Hepatobiliary: Percutaneous biliary drain evident. Pneumobilia compatible with drain placement. There are multiple ill-defined hypoattenuating liver lesions, new since 01/06/2022 and suspicious for metastatic disease. Index lesion medial segment left liver measures 1.7 cm on image 33/9. Index lesion inferior right liver measures 2.0 cm on 52/9. Contrast material in the gallbladder lumen likely residual a of cholangiogram 2 days ago. Gallbladder is ill-defined raising the question of gallbladder wall edema. Mild extrahepatic biliary duct prominence noted. Pancreas: Pancreatic head appears prominent and ill-defined on precontrast imaging. After IV contrast administration heterogeneous enhancement noted in the uncinate process with 2.6 cm hypoattenuating lesion evident, new since 01/06/2022. Diffuse dilatation of the main pancreatic duct noted with overlying parenchymal atrophy. Spleen: Multiple hypoattenuating splenic lesions evident, similar to prior. Adrenals/Urinary Tract: No adrenal nodule or mass. Kidneys  unremarkable. No evidence for hydroureter. Bladder is decompressed. Stomach/Bowel: Stomach is unremarkable. No gastric wall thickening. No evidence of outlet obstruction. Duodenum is normally positioned as is the ligament of Treitz. No small bowel wall thickening. No small bowel dilatation. The terminal ileum is normal. The appendix is not well visualized, but there is no edema or inflammation in the region of the cecum. No gross colonic mass. No colonic wall thickening. Vascular/Lymphatic: There is mild atherosclerotic calcification of the abdominal aorta without aneurysm. There is no gastrohepatic or hepatoduodenal ligament lymphadenopathy. No retroperitoneal or mesenteric lymphadenopathy. No pelvic sidewall lymphadenopathy. Reproductive: Uterus surgically absent.  There is no adnexal mass. Other: Small volume free fluid seen in the pelvis and adjacent to the liver. Musculoskeletal: Bone mineralization is diffusely heterogeneous without a discrete worrisome lytic or sclerotic osseous abnormality evident. IMPRESSION: 1. Multiple ill-defined hypoattenuating liver lesions, new since 01/06/2022 and suspicious for metastatic disease. 2. 2.6 cm hypoattenuating lesion in the uncinate process of the pancreas with diffuse dilatation of the main pancreatic duct and overlying parenchymal atrophy. Pancreatic neoplasm a concern. Endoscopic ultrasound may prove helpful to further evaluate. 3. Percutaneous biliary drain evident with pneumobilia compatible with drain placement. 4. Small volume free fluid in the pelvis and adjacent to the liver. 5. Multiple hypoattenuating splenic lesions, similar to prior. These are new since CT of 11/20/2017. While potentially benign, metastatic disease not excluded. 6. Dependent atelectasis, left greater than right. 7.  Aortic Atherosclerosis (ICD10-I70.0). Electronically Signed   By: EMisty StanleyM.D.   On: 06/30/2022 15:36   [place]Abdomen Limited RUQ (LIVER/GB)  Result Date:  06/30/2022 CLINICAL DATA:  Evaluate possible metastatic disease to the liver EXAM: ULTRASOUND ABDOMEN LIMITED RIGHT UPPER QUADRANT COMPARISON:  None Available. FINDINGS: Gallbladder: No gallstones or wall thickening visualized. No sonographic Murphy sign noted by sonographer. Common bile duct: Diameter: Not well visualized Liver: No focal lesion identified. Within normal limits in parenchymal echogenicity. Portal vein is patent on color Doppler imaging with normal direction of blood flow towards the liver. Other: None. IMPRESSION: No focal lesions identified with liver by ultrasound. No findings amenable for ultrasound-guided biopsy. Electronically Signed   By: YAlbin FellingM.D.   On: 06/30/2022 13:50     CRicharda OsmondM.D. [facility] 07/02/2022, 7:24 AM  Available via Epic secure chat 7am-7pm After 7 pm, please refer to night coverage provider listed on amion.

## 2022-07-02 NOTE — Plan of Care (Signed)

## 2022-07-02 NOTE — TOC Progression Note (Signed)
Transition of Care [facility]) - Progression Note    Patient Details  Name: Roberta Hampton MRN: [id number] Date of Birth: [birth year]  Transition of Care [facility]) CM/SW [place], LCSW Phone Number: 07/02/2022, 4:11 PM  Clinical Narrative:    Patient medically ready for DC home with hospice per MD.  Notified Misty with [facility] who confirms they will follow at home, first nurse visit would be Monday. Per RN, patient's Mother is requesting DC Monday as she does not have family that can help her with patient's care at home until Monday. MD notified.         Expected Discharge Plan and Services                                                 Social Determinants of Health (SDOH) Interventions    Readmission Risk Interventions     No data to display

## 2022-07-03 DIAGNOSIS — K8050 Calculus of bile duct without cholangitis or cholecystitis without obstruction: Secondary | ICD-10-CM | POA: Diagnosis not present

## 2022-07-03 DIAGNOSIS — G40319 Generalized idiopathic epilepsy and epileptic syndromes, intractable, without status epilepticus: Secondary | ICD-10-CM | POA: Diagnosis not present

## 2022-07-03 DIAGNOSIS — K851 Biliary acute pancreatitis without necrosis or infection: Secondary | ICD-10-CM | POA: Diagnosis not present

## 2022-07-03 DIAGNOSIS — R6250 Unspecified lack of expected normal physiological development in childhood: Secondary | ICD-10-CM | POA: Diagnosis not present

## 2022-07-03 LAB — HEPATIC FUNCTION PANEL
ALT: 41 U/L (ref 0–44)
AST: 42 U/L — ABNORMAL HIGH (ref 15–41)
Albumin: 2.3 g/dL — ABNORMAL LOW (ref 3.5–5.0)
Alkaline Phosphatase: 359 U/L — ABNORMAL HIGH (ref 38–126)
Bilirubin, Direct: 4.4 mg/dL — ABNORMAL HIGH (ref 0.0–0.2)
Indirect Bilirubin: 3.3 mg/dL — ABNORMAL HIGH (ref 0.3–0.9)
Total Bilirubin: 7.7 mg/dL — ABNORMAL HIGH (ref 0.3–1.2)
Total Protein: 5.1 g/dL — ABNORMAL LOW (ref 6.5–8.1)

## 2022-07-03 MED ORDER — LORATADINE 10 MG PO TABS
10.0000 mg | ORAL_TABLET | Freq: Every day | ORAL | Status: DC
  Administered 2022-07-04: 10 mg via ORAL
  Filled 2022-07-03: qty 1

## 2022-07-03 NOTE — Progress Notes (Signed)
[facility]            Orissa Arreaga, is a 56 y.o. female, DOB - 1966/06/20, XBJ:[id number] Admit date - 06/23/2022    Outpatient Primary MD for the patient is [facility] [place]  LOS - 10  days  Brief summary   56 year old with history of seizure disorder, ADD, developmental delay comes to the ED for acute onset of jaundice and abnormal lab at PCP office.  Patient also had dark tea colored urine during this time.  Recently was diagnosed with UTI on 10/11 treated with Macrobid.  Upon admission had transaminitis, right upper quadrant ultrasound showed distended gallbladder with dilated CBD 9 mm, no evidence of cholelithiasis.  MRCP shows potential pancreatic head mass with multiple liver lesion.  GI consulted. 10/20: ERCP 10/24: biliary drainage placed 10/26: improvement in liver labs, RUQ [place]. Plan to proceed with lesion biopsy, consulted IR. Oncology following 10/27: family has decided to forgo the bx as they will not pursue treatment and instead would like to pursue home hospice.  Assessment & Plan   GOC- with continued poor prognosis despite receiving full care for underlying cause, family has decided to transition to comfort care at this time. Invasive and life-prolonging interventions that can be painful or anxiety provoking will be discontinued at this time.  - further recommendations by palliative.  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 Bx forgone  for GOC reasons and lesions not amenable to BX, per IR.  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[place]) - Stable, continue Depakote  Code Status: comfort care, DNR DVT Prophylaxis:  enoxaparin (LOVENOX) injection 40 mg Start: 06/24/22 0800  Level of Care: Level of care: Med-Surg Family Communication: updated patient's mother at bedside Disposition Plan:      Remains inpatient appropriate: pending discharge for hospice  Procedures:  ERCP 10/20 Biliary drain placed by IR 10/24  Consultants:   GI IR Oncology  Palliative   Antimicrobials:   Anti-infectives (From admission, onward)    Start     Dose/Rate Route Frequency Ordered Stop   06/28/22 1300  cefOXitin (MEFOXIN) 2 g in sodium chloride 0.9 % 100 mL IVPB        2 g 200 mL/hr over 30 Minutes Intravenous On call 06/28/22 1201 06/29/22 1300   06/28/22 1219  cefOXitin (MEFOXIN) 2 g in sodium chloride 0.9 % 100 mL IVPB        over 30 Minutes  Continuous PRN 06/28/22 1219 06/28/22 1219   06/27/22 0800  cefOXitin (MEFOXIN) 2 g in sodium chloride 0.9 % 100 mL IVPB       Note to Pharmacy: To radiology for procedure   2 g 200 mL/hr over 30 Minutes Intravenous On call 06/24/22 1717 06/28/22 0800       Medications  calcium-vitamin D   Oral  BID WC   cyanocobalamin  2,000 mcg Oral Daily   diclofenac  100 mg Rectal Once   enoxaparin (LOVENOX) injection  40 mg Subcutaneous Q24H   feeding supplement   Oral BID BM   folic acid  1 mg Oral Daily   mirabegron ER  25 mg Oral Daily   multivitamin with minerals   Oral Daily   polyethylene glycol  17 g Oral Daily   QUEtiapine  800 mg Oral QHS   risperidone  2 mg Oral BID   sodium chloride flush  5 mL Intracatheter Q8H   valproic acid  500 mg Oral Daily    And   valproic acid  375 mg Oral QHS   vitamin E  200 Units Oral Daily    Subjective:   Roberta Hampton is awake and pleasant. She is sitting with her mom and happily talking with her.  Objective:   Vitals:   07/02/22 0624 07/02/22 0904 07/02/22 1701 07/03/22 0019  BP: 101/77 99/83 129/88 123/81  Pulse: 80 79 85 86  Resp: 20   18  Temp:  98.4 F (36.9 C) 97.6 F (36.4 C) 98.3 F (36.8 C)  TempSrc:    Oral  SpO2: 97% 99% 99% 100%    Intake/Output Summary (Last 24 hours) at 07/03/2022 0735 Last data filed at 07/03/2022 0029 Gross per 24 hour  Intake 5 ml  Output 540 ml  Net -535 ml    Physical Exam General: NAD, comfortable Cardiovascular: quick capillary refill  Respiratory: CTAB, no wheezing Gastrointestinal: Soft, nontender, nondistended, NBS. Drain in place Ext: no pedal edema bilaterally Psych: appears close to her baseline, per mom  Data Reviewed:  I have personally reviewed following labs   CBC Lab Results  Component Value Date   WBC 6.6 07/01/2022   RBC 3.67 (L) 07/01/2022   HGB 11.5 (L) 07/01/2022   HCT 35.7 (L) 07/01/2022   MCV 97.3 07/01/2022   MCH 31.3 07/01/2022   PLT 181 07/01/2022   MCHC 32.2 07/01/2022   RDW 13.8 07/01/2022   LYMPHSABS 2.3 06/27/2022   MONOABS 1.2 (H) 06/27/2022   EOSABS 0.0 06/27/2022   BASOSABS 0.0 20/35/5974   Last metabolic panel Lab Results  Component Value Date   NA 139 06/29/2022   K 4.0 06/29/2022   CL 106 06/29/2022   CO2 24 06/29/2022   BUN 21 (H) 06/29/2022   CREATININE 0.55 06/29/2022   GLUCOSE 86 06/29/2022   GFRNONAA >60 06/29/2022   GFRAA 114 05/08/2019   CALCIUM 8.7 (L) 06/29/2022   PROT 5.1 (L) 07/03/2022   ALBUMIN 2.3 (L) 07/03/2022   LABGLOB 2.2 02/04/2022   AGRATIO 2.1 05/08/2019   BILITOT 7.7 (H) 07/03/2022   ALKPHOS 359 (H) 07/03/2022   AST 42 (H) 07/03/2022   ALT 41 07/03/2022   ANIONGAP 9 06/29/2022    CBG (last 3)  Recent Labs    06/30/22 0823  GLUCAP 65*     Coagulation  Profile: Recent Labs  Lab 06/27/22 0309  INR 2.2*    Radiology Studies: I have personally reviewed the imaging studies  No results found.   Richarda Osmond M.D. [facility] 07/03/2022, 7:35 AM  Available via Epic secure chat 7am-7pm After 7 pm, please refer to night coverage provider listed on amion.

## 2022-07-03 NOTE — TOC Progression Note (Signed)
Transition of Care [facility]) - Progression Note    Patient Details  Name: Roberta Hampton MRN: [id number] Date of Birth: June 20, 1966  Transition [facility] Care [facility]) CM/SW [place], LCSW Phone Number: 07/03/2022, 1:36 PM  Clinical Narrative:    Per Rojelio Brenner with [facility], patient will have a hospice visit at home tomorrow a 3 pm. Patient's mother to transport home tomorrow.         Expected Discharge Plan and Services                                                 Social Determinants of Health (SDOH) Interventions    Readmission Risk Interventions     No data to display

## 2022-07-03 NOTE — Plan of Care (Signed)

## 2022-07-04 DIAGNOSIS — R6250 Unspecified lack of expected normal physiological development in childhood: Secondary | ICD-10-CM | POA: Diagnosis not present

## 2022-07-04 DIAGNOSIS — K851 Biliary acute pancreatitis without necrosis or infection: Secondary | ICD-10-CM | POA: Diagnosis not present

## 2022-07-04 DIAGNOSIS — G40319 Generalized idiopathic epilepsy and epileptic syndromes, intractable, without status epilepticus: Secondary | ICD-10-CM | POA: Diagnosis not present

## 2022-07-04 DIAGNOSIS — K8050 Calculus of bile duct without cholangitis or cholecystitis without obstruction: Secondary | ICD-10-CM | POA: Diagnosis not present

## 2022-07-04 LAB — HEPATIC FUNCTION PANEL
ALT: 37 U/L (ref 0–44)
AST: 40 U/L (ref 15–41)
Albumin: 2.2 g/dL — ABNORMAL LOW (ref 3.5–5.0)
Alkaline Phosphatase: 305 U/L — ABNORMAL HIGH (ref 38–126)
Bilirubin, Direct: 3.5 mg/dL — ABNORMAL HIGH (ref 0.0–0.2)
Indirect Bilirubin: 3.1 mg/dL — ABNORMAL HIGH (ref 0.3–0.9)
Total Bilirubin: 6.6 mg/dL — ABNORMAL HIGH (ref 0.3–1.2)
Total Protein: 5 g/dL — ABNORMAL LOW (ref 6.5–8.1)

## 2022-07-04 MED ORDER — ACETAMINOPHEN 325 MG PO TABS: 650.0000 mg | ORAL_TABLET | Freq: Four times a day (QID) | ORAL | Status: AC | PRN

## 2022-07-04 NOTE — Progress Notes (Signed)
Reviewed discharged instructions with pt's mother. Pt's mother verbalized understanding. IV intact when removed. Biliary drainage in place. Emptied before transported to home. Staff wheeled pt out. Pt transported to home via family vehicle.

## 2022-07-04 NOTE — Plan of Care (Signed)
  Problem: Education: Goal: Knowledge of General Education information will improve Description: Including pain rating scale, medication(s)/side effects and non-pharmacologic comfort measures 07/04/2022 1313 by Maurio Baize Bet, LPN Outcome: Adequate for Discharge 07/04/2022 1100 by Aidden Markovic Bet, LPN Outcome: Progressing   Problem: Clinical Measurements: Goal: Ability to maintain clinical measurements within normal limits will improve 07/04/2022 1313 by Dorean Daniello Bet, LPN Outcome: Adequate for Discharge 07/04/2022 1100 by Dovid Bartko Bet, LPN Outcome: Progressing Goal: Will remain free from infection 07/04/2022 1313 by Mckensi Redinger Bet, LPN Outcome: Adequate for Discharge 07/04/2022 1100 by Ezzie Senat Bet, LPN Outcome: Progressing Goal: Diagnostic test results will improve 07/04/2022 1313 by Zyere Jiminez Bet, LPN Outcome: Adequate for Discharge 07/04/2022 1100 by Hildur Bayer Bet, LPN Outcome: Progressing Goal: Respiratory complications will improve 07/04/2022 1313 by Autumnrose Yore Bet, LPN Outcome: Adequate for Discharge 07/04/2022 1100 by Matthe Sloane Bet, LPN Outcome: Progressing Goal: Cardiovascular complication will be avoided 07/04/2022 1313 by Jebidiah Baggerly Bet, LPN Outcome: Adequate for Discharge 07/04/2022 1100 by Finley Chevez Bet, LPN Outcome: Progressing   Problem: Activity: Goal: Risk for activity intolerance will decrease 07/04/2022 1313 by Manar Smalling Bet, LPN Outcome: Adequate for Discharge 07/04/2022 1100 by Sissy Goetzke Bet, LPN Outcome: Progressing   Problem: Nutrition: Goal: Adequate nutrition will be maintained 07/04/2022 1313 by Vallie Teters Bet, LPN Outcome: Adequate for Discharge 07/04/2022 1100 by Coralynn Gaona Bet, LPN Outcome: Progressing   Problem: Coping: Goal: Level of anxiety will decrease 07/04/2022 1313 by Mariadelcarmen Corella Bet, LPN Outcome: Adequate for Discharge 07/04/2022 1100 by Molley Houser Bet, LPN Outcome: Progressing   Problem: Elimination: Goal: Will  not experience complications related to bowel motility 07/04/2022 1313 by Corianne Buccellato Bet, LPN Outcome: Adequate for Discharge 07/04/2022 1100 by Tanekia Ryans Bet, LPN Outcome: Progressing Goal: Will not experience complications related to urinary retention 07/04/2022 1313 by Jaceon Heiberger Bet, LPN Outcome: Adequate for Discharge 07/04/2022 1100 by Veronia Laprise Bet, LPN Outcome: Progressing   Problem: Pain Managment: Goal: General experience of comfort will improve 07/04/2022 1313 by Helma Argyle Bet, LPN Outcome: Adequate for Discharge 07/04/2022 1100 by Shardae Kleinman Bet, LPN Outcome: Progressing   Problem: Safety: Goal: Ability to remain free from injury will improve 07/04/2022 1313 by Quentavious Rittenhouse Bet, LPN Outcome: Adequate for Discharge 07/04/2022 1100 by Jewels Langone Bet, LPN Outcome: Progressing   Problem: Skin Integrity: Goal: Risk for impaired skin integrity will decrease 07/04/2022 1313 by Alliene Klugh Bet, LPN Outcome: Adequate for Discharge 07/04/2022 1100 by Deniss Wormley Bet, LPN Outcome: Progressing

## 2022-07-04 NOTE — Progress Notes (Addendum)
Request received for celiac plexus block for pain control.  Patient and mother were seen at bedside to discuss procedure and risk.  Patient's mother states she would rather see if hospice can control her pain instead of having such an invasive procedure.  She adds if hospice is unable to provide adequate pain control, she will have them contact us for outpatient evaluation for possible celiac plexus block with general anesthesia due to patient's inability to follow commands.  Dr. Doristine Mango and Dr. Aletta Edouard, IR, aware of mother's decision not to do celiac plexus block at this time.    Narda Rutherford, AGNP-BC 07/04/2022, 12:31 PM

## 2022-07-04 NOTE — Care Management Important Message (Signed)
Important Message  Patient Details  Name: Roberta Hampton MRN: [id number] Date of Birth: [birth year]   Medicare Important Message Given:  Other (see comment)  Disposition to discharge with hospice services.  Medicare IM withheld at this time out of respect for patient and family.   Dannette Barbara 07/04/2022, 9:26 AM

## 2022-07-04 NOTE — Plan of Care (Signed)
  Problem: Education: Goal: Knowledge of General Education information will improve Description: Including pain rating scale, medication(s)/side effects and non-pharmacologic comfort measures Outcome: Progressing   Problem: Activity: Goal: Risk for activity intolerance will decrease Outcome: Progressing   Problem: Pain Managment: Goal: General experience of comfort will improve Outcome: Progressing   Problem: Safety: Goal: Ability to remain free from injury will improve Outcome: Progressing   Problem: Skin Integrity: Goal: Risk for impaired skin integrity will decrease Outcome: Progressing   Problem: Elimination: Goal: Will not experience complications related to bowel motility Outcome: Progressing

## 2022-07-04 NOTE — Progress Notes (Signed)
Nutrition Brief Note  Chart reviewed. Pt now transitioning to comfort care. Plan to discharge home with hospice services today.  No further nutrition interventions planned at this time.  Please re-consult as needed.   Loistine Chance, RD, LDN, [place] Registered Dietitian II Certified Diabetes Care and Education Specialist Please refer to [facility] for RD and/or RD on-call/weekend/after hours pager

## 2022-07-04 NOTE — Progress Notes (Signed)
[facility] ([facility])   Roberta Hampton is excited about returning home today.  Spoke with mother, Kermit Balo, and plan remains that she will transport the patient home once discharged.  Hospitalist, Dr. Ouida Sills and following up with IR to see about Roberta Hampton receiving the Celiac Plexus block. AV tentatively scheduled for 3 pm, but can be moved to 7pm is IR can see patient today.  [facility] will continue to follow until discharged.    Please send completed and signed DNR with patient at time of discharge.    Thank you  Kenna Gilbert BSN, RN  [facility] Liaison  [phone]

## 2022-07-04 NOTE — Plan of Care (Signed)

## 2022-07-04 NOTE — Discharge Instructions (Signed)
Continue home medicine and tylenol as needed for pain The drain will stay in place and should have follow up appointment with IR in about 8 weeks to recheck. Please follow up with them to discuss a possible nerve block for pain control as well.  Hospice will continue their care starting with an appointment at [facility] today Please follow up with oncology within a couple weeks.

## 2022-07-28 IMAGING — CT CT CHEST-ABD-PELV W/ CM
2 of 5 series · 13 of 36 positions shown, 15 images · IV contrast (agent unspecified)
Comparison: CT abdomen/pelvis dated 11/20/2017

CLINICAL DATA: Lytic cervical spine lesions, mets or multiple
myeloma

EXAM:
CT CHEST, ABDOMEN, AND PELVIS WITH CONTRAST
TECHNIQUE: Multidetector CT imaging of the chest, abdomen and pelvis was
performed following the standard protocol during bolus
administration of intravenous contrast.

[Series 2: cap with · axial · 0.74mm/px · z∈[-942,-467]mm · 10 of 117 slices shown, 12 images]
[im 11/117  mediastinal]
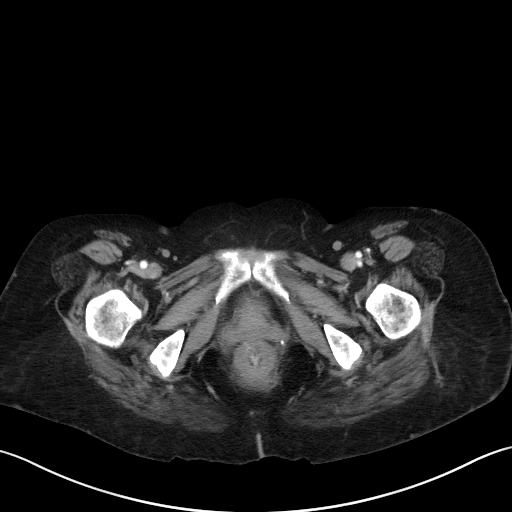
[im 11/117  bone]
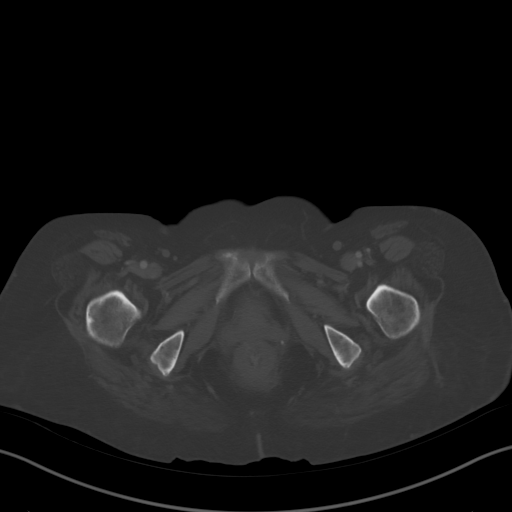
[im 22/117  mediastinal]
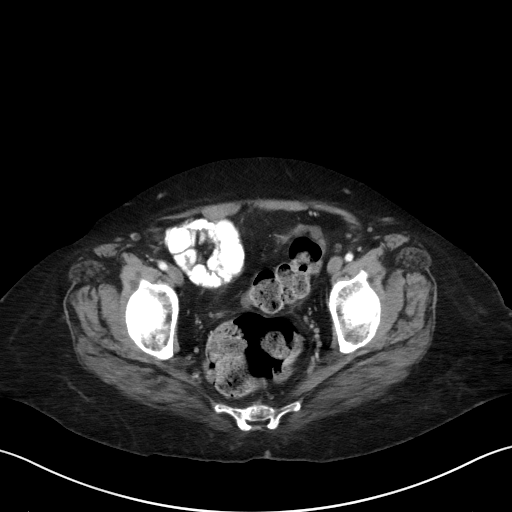
[im 32/117  mediastinal]
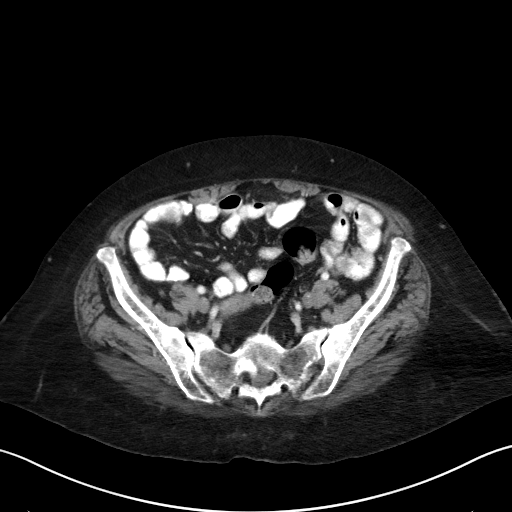
[im 43/117  mediastinal]
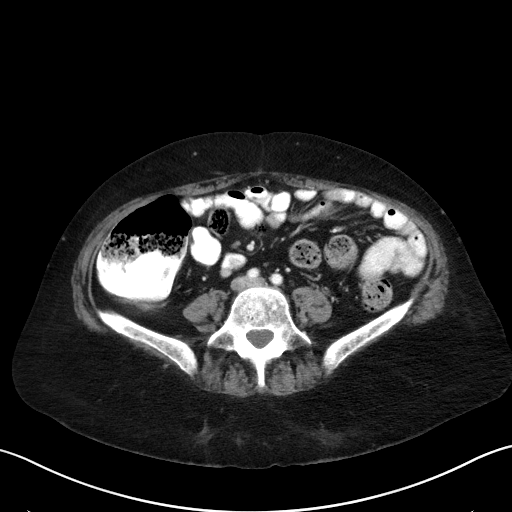
[im 53/117  mediastinal]
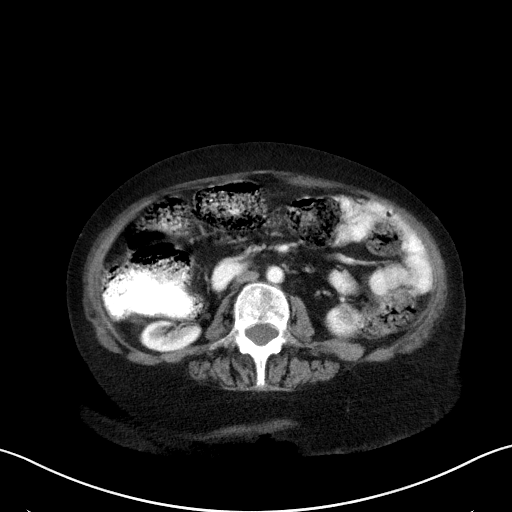
[im 64/117  mediastinal]
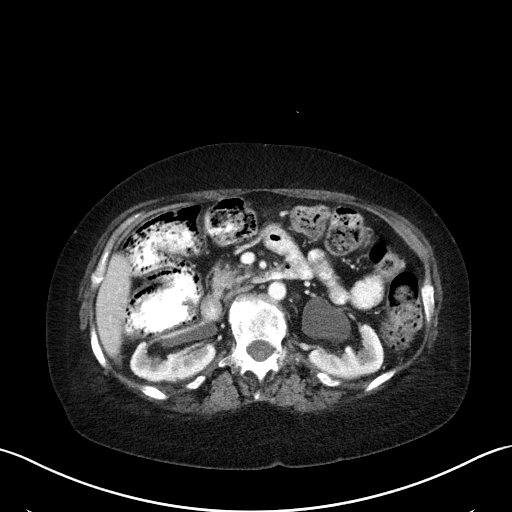
[im 74/117  mediastinal]
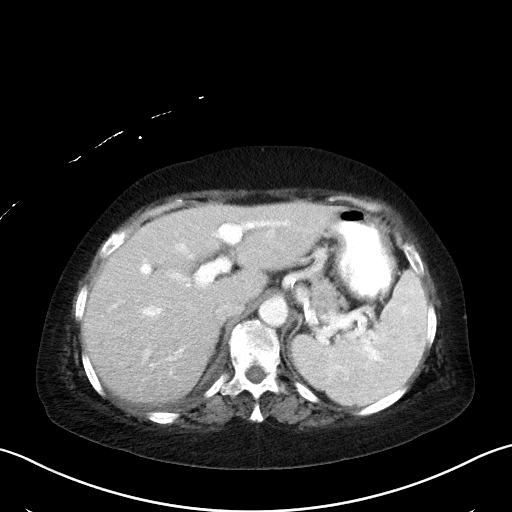
[im 85/117  mediastinal]
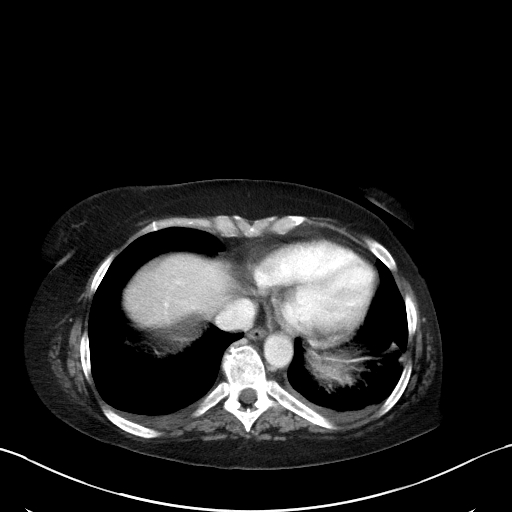
[im 95/117  mediastinal]
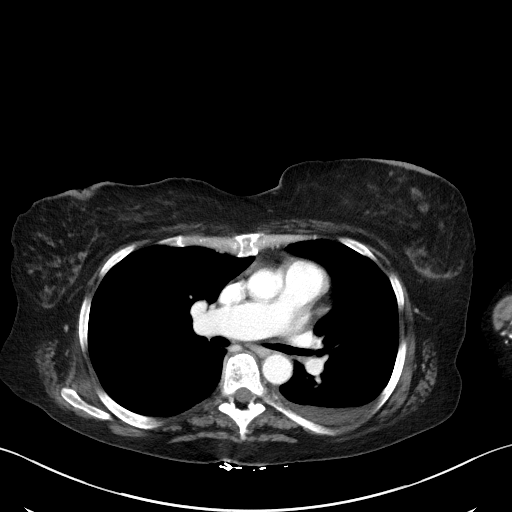
[im 95/117  bone]
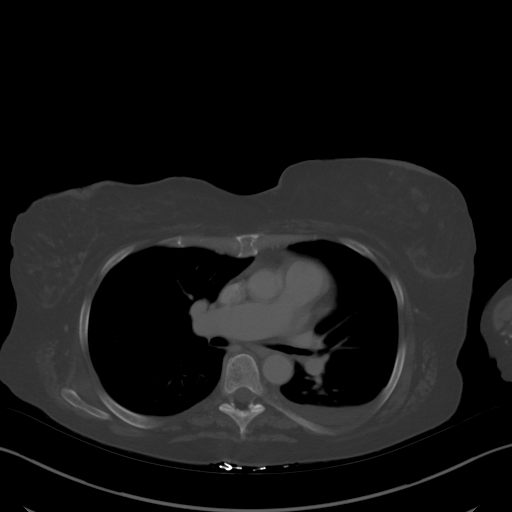
[im 106/117  mediastinal]
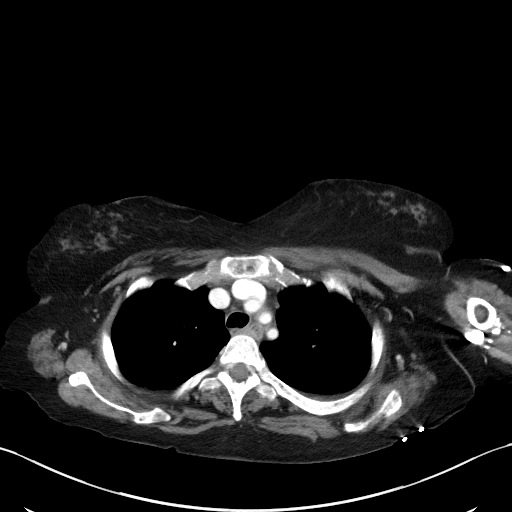

[Series 4: coronals · coronal · 0.67mm/px · 3 of 103 slices shown]
[im 21/103  mediastinal]
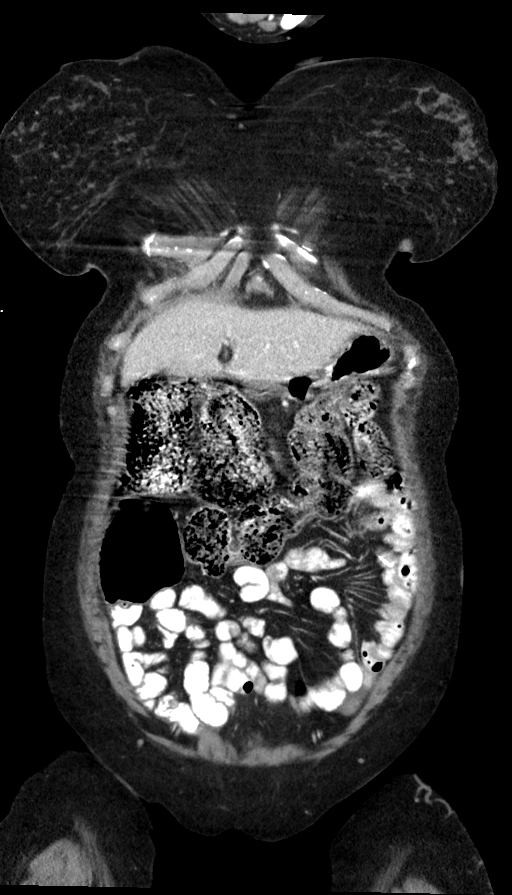
[im 41/103  mediastinal]
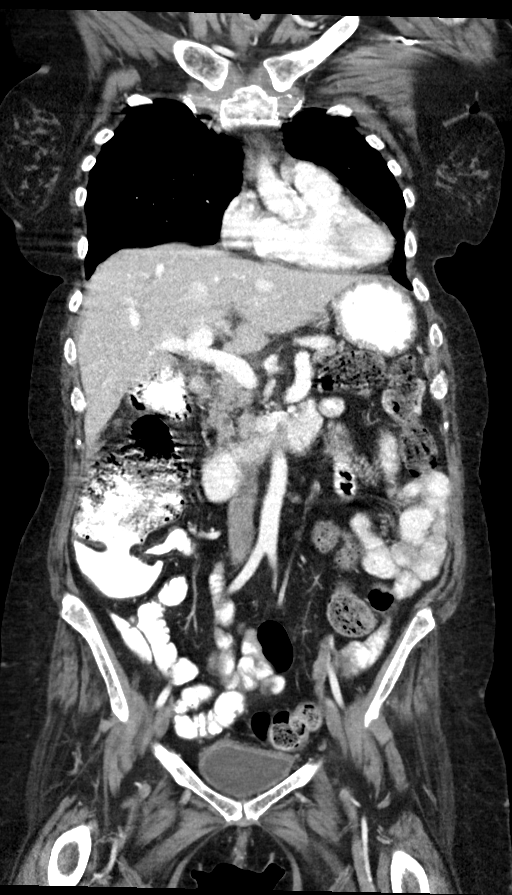
[im 62/103  mediastinal]
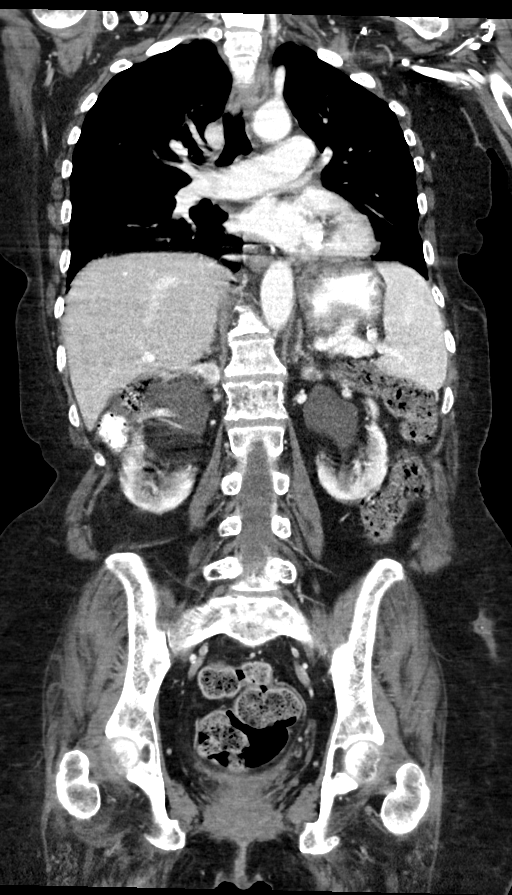

[13 of 36 positions shown; findings below may reference images not displayed]

RADIATION DOSE REDUCTION: This exam was performed according to the
departmental dose-optimization program which includes automated
exposure control, adjustment of the mA and/or kV according to
patient size and/or use of iterative reconstruction technique.

CONTRAST:  100mL OMNIPAQUE IOHEXOL 300 MG/ML  SOLN
FINDINGS: CT CHEST FINDINGS

Cardiovascular: The heart is normal in size. No pericardial
effusion.

No evidence of thoracic aortic aneurysm.

Mediastinum/Nodes: No suspicious mediastinal lymphadenopathy.

Visualized thyroid is unremarkable.

Lungs/Pleura: Evaluation of the lung parenchyma is constrained by
respiratory motion. Within that constraint, there are no suspicious
pulmonary nodules.

Small left and trace right pleural effusions.

Mild linear/patchy opacity at the left lung base (series 8/image
81), likely atelectasis.

No pneumothorax.

Musculoskeletal: Heterogeneous, mottled appearance of the visualized
osseous structures. Small lytic lesions are suspected throughout the
vertebral bodies, favoring a diagnosis of multiple myeloma, although
limited osseous are technically not excluded.

CT ABDOMEN PELVIS FINDINGS

Motion degraded images.

Hepatobiliary: Liver is within normal limits.

Gallbladder is unremarkable. No intrahepatic or extrahepatic ductal
dilatation.

Pancreas: Within normal limits.

Spleen: Multiple hypoenhancing splenic lesions measuring up to
cm (series 2/image 36), incompletely characterized, although
typically benign.

Adrenals/Urinary Tract: Adrenal glands are within normal limits.

Kidneys are within normal limits, noting an extrarenal pelvis
bilaterally. No frank hydronephrosis.

Mildly thick-walled bladder, although underdistended. Trace
nondependent gas.

Stomach/Bowel: Stomach is within normal limits.

No evidence of bowel obstruction.

Normal appendix (series 2/image 39).

Moderate colonic stool burden, suggesting constipation. No colonic
wall thickening or mass is evident on CT.

Vascular/Lymphatic: No evidence of abdominal aortic aneurysm.

No suspicious abdominopelvic lymphadenopathy.

Reproductive: Status post hysterectomy.

Bilateral ovaries are within normal limits.

Other: No abdominopelvic ascites.

Musculoskeletal: Heterogeneous, moderate appearance of the
visualized osseous structures. As noted above, multiple lytic
lesions are suspected, favoring multiple myeloma over lytic
metastases.
IMPRESSION: Multiple lytic osseous lesions, favoring multiple myeloma over lytic
metastases.

No findings specific for primary malignancy in the chest, abdomen,
or pelvis.

Multiple low-density splenic lesions, incompletely characterized,
although typically benign. Benign splenic lesions such as
lymphangioma as and hemangiomas are most common. Lymphoma can
technically have this appearance but would be unlikely given lack of
supporting findings. Consider follow-up CT in 3-6 months.

Small bilateral pleural effusions, left greater than right.

## 2022-09-05 DEATH — deceased

## 2022-09-14 IMAGING — PT NM PET TUM IMG INITIAL (PI) WHOLE BODY
6 series · 25 of 25 positions shown · non-contrast
Comparison: None Available.

CLINICAL DATA: Subsequent treatment strategy for tumor type.

EXAM:
NUCLEAR MEDICINE PET WHOLE BODY
TECHNIQUE: 7.0 mCi F-18 FDG was injected intravenously. Full-ring PET imaging
was performed from the head to foot after the radiotracer. CT data
was obtained and used for attenuation correction and anatomic
localization.
Fasting blood glucose: 66 mg/dl

[Series 2: ct slices · axial · 3.8mm · 1.37mm/px · z∈[-871,-14]mm · 6 of 262 slices shown]
[im 1/262  soft-tissue]
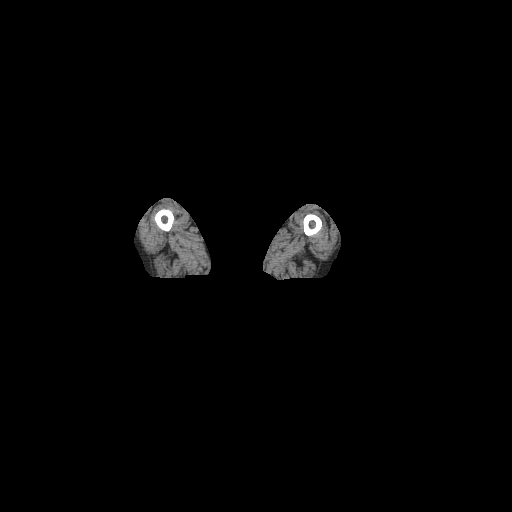
[im 53/262  soft-tissue]
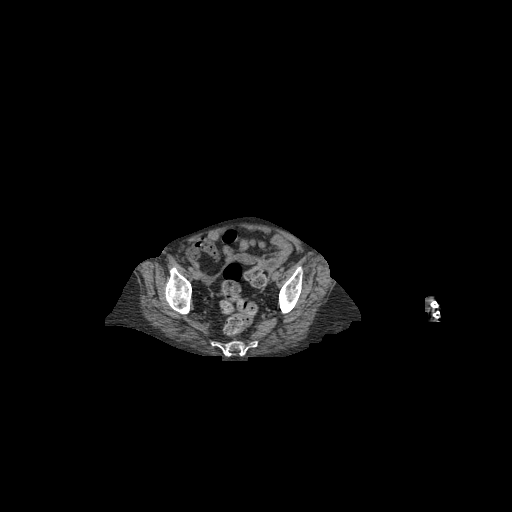
[im 105/262  soft-tissue]
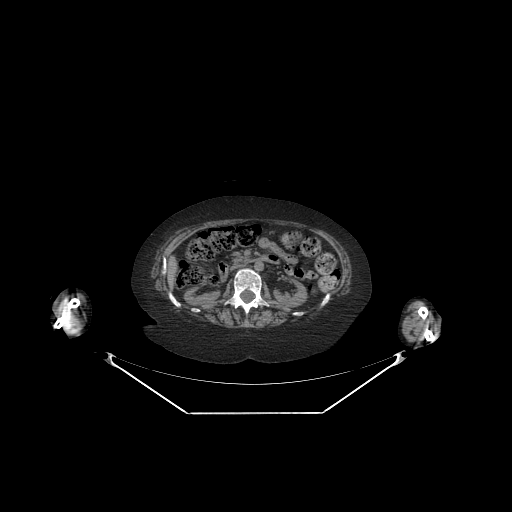
[im 157/262  soft-tissue]
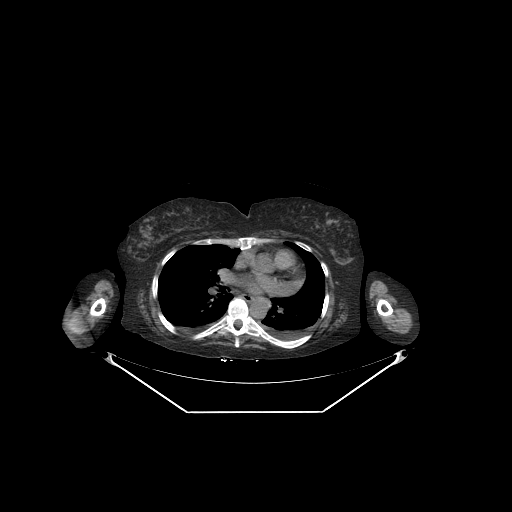
[im 209/262  soft-tissue]
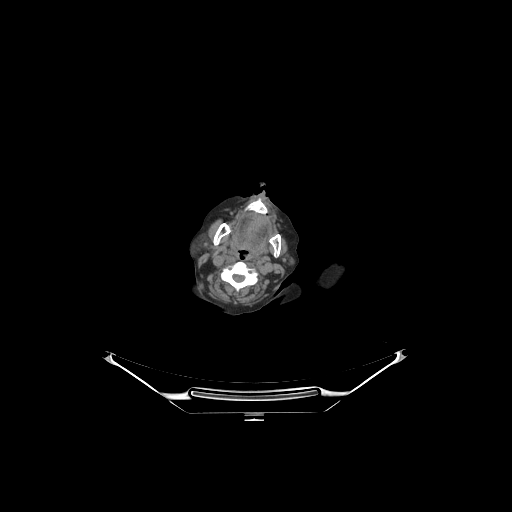
[im 262/262  soft-tissue]
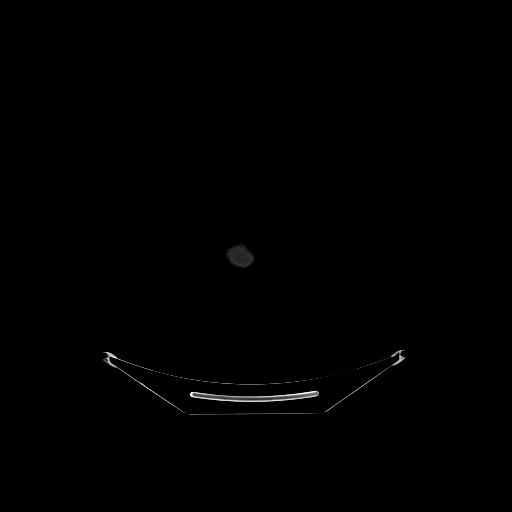

[Series 3: pet ac 3d body · axial · 3.3mm · 5.47mm/px · z∈[-871,-14]mm · 6 of 263 slices shown]
[im 1/263]
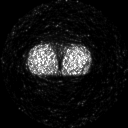
[im 53/263]
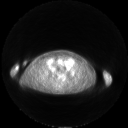
[im 105/263]
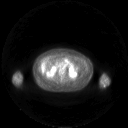
[im 158/263]
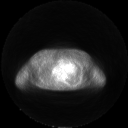
[im 210/263]
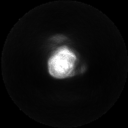
[im 263/263]
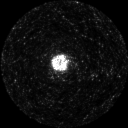

[Series 4: pet nac 3d body · axial · 3.3mm · 5.47mm/px · z∈[-871,-14]mm · 5 of 263 slices shown]
[im 1/263]
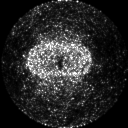
[im 66/263]
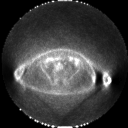
[im 132/263]
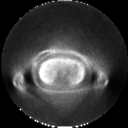
[im 197/263]
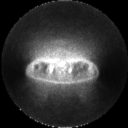
[im 263/263]
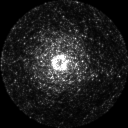

[Series 303: pet axial · axial · 3.3mm · 5.47mm/px · z∈[-871,-14]mm · 5 of 263 slices shown]
[im 1/263]
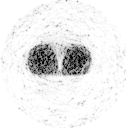
[im 66/263]
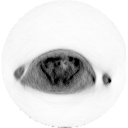
[im 132/263]
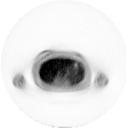
[im 197/263]
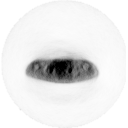
[im 263/263]
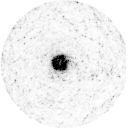

[Series 304: pet sagittal · sagittal · 5.5mm · 6.88mm/px · 2 of 106 slices shown]
[im 1/106]
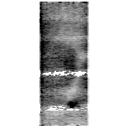
[im 106/106]
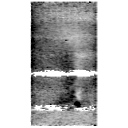

[Series 305: pet coronal · coronal · 5.5mm · 6.88mm/px · 1 of 61 slices shown]
[im 1/61]
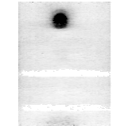

[25 of 25 positions shown; findings below may reference images not displayed]

FINDINGS: Mediastinal blood pool activity: SUV max

HEAD/NECK: No hypermetabolic activity in the scalp. No
hypermetabolic cervical lymph nodes.

Incidental CT findings: none

CHEST: No hypermetabolic mediastinal or hilar nodes. No suspicious
pulmonary nodules on the CT scan.

Incidental CT findings: Small bilateral pleural effusions

ABDOMEN/PELVIS: No abnormal hypermetabolic activity within the
liver, pancreas, adrenal glands, or spleen. No hypermetabolic lymph
nodes in the abdomen or pelvis.

Incidental CT findings: none

SKELETON: No focal activity within the skeleton to suggest active
multiple myeloma.

Incidental CT findings: Small lytic lesions described on comparison
CT or subtly evident. Favor osteopenia or degenerative change.

EXTREMITIES: No abnormal hypermetabolic activity in the lower
extremities.

Incidental CT findings: No suspicious lytic lesions.
IMPRESSION: 1. No foci of metabolic activity within the skeleton suggest active
multiple myeloma or metastatic skeletal disease.
2. Small lytic lesions within the spine ribs in pelvis differential
including smoldering myeloma versus osteoporosis or degenerative
disease or medical renal disease. Favor non myeloma.
3. No plasmacytoma.
4. Small bilateral pleural effusions

.

## 2022-10-21 ENCOUNTER — Ambulatory Visit: Payer: Medicare Other | Admitting: Adult Health

## 2022-10-24 ENCOUNTER — Other Ambulatory Visit: Payer: Medicare Other

## 2022-10-24 ENCOUNTER — Ambulatory Visit: Payer: Medicare Other | Admitting: Internal Medicine

## 2023-05-19 ENCOUNTER — Ambulatory Visit: Payer: Medicare Other | Admitting: Urology
